# Patient Record
Sex: Female | Born: 1995 | Race: Black or African American | Hispanic: No | Marital: Single | Smoking: Never smoker
Health system: Southern US, Community
[De-identification: ages and names within clinical notes are randomized; demographics above are authoritative.]

## PROBLEM LIST (undated history)

## (undated) ENCOUNTER — Inpatient Hospital Stay (HOSPITAL_COMMUNITY): Payer: Self-pay

## (undated) DIAGNOSIS — Z789 Other specified health status: Secondary | ICD-10-CM

## (undated) DIAGNOSIS — I10 Essential (primary) hypertension: Secondary | ICD-10-CM

## (undated) DIAGNOSIS — T783XXA Angioneurotic edema, initial encounter: Secondary | ICD-10-CM

## (undated) HISTORY — DX: Angioneurotic edema, initial encounter: T78.3XXA

## (undated) HISTORY — PX: ADENOIDECTOMY: SUR15

## (undated) HISTORY — PX: TYMPANOSTOMY TUBE PLACEMENT: SHX32

## (undated) HISTORY — PX: TONSILLECTOMY: SUR1361

## (undated) HISTORY — DX: Essential (primary) hypertension: I10

---
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
## 2015-03-13 LAB — OB RESULTS CONSOLE ANTIBODY SCREEN: Antibody Screen: NEGATIVE

## 2015-03-13 LAB — OB RESULTS CONSOLE RUBELLA ANTIBODY, IGM: RUBELLA: IMMUNE

## 2015-03-13 LAB — OB RESULTS CONSOLE ABO/RH: RH TYPE: POSITIVE

## 2015-03-13 LAB — OB RESULTS CONSOLE HEPATITIS B SURFACE ANTIGEN: Hepatitis B Surface Ag: NEGATIVE

## 2015-03-13 LAB — OB RESULTS CONSOLE HIV ANTIBODY (ROUTINE TESTING): HIV: NONREACTIVE

## 2015-03-13 LAB — OB RESULTS CONSOLE RPR: RPR: NONREACTIVE

## 2015-03-16 ENCOUNTER — Inpatient Hospital Stay (HOSPITAL_COMMUNITY): Payer: Medicaid Other

## 2015-03-16 ENCOUNTER — Encounter (HOSPITAL_COMMUNITY): Payer: Self-pay | Admitting: *Deleted

## 2015-03-16 ENCOUNTER — Inpatient Hospital Stay (HOSPITAL_COMMUNITY)
Admission: AD | Admit: 2015-03-16 | Discharge: 2015-03-16 | Disposition: A | Discharge status: Home / Self Care | Payer: Medicaid Other | Source: Ambulatory Visit | Attending: Obstetrics | Admitting: Obstetrics

## 2015-03-16 DIAGNOSIS — B9689 Other specified bacterial agents as the cause of diseases classified elsewhere: Secondary | ICD-10-CM | POA: Diagnosis not present

## 2015-03-16 DIAGNOSIS — O23591 Infection of other part of genital tract in pregnancy, first trimester: Secondary | ICD-10-CM | POA: Insufficient documentation

## 2015-03-16 DIAGNOSIS — O468X1 Other antepartum hemorrhage, first trimester: Secondary | ICD-10-CM

## 2015-03-16 DIAGNOSIS — O209 Hemorrhage in early pregnancy, unspecified: Secondary | ICD-10-CM | POA: Insufficient documentation

## 2015-03-16 DIAGNOSIS — N760 Acute vaginitis: Secondary | ICD-10-CM | POA: Insufficient documentation

## 2015-03-16 DIAGNOSIS — Z3A13 13 weeks gestation of pregnancy: Secondary | ICD-10-CM | POA: Diagnosis not present

## 2015-03-16 DIAGNOSIS — O418X10 Other specified disorders of amniotic fluid and membranes, first trimester, not applicable or unspecified: Secondary | ICD-10-CM

## 2015-03-16 HISTORY — DX: Other specified health status: Z78.9

## 2015-03-16 LAB — CBC
HCT: 36 % (ref 36.0–46.0)
HEMOGLOBIN: 11.7 g/dL — AB (ref 12.0–15.0)
MCH: 23.4 pg — AB (ref 26.0–34.0)
MCHC: 32.5 g/dL (ref 30.0–36.0)
MCV: 71.9 fL — ABNORMAL LOW (ref 78.0–100.0)
PLATELETS: 157 10*3/uL (ref 150–400)
RBC: 5.01 MIL/uL (ref 3.87–5.11)
RDW: 15.8 % — ABNORMAL HIGH (ref 11.5–15.5)
WBC: 6.1 10*3/uL (ref 4.0–10.5)

## 2015-03-16 LAB — URINE MICROSCOPIC-ADD ON

## 2015-03-16 LAB — ABO/RH: ABO/RH(D): O POS

## 2015-03-16 LAB — WET PREP, GENITAL
TRICH WET PREP: NONE SEEN
Yeast Wet Prep HPF POC: NONE SEEN

## 2015-03-16 LAB — URINALYSIS, ROUTINE W REFLEX MICROSCOPIC
Bilirubin Urine: NEGATIVE
GLUCOSE, UA: NEGATIVE mg/dL
Ketones, ur: 15 mg/dL — AB
Leukocytes, UA: NEGATIVE
NITRITE: NEGATIVE
Protein, ur: NEGATIVE mg/dL
Specific Gravity, Urine: 1.015 (ref 1.005–1.030)
Urobilinogen, UA: 4 mg/dL — ABNORMAL HIGH (ref 0.0–1.0)
pH: 7.5 (ref 5.0–8.0)

## 2015-03-16 LAB — OB RESULTS CONSOLE GC/CHLAMYDIA: GC PROBE AMP, GENITAL: NEGATIVE

## 2015-03-16 MED ORDER — METRONIDAZOLE 500 MG PO TABS: 500.0000 mg | ORAL_TABLET | Freq: Two times a day (BID) | ORAL | Status: DC

## 2015-03-16 NOTE — MAU Note (Signed)
Pt presents to MAU with complaints of having vaginal bleeding two days ago. Denies any bleeding at present. Denies any pain

## 2015-03-16 NOTE — MAU Provider Note (Signed)
Chief Complaint: Vaginal Bleeding   First Provider Initiated Contact with Patient 03/16/15 1025     SUBJECTIVE HPI: Debra Lucas is a 19 y.o. G1P0 at 5268w1d who presents to [facility] reporting episode of bright red bleeding 2 days ago and still minor bleeding since then. States she passed a small clot today. Getting prenatal care with Dr. Elsie StainMarshall's office. States she has not had a pelvic exam. Has had blood drawn but doesn't know her blood type. No ultrasounds this pregnancy. No recent intercourse.  Location: Vaginal Duration: 2 days Course: Improving spontaneously Context: None Modifying factors: None Associated signs and symptoms: Positive for vaginal discharge and passage of small clots. Negative for fever, chills, passage of tissue or abdominal pain.  Past Medical History  Diagnosis Date  . Medical history non-contributory    OB History  Gravida Para Term Preterm AB SAB TAB Ectopic Multiple Living  1             # Outcome Date GA Lbr Len/2nd Weight Sex Delivery Anes PTL Lv  1 Current              Past Surgical History  Procedure Laterality Date  . Adenoidectomy     Social History   Social History  . Marital Status: Single    Spouse Name: N/A  . Number of Children: N/A  . Years of Education: N/A   Occupational History  . Not on file.   Social History Main Topics  . Smoking status: Passive Smoke Exposure - Never Smoker  . Smokeless tobacco: Not on file  . Alcohol Use: No  . Drug Use: No  . Sexual Activity: Not on file   Other Topics Concern  . Not on file   Social History Narrative   No current facility-administered medications on file prior to encounter.   No current outpatient prescriptions on file prior to encounter.   Allergies  Allergen Reactions  . Shellfish Allergy Swelling and Rash    I have reviewed the past Medical Hx, Surgical Hx, Social Hx, Allergies and Medications.   Review of Systems  Constitutional: Negative for fever  and chills.  Gastrointestinal: Negative for nausea, vomiting, abdominal pain, diarrhea, constipation and blood in stool.  Genitourinary: Positive for vaginal bleeding and vaginal discharge. Negative for dysuria, frequency, hematuria, flank pain, genital sores, vaginal pain and pelvic pain.  Musculoskeletal: Negative for back pain.    OBJECTIVE Patient Vitals for the past 24 hrs:  BP Temp Pulse Resp Height Weight  03/16/15 0956 119/67 mmHg 98.4 F (36.9 C) 105 18 - -  03/16/15 0955 - 98.4 F (36.9 C) - - 5\' 4"  (1.626 m) 148 lb (67.132 kg)   Constitutional: Well-developed, well-nourished female in no acute distress.  Cardiovascular: normal rate Respiratory: normal rate and effort.  GI: Abd soft, non-tender, gravid appropriate for gestational age.  MS: Extremities nontender, no edema, normal ROM Neurologic: Alert and oriented x 4.  GU: Neg CVAT.  SPECULUM EXAM: NEFG, small amount of tan, malodorous discharge noted, cervix clean, friable with exam  BIMANUAL: cervix closed; uterus 13-14 week size, no adnexal tenderness or masses. No CMT.  LAB RESULTS Results for orders placed or performed during the hospital encounter of 03/16/15 (from the past 24 hour(s))  ABO/Rh     Status: None (Preliminary result)   Collection Time: 03/16/15 10:30 AM  Result Value Ref Range   ABO/RH(D) O POS   CBC     Status: Abnormal   Collection Time: 03/16/15 10:30  AM  Result Value Ref Range   WBC 6.1 4.0 - 10.5 K/uL   RBC 5.01 3.87 - 5.11 MIL/uL   Hemoglobin 11.7 (L) 12.0 - 15.0 g/dL   HCT 16.1 09.6 - 04.5 %   MCV 71.9 (L) 78.0 - 100.0 fL   MCH 23.4 (L) 26.0 - 34.0 pg   MCHC 32.5 30.0 - 36.0 g/dL   RDW 40.9 (H) 81.1 - 91.4 %   Platelets 157 150 - 400 K/uL  Wet prep, genital     Status: Abnormal   Collection Time: 03/16/15 10:38 AM  Result Value Ref Range   Yeast Wet Prep HPF POC NONE SEEN NONE SEEN   Trich, Wet Prep NONE SEEN NONE SEEN   Clue Cells Wet Prep HPF POC FEW (A) NONE SEEN   WBC, Wet Prep  HPF POC FEW (A) NONE SEEN    IMAGING US Ob Comp Less 14 Wks  03/16/2015  CLINICAL DATA:  19 year old pregnant female with recent episode of vaginal bleeding. EDC by LMP: 09/20/2015, projecting to an expected gestational age of [redacted] weeks 1 day. EXAM: OBSTETRIC <14 WK ULTRASOUND TECHNIQUE: Transabdominal ultrasound was performed for evaluation of the gestation as well as the maternal uterus and adnexal regions. COMPARISON:  None. FINDINGS: Intrauterine gestational sac: Single intrauterine gestational sac appears normal in size, shape and position. There is a tiny perigestational bleed in the posterior lower cavity involving less than 20% of the gestational sac circumference. Yolk sac:  Not visualized. Fetus: Present. Fetal anatomy not assessed on this early gestational scan. No gross abnormality demonstrated. Cardiac Activity: Regular rate and rhythm. Heart Rate: 136 bpm CRL:   68  mm   13 w 1 d                  [place] EDC: 09/20/2015 Maternal uterus/adnexae: No adnexal abnormality demonstrated. Maternal ovaries are not visualized. No abnormal free fluid in the pelvis. IMPRESSION: 1. Single living intrauterine gestation at 13 weeks 1 day by crown-rump length, concordant with provided menstrual dating. 2. Normal fetal cardiac activity. Fetal anatomy not assessed on this early gestational scan. 3. Tiny perigestational bleed in the posterior lower cavity. 4. No maternal adnexal abnormality. 5. The patient should obtain a fetal anatomic survey in 5-8 weeks, at which time fetal growth will also be evaluated. Electronically Signed   By: Delbert Phenix M.D.   On: 03/16/2015 11:34    MAU COURSE CBC, ABO Rh, wet prep, GC/chlamydia cultures, ultrasound.  MDM 19 year old female at 31 weeks 1 day gestation with episode of vaginal bleeding. Minimal bleeding now. Ultrasound shows subchorionic hemorrhage. Cervix also friable on exam. Bleeding may be caused by one or both of those, but is now stable. Wet prep and exam  consistent with BV.   ASSESSMENT 1. Subchorionic hemorrhage in first trimester   2. Vaginal bleeding in pregnancy, first trimester   3. BV (bacterial vaginosis)     PLAN Discharge home in stable condition. Bleeding Precautions Pelvic rest 1 week. Rx Flagyl     Follow-up Information    Follow up with Kathreen Cosier, MD.   Specialty:  Obstetrics and Gynecology   Why:  Routine prenatal visit as scheduled   Contact information:   [address] [place] [address] [phone]       Follow up with [facility].   Why:  As needed in emergencies   Contact information:   [address] [id number] [address]  [place] [address] [phone]       Medication List    TAKE these medications        metroNIDAZOLE 500 MG tablet  Commonly known as:  FLAGYL  Take 1 tablet (500 mg total) by mouth 2 (two) times daily.     prenatal multivitamin Tabs tablet  Take 1 tablet by mouth daily at 12 noon.     promethazine 25 MG tablet  Commonly known as:  PHENERGAN  Take 25 mg by mouth every 6 (six) hours as needed for nausea or vomiting.       [place], CNM 03/16/2015  11:57 AM

## 2015-03-16 NOTE — Discharge Instructions (Signed)
Your blood type is O+  Subchorionic Hematoma A subchorionic hematoma is a gathering of blood between the outer wall of the placenta and the inner wall of the womb (uterus). The placenta is the organ that connects the fetus to the wall of the uterus. The placenta performs the feeding, breathing (oxygen to the fetus), and waste removal (excretory work) of the fetus.  Subchorionic hematoma is the most common abnormality found on a result from ultrasonography done during the first trimester or early second trimester of pregnancy. If there has been little or no vaginal bleeding, early small hematomas usually shrink on their own and do not affect your baby or pregnancy. The blood is gradually absorbed over 1-2 weeks. When bleeding starts later in pregnancy or the hematoma is larger or occurs in an older pregnant woman, the outcome may not be as good. Larger hematomas may get bigger, which increases the chances for miscarriage. Subchorionic hematoma also increases the risk of premature detachment of the placenta from the uterus, preterm (premature) labor, and stillbirth. HOME CARE INSTRUCTIONS  Stay on bed rest if your health care provider recommends this. Although bed rest will not prevent more bleeding or prevent a miscarriage, your health care provider may recommend bed rest until you are advised otherwise.  Avoid heavy lifting (more than 10 lb [4.5 kg]), exercise, sexual intercourse, or douching as directed by your health care provider.  Keep track of the number of pads you use each day and how soaked (saturated) they are. Write down this information.  Do not use tampons.  Keep all follow-up appointments as directed by your health care provider. Your health care provider may ask you to have follow-up blood tests or ultrasound tests or both. SEEK IMMEDIATE MEDICAL CARE IF:  You have severe cramps in your stomach, back, abdomen, or pelvis.  You have a fever.  You pass large clots or tissue. Save any  tissue for your health care provider to look at.  Your bleeding increases or you become lightheaded, feel weak, or have fainting episodes.   This information is not intended to replace advice given to you by your health care provider. Make sure you discuss any questions you have with your health care provider.   Document Released: 08/21/2006 Document Revised: 05/27/2014 Document Reviewed: 12/03/2012 Elsevier Interactive Patient Education 2016 Elsevier Inc.  Pelvic Rest Pelvic rest is sometimes recommended for women when:   The placenta is partially or completely covering the opening of the cervix (placenta previa).  There is bleeding between the uterine wall and the amniotic sac in the first trimester (subchorionic hemorrhage).  The cervix begins to open without labor starting (incompetent cervix, cervical insufficiency).  The labor is too early (preterm labor). HOME CARE INSTRUCTIONS  Do not have sexual intercourse, stimulation, or an orgasm.  Do not use tampons, douche, or put anything in the vagina.  Do not lift anything over 10 pounds (4.5 kg).  Avoid strenuous activity or straining your pelvic muscles. SEEK MEDICAL CARE IF:  You have any vaginal bleeding during pregnancy. Treat this as a potential emergency.  You have cramping pain felt low in the stomach (stronger than menstrual cramps).  You notice vaginal discharge (watery, mucus, or bloody).  You have a low, dull backache.  There are regular contractions or uterine tightening. SEEK IMMEDIATE MEDICAL CARE IF: You have vaginal bleeding and have placenta previa.    This information is not intended to replace advice given to you by your health care provider. Make sure  you discuss any questions you have with your health care provider.   Document Released: 08/31/2010 Document Revised: 07/29/2011 Document Reviewed: 11/07/2014 Elsevier Interactive Patient Education 2016 Elsevier Inc.  Bacterial Vaginosis Bacterial  vaginosis is a vaginal infection that occurs when the normal balance of bacteria in the vagina is disrupted. It results from an overgrowth of certain bacteria. This is the most common vaginal infection in women of childbearing age. Treatment is important to prevent complications, especially in pregnant women, as it can cause a premature delivery. CAUSES  Bacterial vaginosis is caused by an increase in harmful bacteria that are normally present in smaller amounts in the vagina. Several different kinds of bacteria can cause bacterial vaginosis. However, the reason that the condition develops is not fully understood. RISK FACTORS Certain activities or behaviors can put you at an increased risk of developing bacterial vaginosis, including:  Having a new sex partner or multiple sex partners.  Douching.  Using an intrauterine device (IUD) for contraception. Women do not get bacterial vaginosis from toilet seats, bedding, swimming pools, or contact with objects around them. SIGNS AND SYMPTOMS  Some women with bacterial vaginosis have no signs or symptoms. Common symptoms include:  Grey vaginal discharge.  A fishlike odor with discharge, especially after sexual intercourse.  Itching or burning of the vagina and vulva.  Burning or pain with urination. DIAGNOSIS  Your health care provider will take a medical history and examine the vagina for signs of bacterial vaginosis. A sample of vaginal fluid may be taken. Your health care provider will look at this sample under a microscope to check for bacteria and abnormal cells. A vaginal pH test may also be done.  TREATMENT  Bacterial vaginosis may be treated with antibiotic medicines. These may be given in the form of a pill or a vaginal cream. A second round of antibiotics may be prescribed if the condition comes back after treatment. Because bacterial vaginosis increases your risk for sexually transmitted diseases, getting treated can help reduce your  risk for chlamydia, gonorrhea, HIV, and herpes. HOME CARE INSTRUCTIONS   Only take over-the-counter or prescription medicines as directed by your health care provider.  If antibiotic medicine was prescribed, take it as directed. Make sure you finish it even if you start to feel better.  Tell all sexual partners that you have a vaginal infection. They should see their health care provider and be treated if they have problems, such as a mild rash or itching.  During treatment, it is important that you follow these instructions:  Avoid sexual activity or use condoms correctly.  Do not douche.  Avoid alcohol as directed by your health care provider.  Avoid breastfeeding as directed by your health care provider. SEEK MEDICAL CARE IF:   Your symptoms are not improving after 3 days of treatment.  You have increased discharge or pain.  You have a fever. MAKE SURE YOU:   Understand these instructions.  Will watch your condition.  Will get help right away if you are not doing well or get worse. FOR MORE INFORMATION  Centers for Disease Control and Prevention, Division of STD Prevention: [employer]za American Sexual Health Association (ASHA): www.ashastd.org    This information is not intended to replace advice given to you by your health care provider. Make sure you discuss any questions you have with your health care provider.   Document Released: 05/06/2005 Document Revised: 05/27/2014 Document Reviewed: 12/16/2012 Elsevier Interactive Patient Education [employer]

## 2015-03-17 LAB — GC/CHLAMYDIA PROBE AMP (~~LOC~~) NOT AT ARMC
CHLAMYDIA, DNA PROBE: NEGATIVE
Neisseria Gonorrhea: NEGATIVE

## 2015-05-21 DIAGNOSIS — O09299 Supervision of pregnancy with other poor reproductive or obstetric history, unspecified trimester: Secondary | ICD-10-CM

## 2015-05-21 HISTORY — DX: Supervision of pregnancy with other poor reproductive or obstetric history, unspecified trimester: O09.299

## 2015-05-21 NOTE — L&D Delivery Note (Addendum)
Delivery Note At 8:39 AM a viable female was delivered via Vaginal, Spontaneous Delivery (Presentation: ; Occiput Anterior).  APGAR: , ; weight  .   Placenta status: Intact, Spontaneous.  Cord: 3 vessels with the following complications: None.  Cord pH: not done  Anesthesia: Epidural  Episiotomy: None Lacerations:   Suture Repair: Est. Blood Loss (mL):    Mom to postpartum.  Baby to Couplet care / Skin to Skin.  MARSHALL,BERNARD A 09/19/2015, 8:49 AM   No suture mentioned there was no laceration

## 2015-06-03 ENCOUNTER — Emergency Department (HOSPITAL_COMMUNITY)
Admission: EM | Admit: 2015-06-03 | Discharge: 2015-06-03 | Disposition: A | Discharge status: Home / Self Care | Payer: Medicaid Other | Attending: Emergency Medicine | Admitting: Emergency Medicine

## 2015-06-03 ENCOUNTER — Encounter (HOSPITAL_COMMUNITY): Payer: Self-pay

## 2015-06-03 DIAGNOSIS — K029 Dental caries, unspecified: Secondary | ICD-10-CM | POA: Insufficient documentation

## 2015-06-03 DIAGNOSIS — O99612 Diseases of the digestive system complicating pregnancy, second trimester: Secondary | ICD-10-CM | POA: Diagnosis not present

## 2015-06-03 DIAGNOSIS — Z792 Long term (current) use of antibiotics: Secondary | ICD-10-CM | POA: Diagnosis not present

## 2015-06-03 DIAGNOSIS — Z3A24 24 weeks gestation of pregnancy: Secondary | ICD-10-CM | POA: Insufficient documentation

## 2015-06-03 DIAGNOSIS — K047 Periapical abscess without sinus: Secondary | ICD-10-CM

## 2015-06-03 DIAGNOSIS — O9989 Other specified diseases and conditions complicating pregnancy, childbirth and the puerperium: Secondary | ICD-10-CM | POA: Diagnosis present

## 2015-06-03 DIAGNOSIS — Z79899 Other long term (current) drug therapy: Secondary | ICD-10-CM | POA: Insufficient documentation

## 2015-06-03 MED ORDER — HYDROCODONE-ACETAMINOPHEN 5-325 MG PO TABS
1.0000 | ORAL_TABLET | Freq: Once | ORAL | Status: AC
  Administered 2015-06-03: 1 via ORAL
  Filled 2015-06-03: qty 1

## 2015-06-03 MED ORDER — HYDROCODONE-ACETAMINOPHEN 5-325 MG PO TABS: 1.0000 | ORAL_TABLET | Freq: Three times a day (TID) | ORAL | Status: DC | PRN

## 2015-06-03 MED ORDER — AMOXICILLIN 500 MG PO CAPS: 500.0000 mg | ORAL_CAPSULE | Freq: Three times a day (TID) | ORAL | Status: DC

## 2015-06-03 MED ORDER — AMOXICILLIN 500 MG PO CAPS
500.0000 mg | ORAL_CAPSULE | Freq: Once | ORAL | Status: AC
  Administered 2015-06-03: 500 mg via ORAL
  Filled 2015-06-03: qty 1

## 2015-06-03 NOTE — ED Notes (Addendum)
Pt reports dental pain to left upper tooth, parts of it are broken off. Pain onset yesterday after eating. Pt is [redacted] weeks pregnant but denies any problems with pregnancy and denies abdominal pain.

## 2015-06-03 NOTE — ED Notes (Signed)
OB Rapid response aware of pt.

## 2015-06-03 NOTE — ED Provider Notes (Signed)
CSN: [id number]     Arrival date & time 06/03/15  2117 History   First MD Initiated Contact with Patient 06/03/15 2126     Chief Complaint  Patient presents with  . Dental Pain     (Consider location/radiation/quality/duration/timing/severity/associated sxs/prior Treatment) Patient is a 20 y.o. female presenting with tooth pain. The history is provided by the patient. No language interpreter was used.  Dental Pain Location:  Lower Lower teeth location:  19/LL 1st molar Quality:  Throbbing Severity:  Severe Onset quality:  Gradual Duration:  2 days Timing:  Constant Progression:  Worsening Chronicity:  New Context: abscess and dental caries   Relieved by:  Nothing Worsened by:  Cold food/drink Ineffective treatments:  None tried Associated symptoms: facial pain, facial swelling and gum swelling    Debra Lucas is a 20 y.o. G1P0 @ [redacted]w[redacted]d gestation who presents to the ED with dental pain. The pain in in the left lower dental area. The tooth is broken. Pain started yesterday after eating. Patient getting prenatal care with Dr. Gaynell Face. Patient denies any OB problems.   Past Medical History  Diagnosis Date  . Medical history non-contributory    Past Surgical History  Procedure Laterality Date  . Adenoidectomy     No family history on file. Social History  Substance Use Topics  . Smoking status: Passive Smoke Exposure - Never Smoker  . Smokeless tobacco: None  . Alcohol Use: No   OB History    Gravida Para Term Preterm AB TAB SAB Ectopic Multiple Living   1              Review of Systems  HENT: Positive for dental problem and facial swelling.   Genitourinary:       [redacted] weeks gestation  all other systems negative    Allergies  Shellfish allergy  Home Medications   Prior to Admission medications   Medication Sig Start Date End Date Taking? Authorizing Provider  amoxicillin (AMOXIL) 500 MG capsule Take 1 capsule (500 mg total) by mouth 3 (three) times  daily. 06/03/15   Hope Orlene Och, NP  HYDROcodone-acetaminophen (NORCO) 5-325 MG tablet Take 1 tablet by mouth every 8 (eight) hours as needed. 06/03/15   Hope Orlene Och, NP  metroNIDAZOLE (FLAGYL) 500 MG tablet Take 1 tablet (500 mg total) by mouth 2 (two) times daily. 03/16/15   Dorathy Kinsman, CNM  Prenatal Vit-Fe Fumarate-FA (PRENATAL MULTIVITAMIN) TABS tablet Take 1 tablet by mouth daily at 12 noon.    Historical Provider, MD  promethazine (PHENERGAN) 25 MG tablet Take 25 mg by mouth every 6 (six) hours as needed for nausea or vomiting.    Historical Provider, MD   BP 131/67 mmHg  Pulse 92  Temp(Src) 98.8 F (37.1 C) (Oral)  Resp 18  SpO2 100%  LMP 12/14/2014 Physical Exam  Constitutional: She is oriented to person, place, and time. She appears well-developed and well-nourished.  HENT:  Head: Normocephalic.  Mouth/Throat: Uvula is midline and oropharynx is clear and moist. Dental abscesses and dental caries present.    Swelling to the left lower gum and face. Left lower first molar with decay to the gumline. Tender on exam. Second molar left lower with decay.   Eyes: Conjunctivae and EOM are normal.  Neck: Neck supple.  Cardiovascular: Normal rate and regular rhythm.   Pulmonary/Chest: Effort normal and breath sounds normal.  Abdominal: Soft.  + FHT 150  Musculoskeletal: Normal range of motion.  Neurological: She is alert and oriented  to person, place, and time. No cranial nerve deficit.  Skin: Skin is warm and dry.  Psychiatric: She has a normal mood and affect. Her behavior is normal.  Nursing note and vitals reviewed.   ED Course  Procedures (including critical care time) Labs Review  MDM  20 y.o. female with dental pain due to abscess. Stable for d/c without trismus, no difficulty swallowing, no fever and does not appear toxic. Will treat with antibiotics and pain medication and she will see a dentist as soon as possible.   Final diagnoses:  Dental abscess         Janne NapoleonHope M Neese, NP 06/04/15 2357  Linwood DibblesJon Knapp, MD 06/06/15 1027

## 2015-06-03 NOTE — ED Notes (Signed)
Pt ambulates independently at time of discharge with steady gait. Discharge instructions and follow up information reviewed with pt and family. No other questions or concerns voiced at this time. RX x 2 given.

## 2015-06-03 NOTE — Discharge Instructions (Signed)
It is important that you see a dentist as soon as possible.   Dental Abscess A dental abscess is a collection of pus in or around a tooth. CAUSES This condition is caused by a bacterial infection around the root of the tooth that involves the inner part of the tooth (pulp). It may result from:  Severe tooth decay.  Trauma to the tooth that allows bacteria to enter into the pulp, such as a broken or chipped tooth.  Severe gum disease around a tooth. SYMPTOMS Symptoms of this condition include:  Severe pain in and around the infected tooth.  Swelling and redness around the infected tooth, in the mouth, or in the face.  Tenderness.  Pus drainage.  Bad breath.  Bitter taste in the mouth.  Difficulty swallowing.  Difficulty opening the mouth.  Nausea.  Vomiting.  Chills.  Swollen neck glands.  Fever. DIAGNOSIS This condition is diagnosed with examination of the infected tooth. During the exam, your dentist may tap on the infected tooth. Your dentist will also ask about your medical and dental history and may order X-rays. TREATMENT This condition is treated by eliminating the infection. This may be done with:  Antibiotic medicine.  A root canal. This may be performed to save the tooth.  Pulling (extracting) the tooth. This may also involve draining the abscess. This is done if the tooth cannot be saved. HOME CARE INSTRUCTIONS  Take medicines only as directed by your dentist.  If you were prescribed antibiotic medicine, finish all of it even if you start to feel better.  Rinse your mouth (gargle) often with salt water to relieve pain or swelling.  Do not drive or operate heavy machinery while taking pain medicine.  Do not apply heat to the outside of your mouth.  Keep all follow-up visits as directed by your dentist. This is important. SEEK MEDICAL CARE IF:  Your pain is worse and is not helped by medicine. SEEK IMMEDIATE MEDICAL CARE IF:  You have a  fever or chills.  Your symptoms suddenly get worse.  You have a very bad headache.  You have problems breathing or swallowing.  You have trouble opening your mouth.  You have swelling in your neck or around your eye.   This information is not intended to replace advice given to you by your health care provider. Make sure you discuss any questions you have with your health care provider.   Document Released: 05/06/2005 Document Revised: 09/20/2014 Document Reviewed: 05/03/2014 Elsevier Interactive Patient Education [employer]2016 Elsevier Inc.

## 2015-06-14 ENCOUNTER — Encounter (HOSPITAL_COMMUNITY): Payer: Self-pay | Admitting: *Deleted

## 2015-06-14 ENCOUNTER — Inpatient Hospital Stay (HOSPITAL_COMMUNITY)
Admission: AD | Admit: 2015-06-14 | Discharge: 2015-06-14 | Disposition: A | Discharge status: Home / Self Care | Payer: Medicaid Other | Source: Ambulatory Visit | Attending: Obstetrics | Admitting: Obstetrics

## 2015-06-14 DIAGNOSIS — N859 Noninflammatory disorder of uterus, unspecified: Secondary | ICD-10-CM | POA: Diagnosis not present

## 2015-06-14 DIAGNOSIS — O4702 False labor before 37 completed weeks of gestation, second trimester: Secondary | ICD-10-CM | POA: Insufficient documentation

## 2015-06-14 DIAGNOSIS — N858 Other specified noninflammatory disorders of uterus: Secondary | ICD-10-CM

## 2015-06-14 DIAGNOSIS — O9989 Other specified diseases and conditions complicating pregnancy, childbirth and the puerperium: Secondary | ICD-10-CM | POA: Diagnosis not present

## 2015-06-14 DIAGNOSIS — Z3A26 26 weeks gestation of pregnancy: Secondary | ICD-10-CM | POA: Diagnosis not present

## 2015-06-14 LAB — URINALYSIS, ROUTINE W REFLEX MICROSCOPIC
BILIRUBIN URINE: NEGATIVE
Glucose, UA: NEGATIVE mg/dL
HGB URINE DIPSTICK: NEGATIVE
KETONES UR: NEGATIVE mg/dL
Leukocytes, UA: NEGATIVE
NITRITE: NEGATIVE
PROTEIN: NEGATIVE mg/dL
Specific Gravity, Urine: 1.015 (ref 1.005–1.030)
pH: 7.5 (ref 5.0–8.0)

## 2015-06-14 LAB — FETAL FIBRONECTIN: FETAL FIBRONECTIN: NEGATIVE

## 2015-06-14 NOTE — Discharge Instructions (Signed)

## 2015-06-14 NOTE — MAU Note (Signed)
Urine in lab 

## 2015-06-14 NOTE — MAU Note (Signed)
Had abd cramping yesterday morning, last night and again this morning.  (lasts about 10 min).  Baby has been moving, feels like she is vibrating.

## 2015-06-14 NOTE — MAU Provider Note (Signed)
Chief Complaint:  Abdominal Cramping   First Provider Initiated Contact with Patient 06/14/15 1316     HPI: Debra Lucas is a 20 y.o. G1P0 at 62w0dwho presents to maternity admissions reporting intermittent abdominal cramping. States it happens 2-3 times per day   Started a few days ago. Has not had any cramping today. . She reports good fetal movement, denies LOF, vaginal bleeding, vaginal itching/burning, urinary symptoms, h/a, dizziness, n/v, diarrhea, constipation or fever/chills.  She denies headache, visual changes.  Abdominal Cramping This is a new problem. The current episode started in the past 7 days. The onset quality is gradual. The problem occurs intermittently (2-3 times per day). The problem has been waxing and waning. The pain is located in the suprapubic region. The pain is mild. The quality of the pain is cramping. The abdominal pain does not radiate. Pertinent negatives include no constipation (but did feel better after having BM), diarrhea, dysuria, fever, frequency, headaches, myalgias, nausea or vomiting. Nothing aggravates the pain. The pain is relieved by bowel movements. She has tried nothing for the symptoms.   RN Note: Had abd cramping yesterday morning, last night and again this morning. (lasts about 10 min). Baby has been moving, feels like she is vibrating.        Past Medical History: Past Medical History  Diagnosis Date  . Medical history non-contributory     Past obstetric history: OB History  Gravida Para Term Preterm AB SAB TAB Ectopic Multiple Living  1             # Outcome Date GA Lbr Len/2nd Weight Sex Delivery Anes PTL Lv  1 Current               Past Surgical History: Past Surgical History  Procedure Laterality Date  . Adenoidectomy      Family History: History reviewed. No pertinent family history.  Social History: Social History  Substance Use Topics  . Smoking status: Passive Smoke Exposure - Never Smoker  . Smokeless  tobacco: None  . Alcohol Use: No    Allergies:  Allergies  Allergen Reactions  . Shellfish Allergy Swelling and Rash    Meds:  Prescriptions prior to admission  Medication Sig Dispense Refill Last Dose  . amoxicillin (AMOXIL) 500 MG capsule Take 1 capsule (500 mg total) by mouth 3 (three) times daily. 21 capsule 0   . HYDROcodone-acetaminophen (NORCO) 5-325 MG tablet Take 1 tablet by mouth every 8 (eight) hours as needed. 6 tablet 0   . metroNIDAZOLE (FLAGYL) 500 MG tablet Take 1 tablet (500 mg total) by mouth 2 (two) times daily. 14 tablet 0   . Prenatal Vit-Fe Fumarate-FA (PRENATAL MULTIVITAMIN) TABS tablet Take 1 tablet by mouth daily at 12 noon.   03/15/2015 at Unknown time  . promethazine (PHENERGAN) 25 MG tablet Take 25 mg by mouth every 6 (six) hours as needed for nausea or vomiting.   Past Month at Unknown time    I have reviewed patient's Past Medical Hx, Surgical Hx, Family Hx, Social Hx, medications and allergies.   ROS:  Review of Systems  Constitutional: Negative for fever and chills.  Gastrointestinal: Positive for abdominal pain (yesterday). Negative for nausea, vomiting, diarrhea and constipation (but did feel better after having BM).  Genitourinary: Positive for pelvic pain. Negative for dysuria, frequency, flank pain, vaginal bleeding and vaginal discharge.  Musculoskeletal: Negative for myalgias and back pain.  Neurological: Negative for headaches.  Other systems negative   Physical Exam  Patient Vitals for the past 24 hrs:  BP Temp Temp src Pulse Resp Weight  06/14/15 1231 114/72 mmHg 98.7 F (37.1 C) Oral 103 16 159 lb 3.2 oz (72.213 kg)   Constitutional: Well-developed, well-nourished female in no acute distress.  Cardiovascular: normal rate and rhythm Respiratory: normal effort, clear to auscultate bilaterally GI: Abd soft, non-tender, gravid appropriate for gestational age.   No rebound or guarding. MS: Extremities nontender, no edema, normal  ROM Neurologic: Alert and oriented x 4.  GU: Neg CVAT.  PELVIC EXAM: Cervix firm, posterior, neg CMT, uterus nontender, Fundal Height consistent with dates, adnexa without tenderness, enlargement, or mass  Dilation: Closed Effacement (%): Thick Station: Ballotable Exam by:: Beather Arbour  FHT:  Baseline 140 , moderate variability, accelerations present, no decelerations Contractions:  Few Irregular, most not felt by patient   Labs: Results for orders placed or performed during the hospital encounter of 06/14/15 (from the past 24 hour(s))  Urinalysis, Routine w reflex microscopic (not at [facility])     Status: Abnormal   Collection Time: 06/14/15 12:20 PM  Result Value Ref Range   Color, Urine YELLOW YELLOW   APPearance HAZY (A) CLEAR   Specific Gravity, Urine 1.015 1.005 - 1.030   pH 7.5 5.0 - 8.0   Glucose, UA NEGATIVE NEGATIVE mg/dL   Hgb urine dipstick NEGATIVE NEGATIVE   Bilirubin Urine NEGATIVE NEGATIVE   Ketones, ur NEGATIVE NEGATIVE mg/dL   Protein, ur NEGATIVE NEGATIVE mg/dL   Nitrite NEGATIVE NEGATIVE   Leukocytes, UA NEGATIVE NEGATIVE  Fetal fibronectin     Status: None   Collection Time: 06/14/15  1:10 PM  Result Value Ref Range   Fetal Fibronectin NEGATIVE NEGATIVE     Imaging:  No results found.  MAU Course/MDM: I have ordered labs and reviewed results.  NST reviewed    Pt stable at time of discharge.   Assessment: Intermittent Braxton Hicks contractions No change in cervix Not in labor  Plan: Discharge home Preterm Labor precautions and fetal kick counts Follow up in Office for prenatal visits and recheck    Medication List    ASK your doctor about these medications        amoxicillin 500 MG capsule  Commonly known as:  AMOXIL  Take 1 capsule (500 mg total) by mouth 3 (three) times daily.     HYDROcodone-acetaminophen 5-325 MG tablet  Commonly known as:  NORCO  Take 1 tablet by mouth every 8 (eight) hours as needed.     metroNIDAZOLE  500 MG tablet  Commonly known as:  FLAGYL  Take 1 tablet (500 mg total) by mouth 2 (two) times daily.     prenatal multivitamin Tabs tablet  Take 1 tablet by mouth daily at 12 noon.     promethazine 25 MG tablet  Commonly known as:  PHENERGAN  Take 25 mg by mouth every 6 (six) hours as needed for nausea or vomiting.        Wynelle Bourgeois CNM, MSN Certified Nurse-Midwife 06/14/2015 1:16 PM

## 2015-08-17 LAB — OB RESULTS CONSOLE GBS: STREP GROUP B AG: POSITIVE

## 2015-09-18 ENCOUNTER — Inpatient Hospital Stay (HOSPITAL_COMMUNITY)
Admission: AD | Admit: 2015-09-18 | Discharge: 2015-09-21 | DRG: 775 | Disposition: A | Discharge status: Home / Self Care | Payer: Medicaid Other | Source: Ambulatory Visit | Attending: Obstetrics | Admitting: Obstetrics

## 2015-09-18 ENCOUNTER — Encounter (HOSPITAL_COMMUNITY): Payer: Self-pay

## 2015-09-18 DIAGNOSIS — O164 Unspecified maternal hypertension, complicating childbirth: Principal | ICD-10-CM | POA: Diagnosis present

## 2015-09-18 DIAGNOSIS — O1493 Unspecified pre-eclampsia, third trimester: Secondary | ICD-10-CM | POA: Diagnosis present

## 2015-09-18 DIAGNOSIS — Z3A39 39 weeks gestation of pregnancy: Secondary | ICD-10-CM | POA: Diagnosis not present

## 2015-09-18 LAB — CBC
HCT: 37.7 % (ref 36.0–46.0)
Hemoglobin: 12.4 g/dL (ref 12.0–15.0)
MCH: 23.9 pg — ABNORMAL LOW (ref 26.0–34.0)
MCHC: 32.9 g/dL (ref 30.0–36.0)
MCV: 72.8 fL — ABNORMAL LOW (ref 78.0–100.0)
PLATELETS: 132 10*3/uL — AB (ref 150–400)
RBC: 5.18 MIL/uL — AB (ref 3.87–5.11)
RDW: 15 % (ref 11.5–15.5)
WBC: 6.7 10*3/uL (ref 4.0–10.5)

## 2015-09-18 LAB — COMPREHENSIVE METABOLIC PANEL
ALBUMIN: 3.1 g/dL — AB (ref 3.5–5.0)
ALT: 10 U/L — AB (ref 14–54)
AST: 25 U/L (ref 15–41)
Alkaline Phosphatase: 106 U/L (ref 38–126)
Anion gap: 9 (ref 5–15)
BUN: 6 mg/dL (ref 6–20)
CHLORIDE: 107 mmol/L (ref 101–111)
CO2: 22 mmol/L (ref 22–32)
CREATININE: 0.71 mg/dL (ref 0.44–1.00)
Calcium: 8.9 mg/dL (ref 8.9–10.3)
GFR calc Af Amer: >60 mL/min (ref >60)
Glucose, Bld: 74 mg/dL (ref 65–99)
POTASSIUM: 3.7 mmol/L (ref 3.5–5.1)
SODIUM: 138 mmol/L (ref 135–145)
Total Bilirubin: 0.9 mg/dL (ref 0.3–1.2)
Total Protein: 6.4 g/dL — ABNORMAL LOW (ref 6.5–8.1)

## 2015-09-18 LAB — PROTEIN / CREATININE RATIO, URINE
CREATININE, URINE: 195 mg/dL
Protein Creatinine Ratio: 7.1 mg/mg{Cre} — ABNORMAL HIGH (ref 0.00–0.15)
Total Protein, Urine: 1385 mg/dL

## 2015-09-18 LAB — TYPE AND SCREEN
ABO/RH(D): O POS
Antibody Screen: NEGATIVE

## 2015-09-18 LAB — MRSA PCR SCREENING: MRSA by PCR: NEGATIVE

## 2015-09-18 MED ORDER — ONDANSETRON HCL 4 MG/2ML IJ SOLN
4.0000 mg | Freq: Four times a day (QID) | INTRAMUSCULAR | Status: DC | PRN
  Administered 2015-09-18: 4 mg via INTRAVENOUS
  Filled 2015-09-18: qty 2

## 2015-09-18 MED ORDER — OXYTOCIN 10 UNIT/ML IJ SOLN
1.0000 m[IU]/min | INTRAVENOUS | Status: DC
  Administered 2015-09-18: 2 m[IU]/min via INTRAVENOUS

## 2015-09-18 MED ORDER — MAGNESIUM SULFATE 50 % IJ SOLN
2.0000 g/h | INTRAVENOUS | Status: DC
  Administered 2015-09-18: 2 g/h via INTRAVENOUS
  Filled 2015-09-18: qty 80

## 2015-09-18 MED ORDER — LABETALOL HCL 5 MG/ML IV SOLN
20.0000 mg | INTRAVENOUS | Status: AC | PRN
  Administered 2015-09-18 – 2015-09-19 (×2): 20 mg via INTRAVENOUS
  Filled 2015-09-18 (×3): qty 4

## 2015-09-18 MED ORDER — OXYTOCIN 10 UNIT/ML IJ SOLN
2.5000 [IU]/h | INTRAMUSCULAR | Status: DC
  Filled 2015-09-18: qty 4

## 2015-09-18 MED ORDER — PENICILLIN G POTASSIUM 5000000 UNITS IJ SOLR
2.5000 10*6.[IU] | INTRAVENOUS | Status: DC
  Administered 2015-09-19 (×2): 2.5 10*6.[IU] via INTRAVENOUS
  Filled 2015-09-18 (×3): qty 2.5

## 2015-09-18 MED ORDER — ACETAMINOPHEN 325 MG PO TABS
650.0000 mg | ORAL_TABLET | ORAL | Status: DC | PRN
Start: 2015-09-18 — End: 2015-09-19

## 2015-09-18 MED ORDER — CITRIC ACID-SODIUM CITRATE 334-500 MG/5ML PO SOLN: 30.0000 mL | ORAL | Status: DC | PRN

## 2015-09-18 MED ORDER — HYDRALAZINE HCL 20 MG/ML IJ SOLN
5.0000 mg | INTRAMUSCULAR | Status: DC | PRN
  Administered 2015-09-19: 10 mg via INTRAVENOUS
  Filled 2015-09-18: qty 1

## 2015-09-18 MED ORDER — OXYCODONE-ACETAMINOPHEN 5-325 MG PO TABS: 1.0000 | ORAL_TABLET | ORAL | Status: DC | PRN

## 2015-09-18 MED ORDER — SODIUM CHLORIDE 0.9 % IV SOLN: 250.0000 mL | INTRAVENOUS | Status: DC | PRN

## 2015-09-18 MED ORDER — OXYTOCIN 10 UNIT/ML IJ SOLN: 10.0000 [IU] | Freq: Once | INTRAMUSCULAR | Status: DC

## 2015-09-18 MED ORDER — LIDOCAINE HCL (PF) 1 % IJ SOLN
30.0000 mL | INTRAMUSCULAR | Status: DC | PRN
  Filled 2015-09-18: qty 30

## 2015-09-18 MED ORDER — MAGNESIUM SULFATE BOLUS VIA INFUSION
4.0000 g | Freq: Once | INTRAVENOUS | Status: AC
  Administered 2015-09-18: 4 g via INTRAVENOUS
  Filled 2015-09-18: qty 500

## 2015-09-18 MED ORDER — FENTANYL CITRATE (PF) 100 MCG/2ML IJ SOLN
50.0000 ug | INTRAMUSCULAR | Status: DC | PRN
  Administered 2015-09-18: 100 ug via INTRAVENOUS
  Filled 2015-09-18: qty 2

## 2015-09-18 MED ORDER — LACTATED RINGERS IV SOLN
500.0000 mL | INTRAVENOUS | Status: DC | PRN
  Administered 2015-09-19: 250 mL via INTRAVENOUS

## 2015-09-18 MED ORDER — HYDROXYZINE HCL 50 MG PO TABS: 50.0000 mg | ORAL_TABLET | Freq: Four times a day (QID) | ORAL | Status: DC | PRN

## 2015-09-18 MED ORDER — PENICILLIN G POTASSIUM 5000000 UNITS IJ SOLR: 2.5000 10*6.[IU] | INTRAVENOUS | Status: DC

## 2015-09-18 MED ORDER — SODIUM CHLORIDE 0.9% FLUSH: 3.0000 mL | Freq: Two times a day (BID) | INTRAVENOUS | Status: DC

## 2015-09-18 MED ORDER — OXYCODONE-ACETAMINOPHEN 5-325 MG PO TABS: 2.0000 | ORAL_TABLET | ORAL | Status: DC | PRN

## 2015-09-18 MED ORDER — PENICILLIN G POTASSIUM 5000000 UNITS IJ SOLR
5.0000 10*6.[IU] | Freq: Once | INTRAVENOUS | Status: AC
  Administered 2015-09-18: 5 10*6.[IU] via INTRAVENOUS
  Filled 2015-09-18: qty 5

## 2015-09-18 MED ORDER — LACTATED RINGERS IV SOLN: INTRAVENOUS | Status: DC

## 2015-09-18 MED ORDER — SODIUM CHLORIDE 0.9% FLUSH: 3.0000 mL | INTRAVENOUS | Status: DC | PRN

## 2015-09-18 MED ORDER — OXYTOCIN BOLUS FROM INFUSION: 500.0000 mL | INTRAVENOUS | Status: DC

## 2015-09-19 ENCOUNTER — Encounter (HOSPITAL_COMMUNITY): Payer: Self-pay | Admitting: *Deleted

## 2015-09-19 ENCOUNTER — Inpatient Hospital Stay (HOSPITAL_COMMUNITY): Payer: Medicaid Other | Admitting: Anesthesiology

## 2015-09-19 LAB — CBC
HEMATOCRIT: 36.6 % (ref 36.0–46.0)
HEMATOCRIT: 37.2 % (ref 36.0–46.0)
HEMOGLOBIN: 12.2 g/dL (ref 12.0–15.0)
HEMOGLOBIN: 12.3 g/dL (ref 12.0–15.0)
MCH: 24.1 pg — AB (ref 26.0–34.0)
MCH: 24.3 pg — AB (ref 26.0–34.0)
MCHC: 33.1 g/dL (ref 30.0–36.0)
MCHC: 33.3 g/dL (ref 30.0–36.0)
MCV: 72.8 fL — AB (ref 78.0–100.0)
MCV: 72.9 fL — AB (ref 78.0–100.0)
Platelets: 127 10*3/uL — ABNORMAL LOW (ref 150–400)
Platelets: 138 10*3/uL — ABNORMAL LOW (ref 150–400)
RBC: 5.03 MIL/uL (ref 3.87–5.11)
RBC: 5.1 MIL/uL (ref 3.87–5.11)
RDW: 15.1 % (ref 11.5–15.5)
RDW: 15.4 % (ref 11.5–15.5)
WBC: 18.3 10*3/uL — ABNORMAL HIGH (ref 4.0–10.5)
WBC: 8 10*3/uL (ref 4.0–10.5)

## 2015-09-19 LAB — RPR: RPR Ser Ql: NONREACTIVE

## 2015-09-19 MED ORDER — OXYTOCIN 10 UNIT/ML IJ SOLN
2.5000 [IU]/h | INTRAMUSCULAR | Status: DC
  Filled 2015-09-19: qty 4

## 2015-09-19 MED ORDER — ONDANSETRON HCL 4 MG/2ML IJ SOLN: 4.0000 mg | INTRAMUSCULAR | Status: DC | PRN

## 2015-09-19 MED ORDER — EPHEDRINE 5 MG/ML INJ: 10.0000 mg | INTRAVENOUS | Status: DC | PRN

## 2015-09-19 MED ORDER — MAGNESIUM SULFATE 50 % IJ SOLN
2.0000 g/h | INTRAVENOUS | Status: DC
  Administered 2015-09-19: 2 g/h via INTRAVENOUS
  Filled 2015-09-19: qty 80

## 2015-09-19 MED ORDER — MISOPROSTOL 200 MCG PO TABS
ORAL_TABLET | ORAL | Status: AC
Start: 2015-09-19 — End: 2015-09-19
  Administered 2015-09-19: 800 ug
  Filled 2015-09-19: qty 4

## 2015-09-19 MED ORDER — ACETAMINOPHEN 325 MG PO TABS
650.0000 mg | ORAL_TABLET | ORAL | Status: DC | PRN
  Administered 2015-09-21: 650 mg via ORAL
  Filled 2015-09-19: qty 2

## 2015-09-19 MED ORDER — LABETALOL HCL 200 MG PO TABS
200.0000 mg | ORAL_TABLET | Freq: Three times a day (TID) | ORAL | Status: DC
  Administered 2015-09-19 – 2015-09-20 (×3): 200 mg via ORAL
  Filled 2015-09-19 (×4): qty 1

## 2015-09-19 MED ORDER — SIMETHICONE 80 MG PO CHEW: 80.0000 mg | CHEWABLE_TABLET | ORAL | Status: DC | PRN

## 2015-09-19 MED ORDER — SENNOSIDES-DOCUSATE SODIUM 8.6-50 MG PO TABS
2.0000 | ORAL_TABLET | ORAL | Status: DC
  Administered 2015-09-20 (×2): 2 via ORAL
  Filled 2015-09-19 (×2): qty 2

## 2015-09-19 MED ORDER — IBUPROFEN 600 MG PO TABS
600.0000 mg | ORAL_TABLET | Freq: Four times a day (QID) | ORAL | Status: DC
  Administered 2015-09-19 – 2015-09-21 (×8): 600 mg via ORAL
  Filled 2015-09-19 (×8): qty 1

## 2015-09-19 MED ORDER — FUROSEMIDE NICU IV SYRINGE 10 MG/ML: 20.0000 mg | Freq: Once | INTRAMUSCULAR | Status: DC

## 2015-09-19 MED ORDER — FUROSEMIDE 10 MG/ML IJ SOLN
20.0000 mg | Freq: Once | INTRAMUSCULAR | Status: AC | PRN
  Administered 2015-09-20: 20 mg via INTRAVENOUS
  Filled 2015-09-19: qty 2

## 2015-09-19 MED ORDER — WITCH HAZEL-GLYCERIN EX PADS: 1.0000 "application " | MEDICATED_PAD | CUTANEOUS | Status: DC | PRN

## 2015-09-19 MED ORDER — BENZOCAINE-MENTHOL 20-0.5 % EX AERO: 1.0000 "application " | INHALATION_SPRAY | CUTANEOUS | Status: DC | PRN

## 2015-09-19 MED ORDER — LACTATED RINGERS IV SOLN: 500.0000 mL | Freq: Once | INTRAVENOUS | Status: AC

## 2015-09-19 MED ORDER — LACTATED RINGERS IV BOLUS (SEPSIS)
500.0000 mL | Freq: Once | INTRAVENOUS | Status: AC
  Administered 2015-09-19: 500 mL via INTRAVENOUS

## 2015-09-19 MED ORDER — FERROUS SULFATE 325 (65 FE) MG PO TABS
325.0000 mg | ORAL_TABLET | Freq: Two times a day (BID) | ORAL | Status: DC
  Administered 2015-09-19 – 2015-09-21 (×4): 325 mg via ORAL
  Filled 2015-09-19 (×4): qty 1

## 2015-09-19 MED ORDER — MISOPROSTOL 200 MCG PO TABS: 800.0000 ug | ORAL_TABLET | Freq: Once | ORAL | Status: AC

## 2015-09-19 MED ORDER — PHENYLEPHRINE 40 MCG/ML (10ML) SYRINGE FOR IV PUSH (FOR BLOOD PRESSURE SUPPORT)
80.0000 ug | PREFILLED_SYRINGE | INTRAVENOUS | Status: DC | PRN
  Filled 2015-09-19: qty 10

## 2015-09-19 MED ORDER — LIDOCAINE HCL (PF) 1 % IJ SOLN
INTRAMUSCULAR | Status: DC | PRN
  Administered 2015-09-19: 2 mL via EPIDURAL
  Administered 2015-09-19: 5 mL via EPIDURAL
  Administered 2015-09-19: 3 mL via EPIDURAL

## 2015-09-19 MED ORDER — DIBUCAINE 1 % RE OINT
1.0000 | TOPICAL_OINTMENT | RECTAL | Status: DC | PRN
Start: 2015-09-19 — End: 2015-09-21

## 2015-09-19 MED ORDER — DIPHENHYDRAMINE HCL 50 MG/ML IJ SOLN: 12.5000 mg | INTRAMUSCULAR | Status: DC | PRN

## 2015-09-19 MED ORDER — OXYTOCIN 10 UNIT/ML IJ SOLN
10.0000 [IU]/h | INTRAVENOUS | Status: DC
  Administered 2015-09-19: 2.5 [IU]/h via INTRAVENOUS

## 2015-09-19 MED ORDER — ZOLPIDEM TARTRATE 5 MG PO TABS
5.0000 mg | ORAL_TABLET | Freq: Every evening | ORAL | Status: DC | PRN
Start: 2015-09-19 — End: 2015-09-21

## 2015-09-19 MED ORDER — PRENATAL MULTIVITAMIN CH
1.0000 | ORAL_TABLET | Freq: Every day | ORAL | Status: DC
  Administered 2015-09-19 – 2015-09-20 (×2): 1 via ORAL
  Filled 2015-09-19 (×2): qty 1

## 2015-09-19 MED ORDER — TETANUS-DIPHTH-ACELL PERTUSSIS 5-2.5-18.5 LF-MCG/0.5 IM SUSP: 0.5000 mL | Freq: Once | INTRAMUSCULAR | Status: DC

## 2015-09-19 MED ORDER — FENTANYL 2.5 MCG/ML BUPIVACAINE 1/10 % EPIDURAL INFUSION (WH - ANES)
14.0000 mL/h | INTRAMUSCULAR | Status: DC | PRN
  Administered 2015-09-19 (×2): 14 mL/h via EPIDURAL
  Filled 2015-09-19: qty 125

## 2015-09-19 MED ORDER — COCONUT OIL OIL: 1.0000 "application " | TOPICAL_OIL | Status: DC | PRN

## 2015-09-19 MED ORDER — PHENYLEPHRINE 40 MCG/ML (10ML) SYRINGE FOR IV PUSH (FOR BLOOD PRESSURE SUPPORT): 80.0000 ug | PREFILLED_SYRINGE | INTRAVENOUS | Status: DC | PRN

## 2015-09-19 MED ORDER — DIPHENHYDRAMINE HCL 25 MG PO CAPS: 25.0000 mg | ORAL_CAPSULE | Freq: Four times a day (QID) | ORAL | Status: DC | PRN

## 2015-09-19 MED ORDER — LACTATED RINGERS IV SOLN
INTRAVENOUS | Status: DC
  Administered 2015-09-19: 14:00:00 via INTRAVENOUS

## 2015-09-19 MED ORDER — MISOPROSTOL 200 MCG PO TABS
ORAL_TABLET | ORAL | Status: AC
  Filled 2015-09-19: qty 1

## 2015-09-19 MED ORDER — MISOPROSTOL 200 MCG PO TABS
800.0000 ug | ORAL_TABLET | Freq: Once | ORAL | Status: AC
  Administered 2015-09-19: 800 ug via RECTAL

## 2015-09-19 MED ORDER — ONDANSETRON HCL 4 MG PO TABS
4.0000 mg | ORAL_TABLET | ORAL | Status: DC | PRN
Start: 2015-09-19 — End: 2015-09-21

## 2015-09-19 NOTE — H&P (Signed)
This is Dr. Francoise CeoBernard [name] dictating the history and physical on  Debra Lucas she is a 20 year old primigravida EDC 09/19/1737 weeks and 6 days positive GBS received penicillin she was admitted because her blood pressure was 150/100 and her labs were normal she was started on magnesium sulfate and started on Pitocin she is now 9 cm and 0 station amniotomy performed at 3 AM the fluid was clear she received 1 dose of labetalol IV throat night Past medical history negative Past surgical history negative Social history negative System review negative Physical exam well-developed female in labor HEENT negative Lungs clear to P&A Heart regular rhythm no murmurs no gallops Breasts negative Abdomen term Pelvic as described above Extremities negative

## 2015-09-19 NOTE — Progress Notes (Signed)
RN recheck, patient having SVE during previous BP. PRN Labetolol not needed at this time per parameters, will continue to monitor.

## 2015-09-19 NOTE — Anesthesia Postprocedure Evaluation (Signed)
Anesthesia Post Note  Patient: Debra Lucas  Procedure(s) Performed: * No procedures listed *  Patient location during evaluation: Women's Unit Anesthesia Type: Epidural Level of consciousness: awake and alert and oriented Pain management: pain level controlled Vital Signs Assessment: post-procedure vital signs reviewed and stable Respiratory status: spontaneous breathing Cardiovascular status: stable Postop Assessment: patient able to bend at knees, no signs of nausea or vomiting and adequate PO intake Anesthetic complications: no     Last Vitals:  Filed Vitals:   09/19/15 1215 09/19/15 1454  BP: 146/95   Pulse: 98 93  Temp: 37.3 C   Resp: 18     Last Pain:  Filed Vitals:   09/19/15 1455  PainSc: 0-No pain   Pain Goal: Patients Stated Pain Goal:  (asleep) (09/19/15 0240)               Donnalee CurryMalinova,Ingra Rother Hristova

## 2015-09-19 NOTE — Anesthesia Preprocedure Evaluation (Signed)
Anesthesia Evaluation  Patient identified by MRN, date of birth, ID band Patient awake    Reviewed: Allergy & Precautions, NPO status , Patient's Chart, lab work & pertinent test results  Airway Mallampati: II  TM Distance: >3 FB Neck ROM: Full    Dental  (+) Teeth Intact, Dental Advisory Given   Pulmonary neg pulmonary ROS,    Pulmonary exam normal breath sounds clear to auscultation       Cardiovascular hypertension (PIH), Pt. on medications and Pt. on home beta blockers Normal cardiovascular exam Rhythm:Regular Rate:Normal     Neuro/Psych negative neurological ROS  negative psych ROS   GI/Hepatic negative GI ROS, Neg liver ROS,   Endo/Other  negative endocrine ROS  Renal/GU negative Renal ROS     Musculoskeletal negative musculoskeletal ROS (+)   Abdominal   Peds  Hematology Plt 127k    Anesthesia Other Findings Day of surgery medications reviewed with the patient.  Reproductive/Obstetrics (+) Pregnancy PIH                             Anesthesia Physical Anesthesia Plan  ASA: III  Anesthesia Plan: Epidural   Post-op Pain Management:    Induction:   Airway Management Planned:   Additional Equipment:   Intra-op Plan:   Post-operative Plan:   Informed Consent: I have reviewed the patients History and Physical, chart, labs and discussed the procedure including the risks, benefits and alternatives for the proposed anesthesia with the patient or authorized representative who has indicated his/her understanding and acceptance.   Dental advisory given  Plan Discussed with:   Anesthesia Plan Comments: (Patient identified. Risks/Benefits/Options discussed with patient including but not limited to bleeding, infection, nerve damage, paralysis, failed block, incomplete pain control, headache, blood pressure changes, nausea, vomiting, reactions to medication both or allergic, itching  and postpartum back pain. Confirmed with bedside nurse the patient's most recent platelet count. Confirmed with patient that they are not currently taking any anticoagulation, have any bleeding history or any family history of bleeding disorders. Patient expressed understanding and wished to proceed. All questions were answered. )        Anesthesia Quick Evaluation

## 2015-09-19 NOTE — Anesthesia Procedure Notes (Signed)
Epidural Patient location during procedure: OB  Staffing Anesthesiologist: Kaylum Shrum EDWARD Performed by: anesthesiologist   Preanesthetic Checklist Completed: patient identified, pre-op evaluation, timeout performed, IV checked, risks and benefits discussed and monitors and equipment checked  Epidural Patient position: sitting Prep: DuraPrep Patient monitoring: blood pressure and continuous pulse ox Approach: midline Location: L3-L4 Injection technique: LOR air  Needle:  Needle type: Tuohy  Needle gauge: 17 G Needle length: 9 cm Needle insertion depth: 5 cm Catheter size: 19 Gauge Catheter at skin depth: 10 cm Test dose: negative and Other (1% Lidocaine)  Additional Notes Patient identified.  Risk benefits discussed including failed block, incomplete pain control, headache, nerve damage, paralysis, blood pressure changes, nausea, vomiting, reactions to medication both toxic or allergic, and postpartum back pain.  Patient expressed understanding and wished to proceed.  All questions were answered.  Sterile technique used throughout procedure and epidural site dressed with sterile barrier dressing. No paresthesia or other complications noted. The patient did not experience any signs of intravascular injection such as tinnitus or metallic taste in mouth nor signs of intrathecal spread such as rapid motor block. Please see nursing notes for vital signs. Reason for block:procedure for pain   

## 2015-09-20 LAB — CBC
HEMATOCRIT: 27.3 % — AB (ref 36.0–46.0)
HEMOGLOBIN: 8.9 g/dL — AB (ref 12.0–15.0)
MCH: 23.6 pg — ABNORMAL LOW (ref 26.0–34.0)
MCHC: 32.6 g/dL (ref 30.0–36.0)
MCV: 72.4 fL — ABNORMAL LOW (ref 78.0–100.0)
Platelets: 114 10*3/uL — ABNORMAL LOW (ref 150–400)
RBC: 3.77 MIL/uL — AB (ref 3.87–5.11)
RDW: 15 % (ref 11.5–15.5)
WBC: 11.1 10*3/uL — AB (ref 4.0–10.5)

## 2015-09-20 MED ORDER — LABETALOL HCL 200 MG PO TABS
200.0000 mg | ORAL_TABLET | Freq: Four times a day (QID) | ORAL | Status: DC
  Administered 2015-09-20 – 2015-09-21 (×2): 200 mg via ORAL
  Filled 2015-09-20 (×2): qty 1

## 2015-09-20 NOTE — Progress Notes (Signed)
Pt was educated on documenting I/O on baby and still did not document I/O on baby.

## 2015-09-20 NOTE — Progress Notes (Signed)
Patient ID: Debra Lucas, female   DOB: November 04, 1995, 20 y.o.   MRN: [id number] Postpartum day one Blood pressure 123/81 pulse 76 respiration 16 Patient got Lasix 20 mg at 3:00 this morning and her outputs greater than 30 cc per hour Her magnesium sulfate was discontinued this morning because her blood pressures are all normal doing well

## 2015-09-21 NOTE — Discharge Summary (Signed)
Obstetric Discharge Summary Reason for Admission: induction of labor Prenatal Procedures: none Intrapartum Procedures: spontaneous vaginal delivery Postpartum Procedures: none Complications-Operative and Postpartum: none HEMOGLOBIN  Date Value Ref Range Status  09/20/2015 8.9* 12.0 - 15.0 g/dL Final    Comment:    DELTA CHECK NOTED REPEATED TO VERIFY    HCT  Date Value Ref Range Status  09/20/2015 27.3* 36.0 - 46.0 % Final    Physical Exam:  General: alert Lochia: appropriate Uterine Fundus: firm Incision: healing well DVT Evaluation: No evidence of DVT seen on physical exam.  Discharge Diagnoses: Term Pregnancy-delivered  Discharge Information: Date: 09/21/2015 Activity: pelvic rest Diet: routine Medications: Percocet Condition: improved Instructions: refer to practice specific booklet Discharge to: home Follow-up Information    Follow up with Kathreen CosierMARSHALL,BERNARD A, MD.   Specialty:  Obstetrics and Gynecology   Contact information:   [address] [place] [address] [phone]       Newborn Data: Live born female  Birth Weight: 6 lb 7.5 oz (2935 g) APGAR: 8, 9  Home with mother.  MARSHALL,BERNARD A 09/21/2015, 6:35 AM

## 2015-09-21 NOTE — Discharge Instructions (Signed)
Discharge instructions   You can wash your hair  Shower  Eat what you want  Drink what you want  See me in 6 weeks  Your ankles are going to swell more in the next 2 weeks than when pregnant  No sex for 6 weeks   Debra Lucas A, MD 09/21/2015

## 2015-09-21 NOTE — Progress Notes (Signed)
Pt is discharged in the care of Mother. Downstairs per ambulatory  With infant in arms. Denies any pain or discomfort. Spirts are good. Understands all discharged instructions for baby and her. Questions asked and answered. Stable.No equipment needed for home use..Marland Kitchen

## 2015-09-21 NOTE — Progress Notes (Signed)
Patient ID: Luiz OchoaRochelle L Brueckner, female   DOB: 03-11-1996, 20 y.o.   MRN: [id number] Postpartum day 2 Lois blood pressure 114/57 highest 153/95 Pulse 72 respiration 17 Fundus firm Lochia moderate Legs negative home today on labetalol 200 by mouth every 6 hours to see me in one week for blood pressure check Legs negative

## 2015-12-05 ENCOUNTER — Ambulatory Visit: Payer: Medicaid Other | Admitting: Obstetrics & Gynecology

## 2016-02-05 ENCOUNTER — Encounter (HOSPITAL_COMMUNITY): Payer: Self-pay | Admitting: Emergency Medicine

## 2016-02-05 ENCOUNTER — Emergency Department (HOSPITAL_COMMUNITY)
Admission: EM | Admit: 2016-02-05 | Discharge: 2016-02-05 | Disposition: A | Discharge status: Home / Self Care | Payer: Medicaid Other | Attending: Dermatology | Admitting: Dermatology

## 2016-02-05 DIAGNOSIS — Z5321 Procedure and treatment not carried out due to patient leaving prior to being seen by health care provider: Secondary | ICD-10-CM | POA: Insufficient documentation

## 2016-02-05 DIAGNOSIS — Z7722 Contact with and (suspected) exposure to environmental tobacco smoke (acute) (chronic): Secondary | ICD-10-CM | POA: Diagnosis not present

## 2016-02-05 DIAGNOSIS — R51 Headache: Secondary | ICD-10-CM | POA: Insufficient documentation

## 2016-02-05 NOTE — ED Triage Notes (Signed)
Pt states she was having some headaches yesterday and this morning her eye is pink. When she woke up it had dried drainage around the eye and was "watering." Pt also states her eye itches.

## 2016-03-27 ENCOUNTER — Encounter (HOSPITAL_COMMUNITY): Payer: Self-pay | Admitting: Emergency Medicine

## 2016-03-27 ENCOUNTER — Emergency Department (HOSPITAL_COMMUNITY)
Admission: EM | Admit: 2016-03-27 | Discharge: 2016-03-27 | Disposition: A | Discharge status: Home / Self Care | Payer: Medicaid Other | Attending: Emergency Medicine | Admitting: Emergency Medicine

## 2016-03-27 DIAGNOSIS — Z7722 Contact with and (suspected) exposure to environmental tobacco smoke (acute) (chronic): Secondary | ICD-10-CM | POA: Insufficient documentation

## 2016-03-27 DIAGNOSIS — R300 Dysuria: Secondary | ICD-10-CM | POA: Diagnosis present

## 2016-03-27 DIAGNOSIS — N3090 Cystitis, unspecified without hematuria: Secondary | ICD-10-CM | POA: Insufficient documentation

## 2016-03-27 LAB — URINALYSIS, ROUTINE W REFLEX MICROSCOPIC
BILIRUBIN URINE: NEGATIVE
GLUCOSE, UA: NEGATIVE mg/dL
KETONES UR: NEGATIVE mg/dL
Nitrite: NEGATIVE
PH: 7 (ref 5.0–8.0)
Protein, ur: 30 mg/dL — AB
SPECIFIC GRAVITY, URINE: 1.031 — AB (ref 1.005–1.030)

## 2016-03-27 LAB — URINE MICROSCOPIC-ADD ON

## 2016-03-27 LAB — POC URINE PREG, ED: Preg Test, Ur: NEGATIVE

## 2016-03-27 MED ORDER — PHENAZOPYRIDINE HCL 200 MG PO TABS: 200.0000 mg | ORAL_TABLET | Freq: Three times a day (TID) | ORAL | 0 refills | Status: DC

## 2016-03-27 MED ORDER — CEPHALEXIN 500 MG PO CAPS: 500.0000 mg | ORAL_CAPSULE | Freq: Four times a day (QID) | ORAL | 0 refills | Status: DC

## 2016-03-27 MED ORDER — CEPHALEXIN 250 MG PO CAPS
500.0000 mg | ORAL_CAPSULE | Freq: Once | ORAL | Status: AC
  Administered 2016-03-27: 500 mg via ORAL
  Filled 2016-03-27: qty 2

## 2016-03-27 MED ORDER — PHENAZOPYRIDINE HCL 100 MG PO TABS
200.0000 mg | ORAL_TABLET | Freq: Once | ORAL | Status: AC
  Administered 2016-03-27: 200 mg via ORAL
  Filled 2016-03-27: qty 2

## 2016-03-27 NOTE — ED Provider Notes (Signed)
[facility]-EMERGENCY DEPT Provider Note   CSN: [id number] Arrival date & time: 03/27/16  0008     History   Chief Complaint Chief Complaint  Patient presents with  . Dysuria  . Hematuria    HPI Debra Lucas is a 20 y.o. female who presents to the ED with pain with urination, frequency and urgency.  The history is provided by the patient. No language interpreter was used.  Dysuria   This is a new problem. The current episode started 2 days ago. The problem has been gradually worsening. The pain is moderate. There has been no fever. She is sexually active. There is no history of pyelonephritis. Associated symptoms include frequency, hematuria and urgency. Pertinent negatives include no nausea, no vomiting, no possible pregnancy and no flank pain. She has tried nothing for the symptoms.  Hematuria     Past Medical History:  Diagnosis Date  . Medical history non-contributory     Patient Active Problem List   Diagnosis Date Noted  . NVD (normal vaginal delivery) 09/19/2015  . Pre-eclampsia in third trimester 09/18/2015    Past Surgical History:  Procedure Laterality Date  . ADENOIDECTOMY      OB History    Gravida Para Term Preterm AB Living   1 1 1     1    SAB TAB Ectopic Multiple Live Births         0 1       Home Medications    Prior to Admission medications   Medication Sig Start Date End Date Taking? Authorizing Provider  cephALEXin (KEFLEX) 500 MG capsule Take 1 capsule (500 mg total) by mouth 4 (four) times daily. 03/27/16   Opal Mckellips Orlene OchM Emeric Novinger, NP  phenazopyridine (PYRIDIUM) 200 MG tablet Take 1 tablet (200 mg total) by mouth 3 (three) times daily. 03/27/16   Nelia Rogoff Orlene OchM Giankarlo Leamer, NP    Family History No family history on file.  Social History Social History  Substance Use Topics  . Smoking status: Passive Smoke Exposure - Never Smoker  . Smokeless tobacco: Never Used  . Alcohol use No     Allergies   Shellfish allergy   Review of Systems Review of  Systems  Gastrointestinal: Negative for nausea and vomiting.  Genitourinary: Positive for dysuria, frequency, hematuria and urgency. Negative for flank pain.     Physical Exam Updated Vital Signs BP 122/95 (BP Location: Left Arm)   Pulse 92   Temp 98 F (36.7 C) (Oral)   Resp 18   LMP 02/27/2016 (Approximate)   SpO2 99%   Physical Exam  Constitutional: She is oriented to person, place, and time. She appears well-developed and well-nourished. No distress.  HENT:  Head: Normocephalic.  Eyes: EOM are normal.  Neck: Neck supple.  Pulmonary/Chest: Effort normal.  Abdominal: Soft. There is tenderness in the suprapubic area. There is no CVA tenderness.  Genitourinary:  Genitourinary Comments: Patient declined, she reports no vaginal d/c or concern for STI's.   Musculoskeletal: Normal range of motion.  Neurological: She is alert and oriented to person, place, and time. No cranial nerve deficit.  Skin: Skin is warm and dry.  Psychiatric: She has a normal mood and affect. Her behavior is normal.  Nursing note and vitals reviewed.    ED Treatments / Results  Labs (all labs ordered are listed, but only abnormal results are displayed) Labs Reviewed  URINALYSIS, ROUTINE W REFLEX MICROSCOPIC (NOT [facility]) - Abnormal; Notable for the following:       Result  Value   APPearance CLOUDY (*)    Specific Gravity, Urine 1.031 (*)    Hgb urine dipstick SMALL (*)    Protein, ur 30 (*)    Leukocytes, UA MODERATE (*)    All other components within normal limits  URINE MICROSCOPIC-ADD ON - Abnormal; Notable for the following:    Squamous Epithelial / LPF 6-30 (*)    Bacteria, UA MANY (*)    All other components within normal limits  URINE CULTURE  POC URINE PREG, ED     Radiology No results found.  Procedures Procedures (including critical care time)  Medications Ordered in ED Medications  cephALEXin (KEFLEX) capsule 500 mg (500 mg Oral Given 03/27/16 0106)  phenazopyridine  (PYRIDIUM) tablet 200 mg (200 mg Oral Given 03/27/16 0106)     Initial Impression / Assessment and Plan / ED Course  I have reviewed the triage vital signs and the nursing notes.  Pertinent labs & imaging results that were available during my care of the patient were reviewed by me and considered in my medical decision making (see chart for details).  Clinical Course    20 y.o. female with UTI symptoms stable for d/c without fever, CVA tenderness or other signs of pyelo. No n/v. Will treat with antibiotics and Rx for Pyridium. Urine sent for culture.   Final Clinical Impressions(s) / ED Diagnoses   Final diagnoses:  Cystitis    New Prescriptions New Prescriptions   CEPHALEXIN (KEFLEX) 500 MG CAPSULE    Take 1 capsule (500 mg total) by mouth 4 (four) times daily.   PHENAZOPYRIDINE (PYRIDIUM) 200 MG TABLET    Take 1 tablet (200 mg total) by mouth 3 (three) times daily.     [address]Pernella Ackerley AikenM Talton Delpriore, NP 03/27/16 0109    Glynn OctaveStephen Rancour, MD 03/27/16 [phone]

## 2016-03-27 NOTE — ED Triage Notes (Signed)
Pt. Reports intermittent dysuria with hematuria onset Saturday , denies pain , no fever or chills .

## 2016-03-27 NOTE — ED Notes (Signed)
[facility] lab draw

## 2016-03-27 NOTE — Discharge Instructions (Signed)
The medicine for bladder spasm will turn your urine orange.  We have sent you urine for culture. If we need to change your medication someone will call you .

## 2016-03-29 LAB — URINE CULTURE

## 2016-03-30 ENCOUNTER — Telehealth (HOSPITAL_BASED_OUTPATIENT_CLINIC_OR_DEPARTMENT_OTHER): Payer: Self-pay

## 2016-03-30 NOTE — Progress Notes (Signed)
ED Antimicrobial Stewardship Positive Culture Follow Up   Debra Lucas is an 20 y.o. female who [facility] with a chief complaint of  Chief Complaint  Patient presents with  . Dysuria  . Hematuria    Recent Results (from the past 720 hour(s))  Urine culture     Status: Abnormal   Collection Time: 03/27/16 12:59 AM  Result Value Ref Range Status   Specimen Description URINE, CLEAN CATCH  Final   Special Requests NONE  Final   Culture (A)  Final    >=100,000 COLONIES/mL STAPHYLOCOCCUS SPECIES (COAGULASE NEGATIVE)   Report Status 03/29/2016 FINAL  Final   Organism ID, Bacteria STAPHYLOCOCCUS SPECIES (COAGULASE NEGATIVE) (A)  Final      Susceptibility   Staphylococcus species (coagulase negative) - MIC*    CIPROFLOXACIN <=0.5 SENSITIVE Sensitive     GENTAMICIN <=0.5 SENSITIVE Sensitive     NITROFURANTOIN <=16 SENSITIVE Sensitive     OXACILLIN 2 RESISTANT Resistant     TETRACYCLINE <=1 SENSITIVE Sensitive     VANCOMYCIN <=0.5 SENSITIVE Sensitive     TRIMETH/SULFA <=10 SENSITIVE Sensitive     CLINDAMYCIN <=0.25 SENSITIVE Sensitive     RIFAMPIN <=0.5 SENSITIVE Sensitive     Inducible Clindamycin NEGATIVE Sensitive     * >=100,000 COLONIES/mL STAPHYLOCOCCUS SPECIES (COAGULASE NEGATIVE)    [x]  Treated with cephalexin, organism resistant to prescribed antimicrobial []  Patient discharged originally without antimicrobial agent and treatment is now indicated  New antibiotic prescription: Bactrim DS 1 tab bid x 3 days  ED Provider: Arthor CaptainAbigail Harris, PA-C  Bertram MillardMichael A Mallie Linnemann 03/30/2016, 8:42 AM Infectious Diseases Pharmacist Phone# [phone]

## 2016-03-30 NOTE — Telephone Encounter (Signed)
Post ED Visit - Positive Culture Follow-up: Unsuccessful Patient Follow-up  Culture assessed and recommendations reviewed by: []  Enzo BiNathan Batchelder, Pharm.D. []  Celedonio MiyamotoJeremy Frens, Pharm.D., BCPS [x]  Garvin FilaMike Maccia, Pharm.D. []  Georgina PillionElizabeth Martin, Pharm.D., BCPS []  PinehurstMinh Pham, [address]Pharm[address]D., BCPS, AAHIVP []  Estella HuskMichelle Turner, Pharm.D., BCPS, AAHIVP []  Tennis Mustassie Stewart, Pharm.D. []  Sherle Poeob Vincent, [address]Pharm[address]D.  Positive urine culture  []  Patient discharged without antimicrobial prescription and treatment is now indicated [x]  Organism is resistant to prescribed ED discharge antimicrobial []  Patient with positive blood cultures   Unable to contact patient after 3 attempts, letter will be sent to address on file  Jerry CarasCullom, Lunabelle Oatley Burnett 03/30/2016, 9:30 AM

## 2016-04-17 ENCOUNTER — Telehealth (HOSPITAL_BASED_OUTPATIENT_CLINIC_OR_DEPARTMENT_OTHER): Payer: Self-pay | Admitting: Emergency Medicine

## 2016-05-20 NOTE — L&D Delivery Note (Signed)
  Delivery Note Labor pattern finally improved and pt developed an urge to push.  After about a 10-15 minute 2nd stage, at 10:09 PM a viable female was delivered via Vaginal, Spontaneous Delivery (Presentation: LOP  ).  APGAR:7/9 ; weight pending . After 1 minute, the cord was clamped and cut. 40 units of pitocin diluted in 1000cc LR was infused rapidly IV.  The placenta separated spontaneously and delivered via CCT and maternal pushing effort.  It was inspected and appears to be intact with a 3 VC   Anesthesia:  epidural Episiotomy:  none Lacerations:  1st degree right labial, no stitches Suture Repair:  Est. Blood Loss (mL): 100  Mom to postpartum.  Baby to Couplet care / Skin to Skin   The above was performed by Debra Lucas, Med student under my direct supervision and guidance.  Marland Kitchen.  Lucas,Debra Strickling 01/02/2017, 10:14 PM  .

## 2016-05-23 ENCOUNTER — Encounter (HOSPITAL_COMMUNITY): Payer: Self-pay | Admitting: *Deleted

## 2016-05-23 ENCOUNTER — Inpatient Hospital Stay (HOSPITAL_COMMUNITY): Payer: Medicaid Other

## 2016-05-23 ENCOUNTER — Inpatient Hospital Stay (HOSPITAL_COMMUNITY)
Admission: AD | Admit: 2016-05-23 | Discharge: 2016-05-23 | Disposition: A | Discharge status: Home / Self Care | Payer: Medicaid Other | Source: Ambulatory Visit | Attending: Obstetrics & Gynecology | Admitting: Obstetrics & Gynecology

## 2016-05-23 DIAGNOSIS — O209 Hemorrhage in early pregnancy, unspecified: Secondary | ICD-10-CM

## 2016-05-23 DIAGNOSIS — Z3A01 Less than 8 weeks gestation of pregnancy: Secondary | ICD-10-CM | POA: Insufficient documentation

## 2016-05-23 DIAGNOSIS — O208 Other hemorrhage in early pregnancy: Secondary | ICD-10-CM | POA: Insufficient documentation

## 2016-05-23 DIAGNOSIS — Z91013 Allergy to seafood: Secondary | ICD-10-CM | POA: Diagnosis not present

## 2016-05-23 DIAGNOSIS — R109 Unspecified abdominal pain: Secondary | ICD-10-CM | POA: Diagnosis present

## 2016-05-23 DIAGNOSIS — B9689 Other specified bacterial agents as the cause of diseases classified elsewhere: Secondary | ICD-10-CM | POA: Insufficient documentation

## 2016-05-23 DIAGNOSIS — O468X1 Other antepartum hemorrhage, first trimester: Secondary | ICD-10-CM

## 2016-05-23 DIAGNOSIS — O23591 Infection of other part of genital tract in pregnancy, first trimester: Secondary | ICD-10-CM | POA: Diagnosis not present

## 2016-05-23 DIAGNOSIS — O418X10 Other specified disorders of amniotic fluid and membranes, first trimester, not applicable or unspecified: Secondary | ICD-10-CM | POA: Diagnosis not present

## 2016-05-23 DIAGNOSIS — N760 Acute vaginitis: Secondary | ICD-10-CM | POA: Diagnosis not present

## 2016-05-23 LAB — CBC WITH DIFFERENTIAL/PLATELET
BASOS ABS: 0 10*3/uL (ref 0.0–0.1)
BASOS PCT: 0 %
EOS ABS: 0.2 10*3/uL (ref 0.0–0.7)
Eosinophils Relative: 3 %
HEMATOCRIT: 40.6 % (ref 36.0–46.0)
Hemoglobin: 13.4 g/dL (ref 12.0–15.0)
Lymphocytes Relative: 27 %
Lymphs Abs: 1.7 10*3/uL (ref 0.7–4.0)
MCH: 22.9 pg — ABNORMAL LOW (ref 26.0–34.0)
MCHC: 33 g/dL (ref 30.0–36.0)
MCV: 69.4 fL — ABNORMAL LOW (ref 78.0–100.0)
MONO ABS: 0.2 10*3/uL (ref 0.1–1.0)
MONOS PCT: 3 %
NEUTROS ABS: 4.2 10*3/uL (ref 1.7–7.7)
NEUTROS PCT: 67 %
Platelets: 162 10*3/uL (ref 150–400)
RBC: 5.85 MIL/uL — ABNORMAL HIGH (ref 3.87–5.11)
RDW: 16.3 % — AB (ref 11.5–15.5)
WBC: 6.3 10*3/uL (ref 4.0–10.5)

## 2016-05-23 LAB — URINALYSIS, ROUTINE W REFLEX MICROSCOPIC
Bilirubin Urine: NEGATIVE
Glucose, UA: NEGATIVE mg/dL
Hgb urine dipstick: NEGATIVE
KETONES UR: 20 mg/dL — AB
Nitrite: NEGATIVE
PROTEIN: 30 mg/dL — AB
Specific Gravity, Urine: 1.024 (ref 1.005–1.030)
pH: 7 (ref 5.0–8.0)

## 2016-05-23 LAB — WET PREP, GENITAL
SPERM: NONE SEEN
TRICH WET PREP: NONE SEEN
YEAST WET PREP: NONE SEEN

## 2016-05-23 LAB — OB RESULTS CONSOLE HIV ANTIBODY (ROUTINE TESTING): HIV: NONREACTIVE

## 2016-05-23 LAB — POCT PREGNANCY, URINE: PREG TEST UR: POSITIVE — AB

## 2016-05-23 LAB — HCG, QUANTITATIVE, PREGNANCY: HCG, BETA CHAIN, QUANT, S: 197038 m[IU]/mL — AB (ref <5)

## 2016-05-23 MED ORDER — METRONIDAZOLE 500 MG PO TABS: 500.0000 mg | ORAL_TABLET | Freq: Two times a day (BID) | ORAL | 0 refills | Status: DC

## 2016-05-23 NOTE — MAU Note (Signed)
Pt presents to MAU with complaints of lower abdominal cramping and vaginal bleeding  on and off for 2 weeks. PT reports positive pregnancy test 3 weeks ago.

## 2016-05-23 NOTE — Discharge Instructions (Signed)
Bacterial Vaginosis Bacterial vaginosis is an infection of the vagina. It happens when too many germs (bacteria) grow in the vagina. This infection puts you at risk for infections from sex (STIs). Treating this infection can lower your risk for some STIs. You should also treat this if you are pregnant. It can cause your baby to be born early. Follow these instructions at home: Medicines  Take over-the-counter and prescription medicines only as told by your doctor.  Take or use your antibiotic medicine as told by your doctor. Do not stop taking or using it even if you start to feel better. General instructions  If you your sexual partner is a woman, tell her that you have this infection. She needs to get treatment if she has symptoms. If you have a female partner, he does not need to be treated.  During treatment:  Avoid sex.  Do not douche.  Avoid alcohol as told.  Avoid breastfeeding as told.  Drink enough fluid to keep your pee (urine) clear or pale yellow.  Keep your vagina and butt (rectum) clean.  Wash the area with warm water every day.  Wipe from front to back after you use the toilet.  Keep all follow-up visits as told by your doctor. This is important. Preventing this condition  Do not douche.  Use only warm water to wash around your vagina.  Use protection when you have sex. This includes:  Latex condoms.  Dental dams.  Limit how many people you have sex with. It is best to only have sex with the same person (be monogamous).  Get tested for STIs. Have your partner get tested.  Wear underwear that is cotton or lined with cotton.  Avoid tight pants and pantyhose. This is most important in summer.  Do not use any products that have nicotine or tobacco in them. These include cigarettes and e-cigarettes. If you need help quitting, ask your doctor.  Do not use illegal drugs.  Limit how much alcohol you drink. Contact a doctor if:  Your symptoms do not get  better, even after you are treated.  You have more discharge or pain when you pee (urinate).  You have a fever.  You have pain in your belly (abdomen).  You have pain with sex.  Your bleed from your vagina between periods. Summary  This infection happens when too many germs (bacteria) grow in the vagina.  Treating this condition can lower your risk for some infections from sex (STIs).  You should also treat this if you are pregnant. It can cause early (premature) birth.  Do not stop taking or using your antibiotic medicine even if you start to feel better. This information is not intended to replace advice given to you by your health care provider. Make sure you discuss any questions you have with your health care provider. Document Released: 02/13/2008 Document Revised: 01/20/2016 Document Reviewed: 01/20/2016 Elsevier Interactive Patient Education  2017 [employer].  Dental Work and Pregnancy Proper dental care before, during, and after pregnancy is important for you and your baby. Pregnancy hormones can sometimes cause the gums to swell, which makes it easier for food to become trapped between teeth. The health of your teeth and gums can affect your growing baby.  DENTAL CARE RECOMMENDATIONS To help prevent infection and maintain healthy teeth and gums, a thorough oral examination is recommended for all women in the first trimester of pregnancy. Routine cleanings and examinations are recommended throughout pregnancy.  DENTAL CARE CONSIDERATIONS  Tell  your dentist if you are pregnant or want to become pregnant.   If you are pregnant, routine X-ray exams should be avoided until after your baby is born. However, they do not need to be avoided if you are trying to become pregnant.   If you need an emergency procedure that includes a dental X-ray exam during pregnancy, very low levels of radiation will be emitted from the X-ray machines. Lead aprons can be used for protection of  the chest, abdomen, and thyroid.  Your dentist will help you weigh the risks and benefits of dental procedures during pregnancy. If possible, it is best to have dental procedures (such as cavity fillings and crown repair) during the second trimester of pregnancy or after the baby is born.   If you and your dentist decide to postpone a procedure for any reason, your dentist can suggest treatment to reduce the chance of infection until the procedure is performed.  HOME CARE INSTRUCTIONS   Follow good oral hygiene habits at home.  Brush your teeth twice daily with fluoride toothpaste. Brush thoroughly for at least 2 minutes. Avoid strong flavored toothpastes if you have morning sickness.   Floss at least once daily.   Eat a well-balanced diet low in sugar and carbohydrates.   See your dentist for normal interval oral examinations and cleanings.   If you vomit, rinse your mouth with water afterward.   Make a dental appointment if you experience oral problems.   Follow up with your dentist as directed.  SEEK DENTAL CARE IF:  Oral symptoms such as pain, bleeding, swelling, or inflammation develop or worsen.   You develop growths or swelling in between teeth.   You develop signs of infection, such as:   A fever of more than 100.5 F (38.1 C).   Chills. This information is not intended to replace advice given to you by your health care provider. Make sure you discuss any questions you have with your health care provider. Document Released: 10/24/2009 Document Revised: 08/28/2015 Document Reviewed: 10/07/2012 Elsevier Interactive Patient Education  2017 [employer]

## 2016-05-23 NOTE — MAU Provider Note (Signed)
History     CSN: [id number]  Arrival date and time: 05/23/16 1616   First Provider Initiated Contact with Patient 05/23/16 1746      Chief Complaint  Patient presents with  . Abdominal Pain  . Possible Pregnancy   HPI [name] is a 21 y.o. G2P1001 at [redacted]w[redacted]d who presents to [facility]U today with complaint of vaginal bleeding and +HPT. The patient stats +HPT 3 weeks ago. She has had light spotting off and on x 2 weeks and mild intermittent associated cramping as well during this time. She has not taken anything for pain. She denies N/V/D or constipation, UTI symptoms or fever. She has noted a small amount of white discharge with foul odor as well.    OB History    Gravida Para Term Preterm AB Living   2 1 1     1    SAB TAB Ectopic Multiple Live Births         0 1      Past Medical History:  Diagnosis Date  . Medical history non-contributory     Past Surgical History:  Procedure Laterality Date  . ADENOIDECTOMY    . TONSILLECTOMY      No family history on file.  Social History  Substance Use Topics  . Smoking status: Passive Smoke Exposure - Never Smoker  . Smokeless tobacco: Never Used  . Alcohol use No    Allergies:  Allergies  Allergen Reactions  . Shellfish Allergy Swelling and Rash    No prescriptions prior to admission.    Review of Systems  Constitutional: Negative for fever.  Gastrointestinal: Positive for abdominal pain. Negative for constipation, diarrhea, nausea and vomiting.  Genitourinary: Positive for vaginal bleeding. Negative for dysuria, frequency and urgency.   Physical Exam   Blood pressure 113/65, pulse 91, temperature 98 F (36.7 C), resp. rate 18, height 5\' 5"  (1.651 m), weight 154 lb (69.9 kg), last menstrual period 03/29/2016, unknown if currently breastfeeding.  Physical Exam  Nursing note and vitals reviewed. Constitutional: She is oriented to person, place, and time. She appears well-developed and well-nourished. No  distress.  HENT:  Head: Normocephalic and atraumatic.  Cardiovascular: Normal rate.   Respiratory: Effort normal.  GI: Soft. She exhibits no distension and no mass. There is no tenderness. There is no rebound and no guarding.  Genitourinary: Uterus is not enlarged and not tender. Cervix exhibits no motion tenderness, no discharge and no friability. Right adnexum displays no mass and no tenderness. Left adnexum displays no mass and no tenderness. No bleeding in the vagina. Vaginal discharge (small amount of thin, white discharge noted) found.  Neurological: She is alert and oriented to person, place, and time.  Skin: Skin is warm and dry. No erythema.  Psychiatric: She has a normal mood and affect.    Results for orders placed or performed during the hospital encounter of 05/23/16 (from the past 24 hour(s))  Urinalysis, Routine w reflex microscopic     Status: Abnormal   Collection Time: 05/23/16  4:45 PM  Result Value Ref Range   Color, Urine YELLOW YELLOW   APPearance HAZY (A) CLEAR   Specific Gravity, Urine 1.024 1.005 - 1.030   pH 7.0 5.0 - 8.0   Glucose, UA NEGATIVE NEGATIVE mg/dL   Hgb urine dipstick NEGATIVE NEGATIVE   Bilirubin Urine NEGATIVE NEGATIVE   Ketones, ur 20 (A) NEGATIVE mg/dL   Protein, ur 30 (A) NEGATIVE mg/dL   Nitrite NEGATIVE NEGATIVE   Leukocytes, UA  TRACE (A) NEGATIVE   RBC / HPF 0-5 0 - 5 RBC/hpf   WBC, UA 0-5 0 - 5 WBC/hpf   Bacteria, UA RARE (A) NONE SEEN   Squamous Epithelial / LPF 6-30 (A) NONE SEEN   Mucous PRESENT   Pregnancy, urine POC     Status: Abnormal   Collection Time: 05/23/16  4:47 PM  Result Value Ref Range   Preg Test, Ur POSITIVE (A) NEGATIVE  CBC with Differential/Platelet     Status: Abnormal   Collection Time: 05/23/16  4:57 PM  Result Value Ref Range   WBC 6.3 4.0 - 10.5 K/uL   RBC 5.85 (H) 3.87 - 5.11 MIL/uL   Hemoglobin 13.4 12.0 - 15.0 g/dL   HCT 16.1 09.6 - 04.5 %   MCV 69.4 (L) 78.0 - 100.0 fL   MCH 22.9 (L) 26.0 - 34.0  pg   MCHC 33.0 30.0 - 36.0 g/dL   RDW 40.9 (H) 81.1 - 91.4 %   Platelets 162 150 - 400 K/uL   Neutrophils Relative % 67 %   Neutro Abs 4.2 1.7 - 7.7 K/uL   Lymphocytes Relative 27 %   Lymphs Abs 1.7 0.7 - 4.0 K/uL   Monocytes Relative 3 %   Monocytes Absolute 0.2 0.1 - 1.0 K/uL   Eosinophils Relative 3 %   Eosinophils Absolute 0.2 0.0 - 0.7 K/uL   Basophils Relative 0 %   Basophils Absolute 0.0 0.0 - 0.1 K/uL  hCG, quantitative, pregnancy     Status: Abnormal   Collection Time: 05/23/16  4:57 PM  Result Value Ref Range   hCG, Beta Chain, Quant, S 197,038 (H) <5 mIU/mL  Wet prep, genital     Status: Abnormal   Collection Time: 05/23/16  5:56 PM  Result Value Ref Range   Yeast Wet Prep HPF POC NONE SEEN NONE SEEN   Trich, Wet Prep NONE SEEN NONE SEEN   Clue Cells Wet Prep HPF POC PRESENT (A) NONE SEEN   WBC, Wet Prep HPF POC MODERATE (A) NONE SEEN   Sperm NONE SEEN    US Ob Comp Less 14 Wks  Result Date: 05/23/2016 CLINICAL DATA:  Anal bleeding.  First trimester of pregnancy. EXAM: OBSTETRIC <14 WK [place] AND TRANSVAGINAL OB US TECHNIQUE: Both transabdominal and transvaginal ultrasound examinations were performed for complete evaluation of the gestation as well as the maternal uterus, adnexal regions, and pelvic cul-de-sac. Transvaginal technique was performed to assess early pregnancy. COMPARISON:  Ultrasound of March 16, 2015. FINDINGS: Intrauterine gestational sac: Single. Yolk sac:  Visualized. Embryo:  Visualized. Cardiac Activity: Visualized. Heart Rate: 156  bpm CRL:  12.7  mm   7 w   3 d                  [place] EDC: January 06, 2017. Subchorionic hemorrhage:  Small subchronic hemorrhage is noted. Maternal uterus/adnexae: Ovaries appear normal, with probable corpus luteum cyst seen in left ovary. Trace free fluid is noted which is physiologic. IMPRESSION: Single live intrauterine gestation of 7 weeks 3 days. Small subchorionic hemorrhage is noted. Electronically Signed   By: Lupita Raider, M.D.   On: 05/23/2016 18:43   US Ob Transvaginal  Result Date: 05/23/2016 CLINICAL DATA:  Anal bleeding.  First trimester of pregnancy. EXAM: OBSTETRIC <14 WK [place] AND TRANSVAGINAL OB US TECHNIQUE: Both transabdominal and transvaginal ultrasound examinations were performed for complete evaluation of the gestation as well as the maternal uterus, adnexal regions, and pelvic cul-de-sac.  Transvaginal technique was performed to assess early pregnancy. COMPARISON:  Ultrasound of March 16, 2015. FINDINGS: Intrauterine gestational sac: Single. Yolk sac:  Visualized. Embryo:  Visualized. Cardiac Activity: Visualized. Heart Rate: 156  bpm CRL:  12.7  mm   7 w   3 d                  [place] EDC: January 06, 2017. Subchorionic hemorrhage:  Small subchronic hemorrhage is noted. Maternal uterus/adnexae: Ovaries appear normal, with probable corpus luteum cyst seen in left ovary. Trace free fluid is noted which is physiologic. IMPRESSION: Single live intrauterine gestation of 7 weeks 3 days. Small subchorionic hemorrhage is noted. Electronically Signed   By: Lupita RaiderJames  Green Jr, M.D.   On: 05/23/2016 18:43    MAU Course  Procedures None  MDM +UPT UA, wet prep, GC/chlamydia, CBC, quant hCG, HIV, RPR and US today to rule out ectopic pregnancy O+ blood type in Epic from previous visit  Assessment and Plan  A: SIUP at 3157w3d Small subchorionic hemorrhage Bacterial vaginosis  P: Discharge home Rx for Flagyl given to patient  First trimester precautions discussed Patient advised to follow-up with OB provider of choice to start prenatal care Pregnancy confirmation letter and list of area OB providers given  Patient may return to MAU as needed or if her condition were to change or worsen   Marny LowensteinJulie N Wenzel, PA-C  05/23/2016, 7:52 PM

## 2016-05-24 LAB — RPR: RPR Ser Ql: NONREACTIVE

## 2016-05-24 LAB — HIV ANTIBODY (ROUTINE TESTING W REFLEX): HIV SCREEN 4TH GENERATION: NONREACTIVE

## 2016-05-27 LAB — GC/CHLAMYDIA PROBE AMP (~~LOC~~) NOT AT ARMC
Chlamydia: NEGATIVE
Neisseria Gonorrhea: NEGATIVE

## 2016-07-01 ENCOUNTER — Encounter: Payer: Self-pay | Admitting: Obstetrics

## 2016-07-01 ENCOUNTER — Ambulatory Visit (INDEPENDENT_AMBULATORY_CARE_PROVIDER_SITE_OTHER): Payer: Medicaid Other | Admitting: Obstetrics

## 2016-07-01 VITALS — BP 123/79 | HR 89 | Wt 153.0 lb

## 2016-07-01 DIAGNOSIS — Z23 Encounter for immunization: Secondary | ICD-10-CM | POA: Diagnosis not present

## 2016-07-01 DIAGNOSIS — Z3492 Encounter for supervision of normal pregnancy, unspecified, second trimester: Secondary | ICD-10-CM

## 2016-07-01 DIAGNOSIS — Z3482 Encounter for supervision of other normal pregnancy, second trimester: Secondary | ICD-10-CM

## 2016-07-01 DIAGNOSIS — Z3490 Encounter for supervision of normal pregnancy, unspecified, unspecified trimester: Secondary | ICD-10-CM

## 2016-07-01 DIAGNOSIS — Z3480 Encounter for supervision of other normal pregnancy, unspecified trimester: Secondary | ICD-10-CM

## 2016-07-01 MED ORDER — PRENATE DHA 28-0.6-0.4-300 MG PO CAPS: 1.0000 | ORAL_CAPSULE | Freq: Every day | ORAL | 11 refills | Status: DC

## 2016-07-01 NOTE — Progress Notes (Signed)
Patient reports she is doing well today. 

## 2016-07-01 NOTE — Progress Notes (Signed)
Subjective:    Debra OchoaRochelle L Mignogna is being seen today for her first obstetrical visit.  This is not a planned pregnancy. She is at 2340w3d gestation. Her obstetrical history is significant for short pregnancy interval of less than 1 year. Relationship with FOB: unknown. Patient does intend to breast feed. Pregnancy history fully reviewed.  The information documented in the HPI was reviewed and verified.  Menstrual History: OB History    Gravida Para Term Preterm AB Living   2 1 1     1    SAB TAB Ectopic Multiple Live Births         0 1       Patient's last menstrual period was 03/29/2016.    Past Medical History:  Diagnosis Date  . Medical history non-contributory     Past Surgical History:  Procedure Laterality Date  . ADENOIDECTOMY    . TONSILLECTOMY       (Not in a hospital admission) Allergies  Allergen Reactions  . Shellfish Allergy Swelling and Rash    Social History  Substance Use Topics  . Smoking status: Passive Smoke Exposure - Never Smoker  . Smokeless tobacco: Never Used  . Alcohol use No    Family History  Problem Relation Age of Onset  . Hypertension Maternal Grandmother      Review of Systems Constitutional: negative for weight loss Gastrointestinal: negative for vomiting Genitourinary:negative for genital lesions and vaginal discharge and dysuria Musculoskeletal:negative for back pain Behavioral/Psych: negative for abusive relationship, depression, illegal drug usage and tobacco use    Objective:    BP 123/79   Pulse 89   Wt 153 lb (69.4 kg)   LMP 03/29/2016   BMI 25.46 kg/m  General Appearance:    Alert, cooperative, no distress, appears stated age  Head:    Normocephalic, without obvious abnormality, atraumatic  Eyes:    PERRL, conjunctiva/corneas clear, EOM's intact, fundi    benign, both eyes  Ears:    Normal TM's and external ear canals, both ears  Nose:   Nares normal, septum midline, mucosa normal, no drainage    or sinus tenderness   Throat:   Lips, mucosa, and tongue normal; teeth and gums normal  Neck:   Supple, symmetrical, trachea midline, no adenopathy;    thyroid:  no enlargement/tenderness/nodules; no carotid   bruit or JVD  Back:     Symmetric, no curvature, ROM normal, no CVA tenderness  Lungs:     Clear to auscultation bilaterally, respirations unlabored  Chest Wall:    No tenderness or deformity   Heart:    Regular rate and rhythm, S1 and S2 normal, no murmur, rub   or gallop  Breast Exam:    No tenderness, masses, or nipple abnormality  Abdomen:     Soft, non-tender, bowel sounds active all four quadrants,    no masses, no organomegaly  Genitalia:    Normal female without lesion, discharge or tenderness  Extremities:   Extremities normal, atraumatic, no cyanosis or edema  Pulses:   2+ and symmetric all extremities  Skin:   Skin color, texture, turgor normal, no rashes or lesions  Lymph nodes:   Cervical, supraclavicular, and axillary nodes normal  Neurologic:   CNII-XII intact, normal strength, sensation and reflexes    throughout      Lab Review Urine pregnancy test Labs reviewed yes Radiologic studies reviewed no  Assessment:    Pregnancy at 3540w3d weeks    Plan:      Prenatal vitamins.  Counseling provided regarding continued use of seat belts, cessation of alcohol consumption, smoking or use of illicit drugs; infection precautions i.e., influenza/TDAP immunizations, toxoplasmosis,CMV, parvovirus, listeria and varicella; workplace safety, exercise during pregnancy; routine dental care, safe medications, sexual activity, hot tubs, saunas, pools, travel, caffeine use, fish and methlymercury, potential toxins, hair treatments, varicose veins Weight gain recommendations per IOM guidelines reviewed: underweight/BMI< 18.5--> gain 28 - 40 lbs; normal weight/BMI 18.5 - 24.9--> gain 25 - 35 lbs; overweight/BMI 25 - 29.9--> gain 15 - 25 lbs; obese/BMI >30->gain  11 - 20 lbs Problem list reviewed and  updated. FIRST/CF mutation testing/NIPT/QUAD SCREEN/fragile X/Ashkenazi Jewish population testing/Spinal muscular atrophy discussed: requested. Role of ultrasound in pregnancy discussed; fetal survey: requested. Amniocentesis discussed: not indicated. VBAC calculator score: VBAC consent form provided No orders of the defined types were placed in this encounter.  Orders Placed This Encounter  Procedures  . Culture, OB Urine  . Hemoglobinopathy evaluation  . Varicella zoster antibody, IgG  . Prenatal Profile I  . ToxASSURE Select 13 (MW), Urine    Follow up in 2 weeks. 50% of 20 min visit spent on counseling and coordination of care. Patient ID: Debra Lucas, female   DOB: 1995/09/03, 21 y.o.   MRN: [id number] Patient ID: MUMTAZ LOVINS, female   DOB: [birth year], 21 y.o.   MRN: [id number]

## 2016-07-04 LAB — PRENATAL PROFILE I(LABCORP)
ANTIBODY SCREEN: NEGATIVE
Basophils Absolute: 0 10*3/uL (ref 0.0–0.2)
Basos: 0 %
EOS (ABSOLUTE): 0.1 10*3/uL (ref 0.0–0.4)
Eos: 2 %
HEMOGLOBIN: 12.7 g/dL (ref 11.1–15.9)
Hematocrit: 39.7 % (ref 34.0–46.6)
Hepatitis B Surface Ag: NEGATIVE
IMMATURE GRANS (ABS): 0 10*3/uL (ref 0.0–0.1)
IMMATURE GRANULOCYTES: 0 %
LYMPHS ABS: 1.3 10*3/uL (ref 0.7–3.1)
LYMPHS: 19 %
MCH: 23.1 pg — ABNORMAL LOW (ref 26.6–33.0)
MCHC: 32 g/dL (ref 31.5–35.7)
MCV: 72 fL — ABNORMAL LOW (ref 79–97)
Monocytes Absolute: 0.5 10*3/uL (ref 0.1–0.9)
Monocytes: 7 %
NEUTROS PCT: 72 %
Neutrophils Absolute: 5 10*3/uL (ref 1.4–7.0)
Platelets: 192 10*3/uL (ref 150–379)
RBC: 5.49 x10E6/uL — AB (ref 3.77–5.28)
RDW: 17.3 % — AB (ref 12.3–15.4)
RPR Ser Ql: NONREACTIVE
Rh Factor: POSITIVE
Rubella Antibodies, IGG: 2.44 index (ref >0.99)
WBC: 6.9 10*3/uL (ref 3.4–10.8)

## 2016-07-04 LAB — CULTURE, OB URINE

## 2016-07-04 LAB — HEMOGLOBINOPATHY EVALUATION
HEMOGLOBIN A2 QUANTITATION: 6.2 % — AB (ref 1.8–3.2)
HGB C: 0 %
HGB S: 0 %
HGB VARIANT: 0 %
Hemoglobin F Quantitation: 0 % (ref 0.0–2.0)
Hgb A: 93.8 % — ABNORMAL LOW (ref 96.4–98.8)

## 2016-07-04 LAB — VARICELLA ZOSTER ANTIBODY, IGG: Varicella zoster IgG: <135 index — ABNORMAL LOW (ref >165)

## 2016-07-04 LAB — URINE CULTURE, OB REFLEX

## 2016-07-06 LAB — TOXASSURE SELECT 13 (MW), URINE

## 2016-07-16 ENCOUNTER — Ambulatory Visit (INDEPENDENT_AMBULATORY_CARE_PROVIDER_SITE_OTHER): Payer: Medicaid Other | Admitting: Obstetrics and Gynecology

## 2016-07-16 DIAGNOSIS — O09299 Supervision of pregnancy with other poor reproductive or obstetric history, unspecified trimester: Secondary | ICD-10-CM | POA: Insufficient documentation

## 2016-07-16 DIAGNOSIS — Z8619 Personal history of other infectious and parasitic diseases: Secondary | ICD-10-CM | POA: Insufficient documentation

## 2016-07-16 DIAGNOSIS — O09899 Supervision of other high risk pregnancies, unspecified trimester: Secondary | ICD-10-CM | POA: Insufficient documentation

## 2016-07-16 DIAGNOSIS — O09292 Supervision of pregnancy with other poor reproductive or obstetric history, second trimester: Secondary | ICD-10-CM

## 2016-07-16 DIAGNOSIS — Z3490 Encounter for supervision of normal pregnancy, unspecified, unspecified trimester: Secondary | ICD-10-CM

## 2016-07-16 DIAGNOSIS — Z8759 Personal history of other complications of pregnancy, childbirth and the puerperium: Secondary | ICD-10-CM | POA: Insufficient documentation

## 2016-07-16 MED ORDER — ASPIRIN EC 81 MG PO TBEC: 81.0000 mg | DELAYED_RELEASE_TABLET | Freq: Every day | ORAL | 0 refills | Status: DC

## 2016-07-16 NOTE — Patient Instructions (Signed)

## 2016-07-16 NOTE — Progress Notes (Signed)
Subjective:  Debra OchoaRochelle L Lucas is a 21 y.o. G2P1001 at 6555w4d being seen today for ongoing prenatal care.  She is currently monitored for the following issues for this low-risk pregnancy and has Supervision of normal pregnancy, antepartum; History of group B Streptococcus (GBS) infection; Hx of preeclampsia, prior pregnancy, currently pregnant; and Short interval between pregnancies affecting pregnancy, antepartum on her problem list.  Patient reports no complaints.  Contractions: Not present. Vag. Bleeding: None.   . Denies leaking of fluid.   The following portions of the patient's history were reviewed and updated as appropriate: allergies, current medications, past family history, past medical history, past social history, past surgical history and problem list. Problem list updated.  Objective:   Vitals:   07/16/16 1401  BP: 115/74  Pulse: 98  Weight: 153 lb (69.4 kg)    Fetal Status: Fetal Heart Rate (bpm): 149         General:  Alert, oriented and cooperative. Patient is in no acute distress.  Skin: Skin is warm and dry. No rash noted.   Cardiovascular: Normal heart rate noted  Respiratory: Normal respiratory effort, no problems with respiration noted  Abdomen: Soft, gravid, appropriate for gestational age. Pain/Pressure: Absent     Pelvic:  Cervical exam deferred        Extremities: Normal range of motion.  Edema: None  Mental Status: Normal mood and affect. Normal behavior. Normal judgment and thought content.   Urinalysis:      Assessment and Plan:  Pregnancy: G2P1001 at 1955w4d  1. Encounter for supervision of normal pregnancy, antepartum, unspecified gravidity Stable Anatomy scan ordered - AFP, Quad Screen  2. History of group B Streptococcus (GBS) infection Tx while in labor  3. Hx of preeclampsia, prior pregnancy, currently pregnant Will check CMP and Prot/Crt ratio Start BASA qd  4. Short interval between pregnancies affecting pregnancy,  antepartum   Preterm labor symptoms and general obstetric precautions including but not limited to vaginal bleeding, contractions, leaking of fluid and fetal movement were reviewed in detail with the patient. Please refer to After Visit Summary for other counseling recommendations.  Return in about 4 weeks (around 08/13/2016) for OB visit.   Debra StaggersMichael L Penn Grissett, MD

## 2016-07-17 LAB — PROTEIN / CREATININE RATIO, URINE
Creatinine, Urine: 199.9 mg/dL
PROTEIN UR: 17.6 mg/dL
PROTEIN/CREAT RATIO: 88 mg/g{creat} (ref 0–200)

## 2016-07-23 LAB — AFP, QUAD SCREEN
DIA Mom Value: 1.6
DIA Value (EIA): 295.38 pg/mL
DSR (By Age)    1 IN: 1151
DSR (Second Trimester) 1 IN: 591
GESTATIONAL AGE AFP: 15.6 wk
MSAFP MOM: 0.76
MSAFP: 25.5 ng/mL
MSHCG Mom: 1.39
MSHCG: 60347 m[IU]/mL
Maternal Age At EDD: 20.8 YEARS
Osb Risk: 10000
TEST RESULTS AFP: NEGATIVE
UE3 MOM: 0.77
WEIGHT: 153 [lb_av]
uE3 Value: 0.55 ng/mL

## 2016-07-23 LAB — COMPREHENSIVE METABOLIC PANEL WITH GFR
ALT: 8 IU/L (ref 0–32)
AST: 13 IU/L (ref 0–40)
Albumin/Globulin Ratio: 1.4 (ref 1.2–2.2)
Albumin: 4 g/dL (ref 3.5–5.5)
Alkaline Phosphatase: 43 IU/L (ref 39–117)
BUN/Creatinine Ratio: 10 (ref 9–23)
BUN: 7 mg/dL (ref 6–20)
Bilirubin Total: 0.4 mg/dL (ref 0.0–1.2)
CO2: 21 mmol/L (ref 18–29)
Calcium: 9.2 mg/dL (ref 8.7–10.2)
Chloride: 99 mmol/L (ref 96–106)
Creatinine, Ser: 0.67 mg/dL (ref 0.57–1.00)
GFR calc Af Amer: 146 mL/min/1.73
GFR calc non Af Amer: 127 mL/min/1.73
Globulin, Total: 2.9 g/dL (ref 1.5–4.5)
Glucose: 82 mg/dL (ref 65–99)
Potassium: 4 mmol/L (ref 3.5–5.2)
Sodium: 138 mmol/L (ref 134–144)
Total Protein: 6.9 g/dL (ref 6.0–8.5)

## 2016-08-06 ENCOUNTER — Ambulatory Visit (HOSPITAL_COMMUNITY)
Admission: RE | Admit: 2016-08-06 | Discharge: 2016-08-06 | Disposition: A | Discharge status: Home / Self Care | Payer: Medicaid Other | Source: Ambulatory Visit | Attending: Obstetrics and Gynecology | Admitting: Obstetrics and Gynecology

## 2016-08-06 DIAGNOSIS — Z3490 Encounter for supervision of normal pregnancy, unspecified, unspecified trimester: Secondary | ICD-10-CM

## 2016-08-06 DIAGNOSIS — Z3A18 18 weeks gestation of pregnancy: Secondary | ICD-10-CM | POA: Insufficient documentation

## 2016-08-06 DIAGNOSIS — Z3689 Encounter for other specified antenatal screening: Secondary | ICD-10-CM | POA: Insufficient documentation

## 2016-08-06 DIAGNOSIS — O09292 Supervision of pregnancy with other poor reproductive or obstetric history, second trimester: Secondary | ICD-10-CM | POA: Insufficient documentation

## 2016-08-06 DIAGNOSIS — O09299 Supervision of pregnancy with other poor reproductive or obstetric history, unspecified trimester: Secondary | ICD-10-CM

## 2016-08-07 NOTE — Addendum Note (Signed)
Addended by: Maretta BeesMCGLASHAN, CAROL J on: 08/07/2016 01:54 PM   Modules accepted: Orders

## 2016-08-11 ENCOUNTER — Encounter (HOSPITAL_COMMUNITY): Payer: Self-pay

## 2016-08-11 ENCOUNTER — Emergency Department (HOSPITAL_COMMUNITY)
Admission: EM | Admit: 2016-08-11 | Discharge: 2016-08-12 | Disposition: A | Discharge status: Home / Self Care | Payer: Medicaid Other | Attending: Emergency Medicine | Admitting: Emergency Medicine

## 2016-08-11 DIAGNOSIS — O219 Vomiting of pregnancy, unspecified: Secondary | ICD-10-CM

## 2016-08-11 DIAGNOSIS — Z7722 Contact with and (suspected) exposure to environmental tobacco smoke (acute) (chronic): Secondary | ICD-10-CM | POA: Diagnosis not present

## 2016-08-11 DIAGNOSIS — O210 Mild hyperemesis gravidarum: Secondary | ICD-10-CM | POA: Diagnosis not present

## 2016-08-11 DIAGNOSIS — Z7982 Long term (current) use of aspirin: Secondary | ICD-10-CM | POA: Insufficient documentation

## 2016-08-11 DIAGNOSIS — Z3A19 19 weeks gestation of pregnancy: Secondary | ICD-10-CM | POA: Diagnosis not present

## 2016-08-11 DIAGNOSIS — O0281 Inappropriate change in quantitative human chorionic gonadotropin (hCG) in early pregnancy: Secondary | ICD-10-CM | POA: Diagnosis not present

## 2016-08-11 LAB — COMPREHENSIVE METABOLIC PANEL
ALK PHOS: 50 U/L (ref 38–126)
ALT: 9 U/L — ABNORMAL LOW (ref 14–54)
ANION GAP: 13 (ref 5–15)
AST: 21 U/L (ref 15–41)
Albumin: 3.3 g/dL — ABNORMAL LOW (ref 3.5–5.0)
BUN: 8 mg/dL (ref 6–20)
CHLORIDE: 102 mmol/L (ref 101–111)
CO2: 22 mmol/L (ref 22–32)
CREATININE: 0.78 mg/dL (ref 0.44–1.00)
Calcium: 9.1 mg/dL (ref 8.9–10.3)
Glucose, Bld: 95 mg/dL (ref 65–99)
Potassium: 3.4 mmol/L — ABNORMAL LOW (ref 3.5–5.1)
SODIUM: 137 mmol/L (ref 135–145)
Total Bilirubin: 1.1 mg/dL (ref 0.3–1.2)
Total Protein: 7.4 g/dL (ref 6.5–8.1)

## 2016-08-11 LAB — CBC
HEMATOCRIT: 38.6 % (ref 36.0–46.0)
HEMOGLOBIN: 12.7 g/dL (ref 12.0–15.0)
MCH: 24 pg — AB (ref 26.0–34.0)
MCHC: 32.9 g/dL (ref 30.0–36.0)
MCV: 72.8 fL — AB (ref 78.0–100.0)
PLATELETS: 153 10*3/uL (ref 150–400)
RBC: 5.3 MIL/uL — AB (ref 3.87–5.11)
RDW: 16.1 % — ABNORMAL HIGH (ref 11.5–15.5)
WBC: 8 10*3/uL (ref 4.0–10.5)

## 2016-08-11 LAB — LIPASE, BLOOD: Lipase: 16 U/L (ref 11–51)

## 2016-08-11 LAB — HCG, QUANTITATIVE, PREGNANCY: hCG, Beta Chain, Quant, S: 14843 m[IU]/mL — ABNORMAL HIGH (ref <5)

## 2016-08-11 LAB — URINALYSIS, ROUTINE W REFLEX MICROSCOPIC
Glucose, UA: NEGATIVE mg/dL
HGB URINE DIPSTICK: NEGATIVE
KETONES UR: 80 mg/dL — AB
Nitrite: NEGATIVE
PH: 5 (ref 5.0–8.0)
Protein, ur: 100 mg/dL — AB
Specific Gravity, Urine: 1.03 (ref 1.005–1.030)

## 2016-08-11 LAB — I-STAT BETA HCG BLOOD, ED (MC, WL, AP ONLY): I-stat hCG, quantitative: >2000 m[IU]/mL — ABNORMAL HIGH (ref <5)

## 2016-08-11 MED ORDER — SODIUM CHLORIDE 0.9 % IV BOLUS (SEPSIS)
2000.0000 mL | Freq: Once | INTRAVENOUS | Status: AC
  Administered 2016-08-11: 2000 mL via INTRAVENOUS

## 2016-08-11 MED ORDER — ONDANSETRON HCL 4 MG/2ML IJ SOLN
4.0000 mg | Freq: Once | INTRAMUSCULAR | Status: AC
  Administered 2016-08-12: 4 mg via INTRAVENOUS
  Filled 2016-08-11: qty 2

## 2016-08-11 MED ORDER — SODIUM CHLORIDE 0.9 % IV BOLUS (SEPSIS): 1000.0000 mL | Freq: Once | INTRAVENOUS | Status: DC

## 2016-08-11 NOTE — ED Triage Notes (Addendum)
Pt reports she has had abdominal pain associated with constant nausea and vomiting today with inability to tolerate PO. Pt reports she is [redacted] weeks pregnant. Denies vaginal bleeding or discharge.

## 2016-08-11 NOTE — ED Provider Notes (Signed)
[facility]-EMERGENCY DEPT Provider Note   CSN: [id number] Arrival date & time: 08/11/16  2112    History   Chief Complaint Chief Complaint  Patient presents with  . Emesis During Pregnancy    HPI Debra Lucas is a 21 y.o. female.   21 year old female, presently 19 weeks and 2 days pregnant presents to the emergency department for evaluation of nausea and vomiting. She states that she awoke with nausea and vomiting this morning. She had [place] food to eat last night. She has had approximately 10 episodes of nonbloody, nonbilious emesis. Patient unable to tolerate POs as a result. She c/o feeling hungry and thirsty. She reports some generalized upper abdominal discomfort. No medications taken prior to arrival for symptoms. Last episode of emesis was at 1900. She denies any vaginal bleeding, vaginal discharge, dysuria, or hematuria. No recent fevers. No history of abdominal surgeries.     Past Medical History:  Diagnosis Date  . Medical history non-contributory     Patient Active Problem List   Diagnosis Date Noted  . History of group B Streptococcus (GBS) infection 07/16/2016  . Hx of preeclampsia, prior pregnancy, currently pregnant 07/16/2016  . Short interval between pregnancies affecting pregnancy, antepartum 07/16/2016  . Supervision of normal pregnancy, antepartum 07/01/2016    Past Surgical History:  Procedure Laterality Date  . ADENOIDECTOMY    . TONSILLECTOMY      OB History    Gravida Para Term Preterm AB Living   2 1 1     1    SAB TAB Ectopic Multiple Live Births         0 1       Home Medications    Prior to Admission medications   Medication Sig Start Date End Date Taking? Authorizing Provider  aspirin EC 81 MG tablet Take 1 tablet (81 mg total) by mouth daily. Take after 12 weeks for prevention of preeclampsia later in pregnancy 07/16/16   Hermina Staggers, MD  metoCLOPramide (REGLAN) 10 MG tablet Take 1 tablet (10 mg total) by mouth every 8 (eight)  hours as needed for nausea or vomiting. 08/12/16   Antony Madura, PA-C  Prenat w/o A-FE-Methfol-FA-DHA (PRENATE DHA) 28-0.6-0.4-300 MG CAPS Take 1 capsule by mouth daily before breakfast. 07/01/16   Brock Bad, MD    Family History Family History  Problem Relation Age of Onset  . Hypertension Maternal Grandmother     Social History Social History  Substance Use Topics  . Smoking status: Passive Smoke Exposure - Never Smoker  . Smokeless tobacco: Never Used  . Alcohol use No     Allergies   Shellfish allergy   Review of Systems Review of Systems Ten systems reviewed and are negative for acute change, except as noted in the HPI.    Physical Exam Updated Vital Signs BP (!) 99/52   Pulse (!) 101   Temp 98.4 F (36.9 C) (Oral)   Resp 20   LMP 03/29/2016   SpO2 100%   Physical Exam  Constitutional: She is oriented to person, place, and time. She appears well-developed and well-nourished. No distress.  Nontoxic and in NAD  HENT:  Head: Normocephalic and atraumatic.  Eyes: Conjunctivae and EOM are normal. No scleral icterus.  Neck: Normal range of motion.  Cardiovascular: Regular rhythm and intact distal pulses.   Tachycardia to 110bpm  Pulmonary/Chest: Effort normal. No respiratory distress. She has no wheezes. She has no rales.  Lungs CTAB. Respirations even and unlabored.  Abdominal: Soft. She  exhibits no mass. There is no guarding.  Gravid abdomen with mild epigastric and LLQ tenderness. No peritoneal signs. No guarding.  Musculoskeletal: Normal range of motion.  Neurological: She is alert and oriented to person, place, and time. She exhibits normal muscle tone. Coordination normal.  Skin: Skin is warm and dry. No rash noted. She is not diaphoretic. No erythema. No pallor.  Psychiatric: She has a normal mood and affect. Her behavior is normal.  Nursing note and vitals reviewed.    ED Treatments / Results  Labs (all labs ordered are listed, but only abnormal  results are displayed) Labs Reviewed  COMPREHENSIVE METABOLIC PANEL - Abnormal; Notable for the following:       Result Value   Potassium 3.4 (*)    Albumin 3.3 (*)    ALT 9 (*)    All other components within normal limits  CBC - Abnormal; Notable for the following:    RBC 5.30 (*)    MCV 72.8 (*)    MCH 24.0 (*)    RDW 16.1 (*)    All other components within normal limits  URINALYSIS, ROUTINE W REFLEX MICROSCOPIC - Abnormal; Notable for the following:    Color, Urine AMBER (*)    APPearance HAZY (*)    Bilirubin Urine SMALL (*)    Ketones, ur 80 (*)    Protein, ur 100 (*)    Leukocytes, UA TRACE (*)    Bacteria, UA RARE (*)    Squamous Epithelial / LPF 6-30 (*)    All other components within normal limits  HCG, QUANTITATIVE, PREGNANCY - Abnormal; Notable for the following:    hCG, Beta Chain, Quant, S 14,843 (*)    All other components within normal limits  I-STAT BETA HCG BLOOD, ED ([facility], [facility], [facility] ONLY) - Abnormal; Notable for the following:    I-stat hCG, quantitative >2,000.0 (*)    All other components within normal limits  LIPASE, BLOOD    EKG  EKG Interpretation None       Radiology No results found.   EMERGENCY DEPARTMENT [place] PREGNANCY "Study: Limited Ultrasound of the Pelvis for Pregnancy"  INDICATIONS:Pregnancy(required) and Abdominal or pelvic pain Multiple views of the uterus and pelvic cavity were obtained in real-time with a multi-frequency probe.  APPROACH:Transabdominal  PERFORMED BY: Myself IMAGES ARCHIVED?: Yes LIMITATIONS: none GESTATIONAL AGE, ESTIMATE: 19 weeks FETAL HEART RATE: present, WNL INTERPRETATION: Fetal heart activity seen and normal IUP with active fetal movement   Procedures Procedures (including critical care time)  Medications Ordered in ED Medications  sodium chloride 0.9 % bolus 2,000 mL (2,000 mLs Intravenous New Bag/Given 08/11/16 2332)  ondansetron (ZOFRAN) injection 4 mg (4 mg Intravenous Given 08/12/16 0000)      Initial Impression / Assessment and Plan / ED Course  I have reviewed the triage vital signs and the nursing notes.  Pertinent labs & imaging results that were available during my care of the patient were reviewed by me and considered in my medical decision making (see chart for details).    11:45 PM Ultrasound performed at bedside. Fetal heart rate is within normal limits. Fetus noted to be active. Ultrasound images archived. Given that pregnancy is still nonviable ex-utero, I do not believe a formal ultrasound or other emergent imaging is indicated to evaluate pregnancy.  1:10 AM Patient reassessed. She has no complaints of pain. Abdominal exam is improved. Patient has been able to tolerate water without vomiting. She does not complain of nausea. Tachycardia has significantly improved  after 2 L of IV fluids. Low suspicion for infectious etiology given lack of fever and leukocytosis. Liver function tests are preserved. Negative Murphy's sign. Also low suspicion for cholelithiasis at this time. No evidence of UTI on urinalysis today.  Patient states that she is feeling much better. She expresses comfort with outpatient management. She has follow-up scheduled with her OB/GYN tomorrow. Plan to provide prescription for antiemetics. Return precautions discussed and provided. Patient discharged in stable condition with no unaddressed concerns.   Vitals:   08/11/16 2118 08/12/16 0000  BP: 126/86 (!) 99/52  Pulse: (!) 138 (!) 101  Resp: 20 20  Temp: 98.4 F (36.9 C)   TempSrc: Oral   SpO2: 100% 100%    Final Clinical Impressions(s) / ED Diagnoses   Final diagnoses:  Nausea and vomiting during pregnancy prior to [redacted] weeks gestation    New Prescriptions New Prescriptions   METOCLOPRAMIDE (REGLAN) 10 MG TABLET    Take 1 tablet (10 mg total) by mouth every 8 (eight) hours as needed for nausea or vomiting.     Antony MaduraKelly Rhegan Trunnell, PA-C 08/12/16 0118    Lavera Guiseana Duo Liu, MD 08/12/16 [phone]

## 2016-08-12 MED ORDER — METOCLOPRAMIDE HCL 10 MG PO TABS: 10.0000 mg | ORAL_TABLET | Freq: Three times a day (TID) | ORAL | 0 refills | Status: DC | PRN

## 2016-08-12 NOTE — Discharge Instructions (Signed)
You may take Reglan as needed for nausea or vomiting. Be sure to drink plenty of clear liquids. We recommend at least 8 8-ounce glasses of water each day. Keep your appointment with your OB/GYN. [facility] if symptoms persist or worsen. Take Tylenol as needed for pain.

## 2016-08-13 ENCOUNTER — Ambulatory Visit (INDEPENDENT_AMBULATORY_CARE_PROVIDER_SITE_OTHER): Payer: Medicaid Other | Admitting: Obstetrics & Gynecology

## 2016-08-13 DIAGNOSIS — Z3480 Encounter for supervision of other normal pregnancy, unspecified trimester: Secondary | ICD-10-CM

## 2016-08-13 DIAGNOSIS — Z3482 Encounter for supervision of other normal pregnancy, second trimester: Secondary | ICD-10-CM

## 2016-08-13 NOTE — Progress Notes (Signed)
   PRENATAL VISIT NOTE  Subjective:  Debra Lucas is a 21 y.o. G2P1001 at [redacted]w[redacted]d being seen today for ongoing prenatal care.  She is currently monitored for the following issues for this low-risk pregnancy and has Supervision of normal pregnancy, antepartum; History of group B Streptococcus (GBS) infection; Hx of preeclampsia, prior pregnancy, currently pregnant; and Short interval between pregnancies affecting pregnancy, antepartum on her problem list.  Patient reports no complaints.  Contractions: Not present. Vag. Bleeding: None.  Movement: Present. Denies leaking of fluid.   The following portions of the patient's history were reviewed and updated as appropriate: allergies, current medications, past family history, past medical history, past social history, past surgical history and problem list. Problem list updated.  Objective:   Vitals:   08/13/16 1430  BP: 100/65  Pulse: 74  Weight: 159 lb (72.1 kg)    Fetal Status: Fetal Heart Rate (bpm): 145   Movement: Present     General:  Alert, oriented and cooperative. Patient is in no acute distress.  Skin: Skin is warm and dry. No rash noted.   Cardiovascular: Normal heart rate noted  Respiratory: Normal respiratory effort, no problems with respiration noted  Abdomen: Soft, gravid, appropriate for gestational age. Pain/Pressure: Absent     Pelvic:  Cervical exam deferred        Extremities: Normal range of motion.     Mental Status: Normal mood and affect. Normal behavior. Normal judgment and thought content.   Assessment and Plan:  Pregnancy: G2P1001 at 215w4d  1. Supervision of other normal pregnancy, antepartum Normal anatomy scan - AFP, Quad Screen  Preterm labor symptoms and general obstetric precautions including but not limited to vaginal bleeding, contractions, leaking of fluid and fetal movement were reviewed in detail with the patient. Please refer to After Visit Summary for other counseling recommendations.    Return in about 4 weeks (around 09/10/2016).   Adam PhenixJames G Elliette Seabolt, MD

## 2016-08-13 NOTE — Patient Instructions (Signed)

## 2016-08-17 LAB — AFP, QUAD SCREEN
DIA Mom Value: 0.84
DIA Value (EIA): 150.15 pg/mL
DSR (By Age)    1 IN: 1151
DSR (Second Trimester) 1 IN: 10000
Gestational Age: 19.6 wk
MSAFP Mom: 0.67
MSAFP: 37.5 ng/mL
MSHCG Mom: 0.58
MSHCG: 13555 m[IU]/mL
Maternal Age At EDD: 20.8 a
Osb Risk: 10000
T18 (By Age): 1:4486 {titer}
Test Results:: NEGATIVE
Weight: 159 [lb_av]
uE3 Mom: 1.07
uE3 Value: 1.78 ng/mL

## 2016-09-02 ENCOUNTER — Ambulatory Visit (HOSPITAL_COMMUNITY)
Admission: RE | Admit: 2016-09-02 | Discharge: 2016-09-02 | Disposition: A | Discharge status: Home / Self Care | Payer: Medicaid Other | Source: Ambulatory Visit | Attending: Obstetrics and Gynecology | Admitting: Obstetrics and Gynecology

## 2016-09-02 ENCOUNTER — Other Ambulatory Visit: Payer: Self-pay | Admitting: Obstetrics and Gynecology

## 2016-09-02 DIAGNOSIS — O09899 Supervision of other high risk pregnancies, unspecified trimester: Secondary | ICD-10-CM

## 2016-09-02 DIAGNOSIS — Z362 Encounter for other antenatal screening follow-up: Secondary | ICD-10-CM

## 2016-09-02 DIAGNOSIS — Z3A22 22 weeks gestation of pregnancy: Secondary | ICD-10-CM | POA: Insufficient documentation

## 2016-09-02 DIAGNOSIS — O09299 Supervision of pregnancy with other poor reproductive or obstetric history, unspecified trimester: Secondary | ICD-10-CM

## 2016-09-02 DIAGNOSIS — Z3490 Encounter for supervision of normal pregnancy, unspecified, unspecified trimester: Secondary | ICD-10-CM

## 2016-09-02 DIAGNOSIS — O09292 Supervision of pregnancy with other poor reproductive or obstetric history, second trimester: Secondary | ICD-10-CM | POA: Diagnosis not present

## 2016-09-02 DIAGNOSIS — Z8619 Personal history of other infectious and parasitic diseases: Secondary | ICD-10-CM | POA: Diagnosis not present

## 2016-09-02 DIAGNOSIS — O09892 Supervision of other high risk pregnancies, second trimester: Secondary | ICD-10-CM | POA: Insufficient documentation

## 2016-09-10 ENCOUNTER — Ambulatory Visit (INDEPENDENT_AMBULATORY_CARE_PROVIDER_SITE_OTHER): Payer: Medicaid Other | Admitting: Obstetrics and Gynecology

## 2016-09-10 VITALS — BP 111/73 | HR 106 | Wt 162.9 lb

## 2016-09-10 DIAGNOSIS — O09299 Supervision of pregnancy with other poor reproductive or obstetric history, unspecified trimester: Secondary | ICD-10-CM

## 2016-09-10 DIAGNOSIS — O09899 Supervision of other high risk pregnancies, unspecified trimester: Secondary | ICD-10-CM

## 2016-09-10 DIAGNOSIS — Z3480 Encounter for supervision of other normal pregnancy, unspecified trimester: Secondary | ICD-10-CM

## 2016-09-10 DIAGNOSIS — O09292 Supervision of pregnancy with other poor reproductive or obstetric history, second trimester: Secondary | ICD-10-CM

## 2016-09-10 DIAGNOSIS — O09892 Supervision of other high risk pregnancies, second trimester: Secondary | ICD-10-CM

## 2016-09-10 NOTE — Progress Notes (Signed)
Patient reports good fetal movement, denies pain. 

## 2016-09-10 NOTE — Progress Notes (Signed)
   PRENATAL VISIT NOTE  Subjective:  Debra Lucas is a 21 y.o. G2P1001 at [redacted]w[redacted]d being seen today for ongoing prenatal care.  She is currently monitored for the following issues for this low-risk pregnancy and has Supervision of normal pregnancy, antepartum; History of group B Streptococcus (GBS) infection; Hx of preeclampsia, prior pregnancy, currently pregnant; and Short interval between pregnancies affecting pregnancy, antepartum on her problem list.  Patient reports no complaints.  Contractions: Not present. Vag. Bleeding: None.  Movement: Present. Denies leaking of fluid.   The following portions of the patient's history were reviewed and updated as appropriate: allergies, current medications, past family history, past medical history, past social history, past surgical history and problem list. Problem list updated.  Objective:   Vitals:   09/10/16 1532  BP: 111/73  Pulse: (!) 106  Weight: 162 lb 14.4 oz (73.9 kg)    Fetal Status: Fetal Heart Rate (bpm): 152 Fundal Height: 24 cm Movement: Present     General:  Alert, oriented and cooperative. Patient is in no acute distress.  Skin: Skin is warm and dry. No rash noted.   Cardiovascular: Normal heart rate noted  Respiratory: Normal respiratory effort, no problems with respiration noted  Abdomen: Soft, gravid, appropriate for gestational age. Pain/Pressure: Absent     Pelvic:  Cervical exam deferred        Extremities: Normal range of motion.  Edema: None  Mental Status: Normal mood and affect. Normal behavior. Normal judgment and thought content.   Assessment and Plan:  Pregnancy: G2P1001 at [redacted]w[redacted]d  1. Supervision of other normal pregnancy, antepartum Patient is doing well without complaints Third trimester labs and glucola next visit Anatomy ultrasound reviewed with the patient  2. Hx of preeclampsia, prior pregnancy, currently pregnant Continue ASA  3. Short interval between pregnancies affecting pregnancy,  antepartum   Preterm labor symptoms and general obstetric precautions including but not limited to vaginal bleeding, contractions, leaking of fluid and fetal movement were reviewed in detail with the patient. Please refer to After Visit Summary for other counseling recommendations.  Return in about 4 weeks (around 10/08/2016) for ROB, 2 hr glucola next visit.   Catalina Antigua, MD

## 2016-10-08 ENCOUNTER — Ambulatory Visit (INDEPENDENT_AMBULATORY_CARE_PROVIDER_SITE_OTHER): Payer: Medicaid Other | Admitting: Obstetrics & Gynecology

## 2016-10-08 ENCOUNTER — Other Ambulatory Visit: Payer: Medicaid Other

## 2016-10-08 VITALS — BP 107/70 | HR 97 | Wt 164.5 lb

## 2016-10-08 DIAGNOSIS — Z3482 Encounter for supervision of other normal pregnancy, second trimester: Secondary | ICD-10-CM

## 2016-10-08 DIAGNOSIS — O09299 Supervision of pregnancy with other poor reproductive or obstetric history, unspecified trimester: Secondary | ICD-10-CM

## 2016-10-08 DIAGNOSIS — O09292 Supervision of pregnancy with other poor reproductive or obstetric history, second trimester: Secondary | ICD-10-CM

## 2016-10-08 DIAGNOSIS — Z3480 Encounter for supervision of other normal pregnancy, unspecified trimester: Secondary | ICD-10-CM

## 2016-10-08 NOTE — Progress Notes (Signed)
Patient reports good fetal movement, denies pain. 

## 2016-10-08 NOTE — Patient Instructions (Signed)

## 2016-10-08 NOTE — Progress Notes (Signed)
   PRENATAL VISIT NOTE  Subjective:  Luiz OchoaRochelle L Mcclune is a 21 y.o. G2P1001 at 1478w4d being seen today for ongoing prenatal care.  She is currently monitored for the following issues for this low-risk pregnancy and has Supervision of normal pregnancy, antepartum; History of group B Streptococcus (GBS) infection; Hx of preeclampsia, prior pregnancy, currently pregnant; and Short interval between pregnancies affecting pregnancy, antepartum on her problem list.  Patient reports nasal allergy symptoms.  Contractions: Not present. Vag. Bleeding: None.  Movement: Present. Denies leaking of fluid.   The following portions of the patient's history were reviewed and updated as appropriate: allergies, current medications, past family history, past medical history, past social history, past surgical history and problem list. Problem list updated.  Objective:   Vitals:   10/08/16 0903  BP: 107/70  Pulse: 97  Weight: 164 lb 8 oz (74.6 kg)    Fetal Status: Fetal Heart Rate (bpm): 144   Movement: Present     General:  Alert, oriented and cooperative. Patient is in no acute distress.  Skin: Skin is warm and dry. No rash noted.   Cardiovascular: Normal heart rate noted  Respiratory: Normal respiratory effort, no problems with respiration noted  Abdomen: Soft, gravid, appropriate for gestational age. Pain/Pressure: Absent     Pelvic:  Cervical exam deferred        Extremities: Normal range of motion.  Edema: None  Mental Status: Normal mood and affect. Normal behavior. Normal judgment and thought content.   Assessment and Plan:  Pregnancy: G2P1001 at 578w4d  1. Supervision of other normal pregnancy, antepartum  - Glucose Tolerance, 2 Hours w/1 Hour - CBC - HIV antibody (with reflex) - RPR  2. Hx of preeclampsia, prior pregnancy, currently pregnant Normal BP May take Benadryl or Claritin for her allergies Preterm labor symptoms and general obstetric precautions including but not limited to  vaginal bleeding, contractions, leaking of fluid and fetal movement were reviewed in detail with the patient. Please refer to After Visit Summary for other counseling recommendations.  Return in about 2 weeks (around 10/22/2016).   Scheryl DarterJames Arnold, MD

## 2016-10-09 LAB — GLUCOSE TOLERANCE, 2 HOURS W/ 1HR
GLUCOSE, FASTING: 78 mg/dL (ref 65–91)
Glucose, 1 hour: 102 mg/dL (ref 65–179)
Glucose, 2 hour: 92 mg/dL (ref 65–152)

## 2016-10-09 LAB — CBC
Hematocrit: 33.4 % — ABNORMAL LOW (ref 34.0–46.6)
Hemoglobin: 10.7 g/dL — ABNORMAL LOW (ref 11.1–15.9)
MCH: 23.7 pg — ABNORMAL LOW (ref 26.6–33.0)
MCHC: 32 g/dL (ref 31.5–35.7)
MCV: 74 fL — ABNORMAL LOW (ref 79–97)
PLATELETS: 151 10*3/uL (ref 150–379)
RBC: 4.52 x10E6/uL (ref 3.77–5.28)
RDW: 16.3 % — ABNORMAL HIGH (ref 12.3–15.4)
WBC: 6.4 10*3/uL (ref 3.4–10.8)

## 2016-10-09 LAB — RPR: RPR Ser Ql: NONREACTIVE

## 2016-10-09 LAB — HIV ANTIBODY (ROUTINE TESTING W REFLEX): HIV SCREEN 4TH GENERATION: NONREACTIVE

## 2016-10-23 ENCOUNTER — Ambulatory Visit (INDEPENDENT_AMBULATORY_CARE_PROVIDER_SITE_OTHER): Payer: Medicaid Other | Admitting: Obstetrics and Gynecology

## 2016-10-23 VITALS — BP 102/70 | HR 92 | Wt 166.0 lb

## 2016-10-23 DIAGNOSIS — Z3483 Encounter for supervision of other normal pregnancy, third trimester: Secondary | ICD-10-CM

## 2016-10-23 DIAGNOSIS — O09299 Supervision of pregnancy with other poor reproductive or obstetric history, unspecified trimester: Secondary | ICD-10-CM

## 2016-10-23 DIAGNOSIS — Z8619 Personal history of other infectious and parasitic diseases: Secondary | ICD-10-CM

## 2016-10-23 DIAGNOSIS — O09293 Supervision of pregnancy with other poor reproductive or obstetric history, third trimester: Secondary | ICD-10-CM

## 2016-10-23 DIAGNOSIS — Z3480 Encounter for supervision of other normal pregnancy, unspecified trimester: Secondary | ICD-10-CM

## 2016-10-23 NOTE — Progress Notes (Signed)
Subjective:  Debra Lucas is a 21 y.o. G2P1001 at 2446w5d being seen today for ongoing prenatal care.  She is currently monitored for the following issues for this high-risk pregnancy and has Supervision of normal pregnancy, antepartum; History of group B Streptococcus (GBS) infection; Hx of preeclampsia, prior pregnancy, currently pregnant; and Short interval between pregnancies affecting pregnancy, antepartum on her problem list.  Patient reports no complaints.  Contractions: Not present. Vag. Bleeding: None.  Movement: Present. Denies leaking of fluid.   The following portions of the patient's history were reviewed and updated as appropriate: allergies, current medications, past family history, past medical history, past social history, past surgical history and problem list. Problem list updated.  Objective:   Vitals:   10/23/16 1400  BP: 102/70  Pulse: 92  Weight: 166 lb (75.3 kg)    Fetal Status:     Movement: Present     General:  Alert, oriented and cooperative. Patient is in no acute distress.  Skin: Skin is warm and dry. No rash noted.   Cardiovascular: Normal heart rate noted  Respiratory: Normal respiratory effort, no problems with respiration noted  Abdomen: Soft, gravid, appropriate for gestational age. Pain/Pressure: Absent     Pelvic:  Cervical exam deferred        Extremities: Normal range of motion.     Mental Status: Normal mood and affect. Normal behavior. Normal judgment and thought content.   Urinalysis:      Assessment and Plan:  Pregnancy: G2P1001 at 9046w5d  1. Hx of preeclampsia, prior pregnancy, currently pregnant Stable BP NL Continue with BASA  2. History of group B Streptococcus (GBS) infection Tx while in labor  3. Supervision of other normal pregnancy, antepartum Stable  Preterm labor symptoms and general obstetric precautions including but not limited to vaginal bleeding, contractions, leaking of fluid and fetal movement were reviewed in  detail with the patient. Please refer to After Visit Summary for other counseling recommendations.  Return in about 2 weeks (around 11/06/2016) for OB visit.   Hermina StaggersErvin, Rowdy Guerrini L, MD

## 2016-11-06 ENCOUNTER — Ambulatory Visit (INDEPENDENT_AMBULATORY_CARE_PROVIDER_SITE_OTHER): Payer: Medicaid Other | Admitting: Obstetrics and Gynecology

## 2016-11-06 VITALS — BP 108/72 | HR 118 | Wt 163.0 lb

## 2016-11-06 DIAGNOSIS — O09299 Supervision of pregnancy with other poor reproductive or obstetric history, unspecified trimester: Secondary | ICD-10-CM

## 2016-11-06 DIAGNOSIS — Z3480 Encounter for supervision of other normal pregnancy, unspecified trimester: Secondary | ICD-10-CM

## 2016-11-06 DIAGNOSIS — O09292 Supervision of pregnancy with other poor reproductive or obstetric history, second trimester: Secondary | ICD-10-CM

## 2016-11-06 DIAGNOSIS — Z3482 Encounter for supervision of other normal pregnancy, second trimester: Secondary | ICD-10-CM

## 2016-11-06 DIAGNOSIS — Z8619 Personal history of other infectious and parasitic diseases: Secondary | ICD-10-CM

## 2016-11-06 NOTE — Progress Notes (Signed)
Subjective:  Luiz OchoaRochelle L Lamphier is a 21 y.o. G2P1001 at 6777w5d being seen today for ongoing prenatal care.  She is currently monitored for the following issues for this high-risk pregnancy and has Supervision of normal pregnancy, antepartum; History of group B Streptococcus (GBS) infection; Hx of preeclampsia, prior pregnancy, currently pregnant; and Short interval between pregnancies affecting pregnancy, antepartum on her problem list.  Patient reports no complaints.  Contractions: Irritability. Vag. Bleeding: None.  Movement: Present. Denies leaking of fluid.   The following portions of the patient's history were reviewed and updated as appropriate: allergies, current medications, past family history, past medical history, past social history, past surgical history and problem list. Problem list updated.  Objective:   Vitals:   11/06/16 1556  BP: 108/72  Pulse: (!) 118  Weight: 163 lb (73.9 kg)    Fetal Status: Fetal Heart Rate (bpm): 142   Movement: Present     General:  Alert, oriented and cooperative. Patient is in no acute distress.  Skin: Skin is warm and dry. No rash noted.   Cardiovascular: Normal heart rate noted  Respiratory: Normal respiratory effort, no problems with respiration noted  Abdomen: Soft, gravid, appropriate for gestational age. Pain/Pressure: Absent     Pelvic:  Cervical exam deferred        Extremities: Normal range of motion.  Edema: None  Mental Status: Normal mood and affect. Normal behavior. Normal judgment and thought content.   Urinalysis:      Assessment and Plan:  Pregnancy: G2P1001 at 2077w5d  1. Hx of preeclampsia, prior pregnancy, currently pregnant Stable Continue with BASA  2. History of group B Streptococcus (GBS) infection Tx while in labor  3. Supervision of other normal pregnancy, antepartum Stable  Preterm labor symptoms and general obstetric precautions including but not limited to vaginal bleeding, contractions, leaking of fluid  and fetal movement were reviewed in detail with the patient. Please refer to After Visit Summary for other counseling recommendations.  Return in about 2 weeks (around 11/20/2016) for OB visit.   Hermina StaggersErvin, Torianne Laflam L, MD

## 2016-11-25 ENCOUNTER — Ambulatory Visit (INDEPENDENT_AMBULATORY_CARE_PROVIDER_SITE_OTHER): Payer: Medicaid Other | Admitting: Obstetrics and Gynecology

## 2016-11-25 VITALS — BP 118/75 | HR 99 | Wt 169.0 lb

## 2016-11-25 DIAGNOSIS — O09299 Supervision of pregnancy with other poor reproductive or obstetric history, unspecified trimester: Secondary | ICD-10-CM

## 2016-11-25 DIAGNOSIS — Z3483 Encounter for supervision of other normal pregnancy, third trimester: Secondary | ICD-10-CM

## 2016-11-25 DIAGNOSIS — Z3480 Encounter for supervision of other normal pregnancy, unspecified trimester: Secondary | ICD-10-CM

## 2016-11-25 DIAGNOSIS — O09293 Supervision of pregnancy with other poor reproductive or obstetric history, third trimester: Secondary | ICD-10-CM

## 2016-11-25 DIAGNOSIS — Z8619 Personal history of other infectious and parasitic diseases: Secondary | ICD-10-CM

## 2016-11-25 NOTE — Patient Instructions (Signed)
Third Trimester of Pregnancy The third trimester is from week 28 through week 40 (months 7 through 9). The third trimester is a time when the unborn baby (fetus) is growing rapidly. At the end of the ninth month, the fetus is about 20 inches in length and weighs 6-10 pounds. Body changes during your third trimester Your body will continue to go through many changes during pregnancy. The changes vary from woman to woman. During the third trimester:  Your weight will continue to increase. You can expect to gain 25-35 pounds (11-16 kg) by the end of the pregnancy.  You may begin to get stretch marks on your hips, abdomen, and breasts.  You may urinate more often because the fetus is moving lower into your pelvis and pressing on your bladder.  You may develop or continue to have heartburn. This is caused by increased hormones that slow down muscles in the digestive tract.  You may develop or continue to have constipation because increased hormones slow digestion and cause the muscles that push waste through your intestines to relax.  You may develop hemorrhoids. These are swollen veins (varicose veins) in the rectum that can itch or be painful.  You may develop swollen, bulging veins (varicose veins) in your legs.  You may have increased body aches in the pelvis, back, or thighs. This is due to weight gain and increased hormones that are relaxing your joints.  You may have changes in your hair. These can include thickening of your hair, rapid growth, and changes in texture. Some women also have hair loss during or after pregnancy, or hair that feels dry or thin. Your hair will most likely return to normal after your baby is born.  Your breasts will continue to grow and they will continue to become tender. A yellow fluid (colostrum) may leak from your breasts. This is the first milk you are producing for your baby.  Your belly button may stick out.  You may notice more swelling in your hands,  face, or ankles.  You may have increased tingling or numbness in your hands, arms, and legs. The skin on your belly may also feel numb.  You may feel short of breath because of your expanding uterus.  You may have more problems sleeping. This can be caused by the size of your belly, increased need to urinate, and an increase in your body's metabolism.  You may notice the fetus "dropping," or moving lower in your abdomen (lightening).  You may have increased vaginal discharge.  You may notice your joints feel loose and you may have pain around your pelvic bone.  What to expect at prenatal visits You will have prenatal exams every 2 weeks until week 36. Then you will have weekly prenatal exams. During a routine prenatal visit:  You will be weighed to make sure you and the baby are growing normally.  Your blood pressure will be taken.  Your abdomen will be measured to track your baby's growth.  The fetal heartbeat will be listened to.  Any test results from the previous visit will be discussed.  You may have a cervical check near your due date to see if your cervix has softened or thinned (effaced).  You will be tested for Group B streptococcus. This happens between 35 and 37 weeks.  Your health care provider may ask you:  What your birth plan is.  How you are feeling.  If you are feeling the baby move.  If you have had   any abnormal symptoms, such as leaking fluid, bleeding, severe headaches, or abdominal cramping.  If you are using any tobacco products, including cigarettes, chewing tobacco, and electronic cigarettes.  If you have any questions.  Other tests or screenings that may be performed during your third trimester include:  Blood tests that check for low iron levels (anemia).  Fetal testing to check the health, activity level, and growth of the fetus. Testing is done if you have certain medical conditions or if there are problems during the  pregnancy.  Nonstress test (NST). This test checks the health of your baby to make sure there are no signs of problems, such as the baby not getting enough oxygen. During this test, a belt is placed around your belly. The baby is made to move, and its heart rate is monitored during movement.  What is false labor? False labor is a condition in which you feel small, irregular tightenings of the muscles in the womb (contractions) that usually go away with rest, changing position, or drinking water. These are called Braxton Hicks contractions. Contractions may last for hours, days, or even weeks before true labor sets in. If contractions come at regular intervals, become more frequent, increase in intensity, or become painful, you should see your health care provider. What are the signs of labor?  Abdominal cramps.  Regular contractions that start at 10 minutes apart and become stronger and more frequent with time.  Contractions that start on the top of the uterus and spread down to the lower abdomen and back.  Increased pelvic pressure and dull back pain.  A watery or bloody mucus discharge that comes from the vagina.  Leaking of amniotic fluid. This is also known as your "water breaking." It could be a slow trickle or a gush. Let your health care provider know if it has a color or strange odor. If you have any of these signs, call your health care provider right away, even if it is before your due date. Follow these instructions at home: Medicines  Follow your health care provider's instructions regarding medicine use. Specific medicines may be either safe or unsafe to take during pregnancy.  Take a prenatal vitamin that contains at least 600 micrograms (mcg) of folic acid.  If you develop constipation, try taking a stool softener if your health care provider approves. Eating and drinking  Eat a balanced diet that includes fresh fruits and vegetables, whole grains, good sources of protein  such as meat, eggs, or tofu, and low-fat dairy. Your health care provider will help you determine the amount of weight gain that is right for you.  Avoid raw meat and uncooked cheese. These carry germs that can cause birth defects in the baby.  If you have low calcium intake from food, talk to your health care provider about whether you should take a daily calcium supplement.  Eat four or five small meals rather than three large meals a day.  Limit foods that are high in fat and processed sugars, such as fried and sweet foods.  To prevent constipation: ? Drink enough fluid to keep your urine clear or pale yellow. ? Eat foods that are high in fiber, such as fresh fruits and vegetables, whole grains, and beans. Activity  Exercise only as directed by your health care provider. Most women can continue their usual exercise routine during pregnancy. Try to exercise for 30 minutes at least 5 days a week. Stop exercising if you experience uterine contractions.  Avoid heavy   lifting.  Do not exercise in extreme heat or humidity, or at high altitudes.  Wear low-heel, comfortable shoes.  Practice good posture.  You may continue to have sex unless your health care provider tells you otherwise. Relieving pain and discomfort  Take frequent breaks and rest with your legs elevated if you have leg cramps or low back pain.  Take warm sitz baths to soothe any pain or discomfort caused by hemorrhoids. Use hemorrhoid cream if your health care provider approves.  Wear a good support bra to prevent discomfort from breast tenderness.  If you develop varicose veins: ? Wear support pantyhose or compression stockings as told by your healthcare provider. ? Elevate your feet for 15 minutes, 3-4 times a day. Prenatal care  Write down your questions. Take them to your prenatal visits.  Keep all your prenatal visits as told by your health care provider. This is important. Safety  Wear your seat belt at  all times when driving.  Make a list of emergency phone numbers, including numbers for family, friends, the hospital, and police and fire departments. General instructions  Avoid cat litter boxes and soil used by cats. These carry germs that can cause birth defects in the baby. If you have a cat, ask someone to clean the litter box for you.  Do not travel far distances unless it is absolutely necessary and only with the approval of your health care provider.  Do not use hot tubs, steam rooms, or saunas.  Do not drink alcohol.  Do not use any products that contain nicotine or tobacco, such as cigarettes and e-cigarettes. If you need help quitting, ask your health care provider.  Do not use any medicinal herbs or unprescribed drugs. These chemicals affect the formation and growth of the baby.  Do not douche or use tampons or scented sanitary pads.  Do not cross your legs for long periods of time.  To prepare for the arrival of your baby: ? Take prenatal classes to understand, practice, and ask questions about labor and delivery. ? Make a trial run to the hospital. ? Visit the hospital and tour the maternity area. ? Arrange for maternity or paternity leave through employers. ? Arrange for family and friends to take care of pets while you are in the hospital. ? Purchase a rear-facing car seat and make sure you know how to install it in your car. ? Pack your hospital bag. ? Prepare the baby's nursery. Make sure to remove all pillows and stuffed animals from the baby's crib to prevent suffocation.  Visit your dentist if you have not gone during your pregnancy. Use a soft toothbrush to brush your teeth and be gentle when you floss. Contact a health care provider if:  You are unsure if you are in labor or if your water has broken.  You become dizzy.  You have mild pelvic cramps, pelvic pressure, or nagging pain in your abdominal area.  You have lower back pain.  You have persistent  nausea, vomiting, or diarrhea.  You have an unusual or bad smelling vaginal discharge.  You have pain when you urinate. Get help right away if:  Your water breaks before 37 weeks.  You have regular contractions less than 5 minutes apart before 37 weeks.  You have a fever.  You are leaking fluid from your vagina.  You have spotting or bleeding from your vagina.  You have severe abdominal pain or cramping.  You have rapid weight loss or weight gain.    You have shortness of breath with chest pain.  You notice sudden or extreme swelling of your face, hands, ankles, feet, or legs.  Your baby makes fewer than 10 movements in 2 hours.  You have severe headaches that do not go away when you take medicine.  You have vision changes. Summary  The third trimester is from week 28 through week 40, months 7 through 9. The third trimester is a time when the unborn baby (fetus) is growing rapidly.  During the third trimester, your discomfort may increase as you and your baby continue to gain weight. You may have abdominal, leg, and back pain, sleeping problems, and an increased need to urinate.  During the third trimester your breasts will keep growing and they will continue to become tender. A yellow fluid (colostrum) may leak from your breasts. This is the first milk you are producing for your baby.  False labor is a condition in which you feel small, irregular tightenings of the muscles in the womb (contractions) that eventually go away. These are called Braxton Hicks contractions. Contractions may last for hours, days, or even weeks before true labor sets in.  Signs of labor can include: abdominal cramps; regular contractions that start at 10 minutes apart and become stronger and more frequent with time; watery or bloody mucus discharge that comes from the vagina; increased pelvic pressure and dull back pain; and leaking of amniotic fluid. This information is not intended to replace advice  given to you by your health care provider. Make sure you discuss any questions you have with your health care provider. Document Released: 04/30/2001 Document Revised: 10/12/2015 Document Reviewed: 07/07/2012 Elsevier Interactive Patient Education  2017 [employer]  

## 2016-11-25 NOTE — Progress Notes (Signed)
Subjective:  Luiz OchoaRochelle L Twyford is a 21 y.o. G2P1001 at 6465w3d being seen today for ongoing prenatal care.  She is currently monitored for the following issues for this high-risk pregnancy and has Supervision of normal pregnancy, antepartum; History of group B Streptococcus (GBS) infection; Hx of preeclampsia, prior pregnancy, currently pregnant; and Short interval between pregnancies affecting pregnancy, antepartum on her problem list.  Patient reports no complaints.  Contractions: Not present. Vag. Bleeding: None.  Movement: Present. Denies leaking of fluid.   The following portions of the patient's history were reviewed and updated as appropriate: allergies, current medications, past family history, past medical history, past social history, past surgical history and problem list. Problem list updated.  Objective:   Vitals:   11/25/16 1601  BP: 118/75  Pulse: 99  Weight: 169 lb (76.7 kg)    Fetal Status: Fetal Heart Rate (bpm): 140   Movement: Present     General:  Alert, oriented and cooperative. Patient is in no acute distress.  Skin: Skin is warm and dry. No rash noted.   Cardiovascular: Normal heart rate noted  Respiratory: Normal respiratory effort, no problems with respiration noted  Abdomen: Soft, gravid, appropriate for gestational age. Pain/Pressure: Absent     Pelvic:  Cervical exam deferred        Extremities: Normal range of motion.  Edema: None  Mental Status: Normal mood and affect. Normal behavior. Normal judgment and thought content.   Urinalysis:      Assessment and Plan:  Pregnancy: G2P1001 at 7065w3d  1. Supervision of other normal pregnancy, antepartum Stable Cultures next ROB  2. History of group B Streptococcus (GBS) infection Tx while in labor  3. Hx of preeclampsia, prior pregnancy, currently pregnant BP stable No meds Continue with BASA  Preterm labor symptoms and general obstetric precautions including but not limited to vaginal bleeding,  contractions, leaking of fluid and fetal movement were reviewed in detail with the patient. Please refer to After Visit Summary for other counseling recommendations.  Return in about 2 weeks (around 12/09/2016) for OB visit.   Hermina StaggersErvin, Yuval Nolet L, MD

## 2016-12-09 ENCOUNTER — Ambulatory Visit (INDEPENDENT_AMBULATORY_CARE_PROVIDER_SITE_OTHER): Payer: Medicaid Other | Admitting: Obstetrics and Gynecology

## 2016-12-09 ENCOUNTER — Other Ambulatory Visit (HOSPITAL_COMMUNITY)
Admission: RE | Admit: 2016-12-09 | Discharge: 2016-12-09 | Disposition: A | Discharge status: Home / Self Care | Payer: Medicaid Other | Source: Ambulatory Visit | Attending: Obstetrics and Gynecology | Admitting: Obstetrics and Gynecology

## 2016-12-09 VITALS — BP 122/76 | HR 110 | Wt 174.7 lb

## 2016-12-09 DIAGNOSIS — Z3483 Encounter for supervision of other normal pregnancy, third trimester: Secondary | ICD-10-CM | POA: Diagnosis not present

## 2016-12-09 DIAGNOSIS — O09293 Supervision of pregnancy with other poor reproductive or obstetric history, third trimester: Secondary | ICD-10-CM

## 2016-12-09 DIAGNOSIS — O09299 Supervision of pregnancy with other poor reproductive or obstetric history, unspecified trimester: Secondary | ICD-10-CM

## 2016-12-09 DIAGNOSIS — Z3A36 36 weeks gestation of pregnancy: Secondary | ICD-10-CM | POA: Insufficient documentation

## 2016-12-09 DIAGNOSIS — O09899 Supervision of other high risk pregnancies, unspecified trimester: Secondary | ICD-10-CM

## 2016-12-09 DIAGNOSIS — Z3480 Encounter for supervision of other normal pregnancy, unspecified trimester: Secondary | ICD-10-CM

## 2016-12-09 NOTE — Progress Notes (Signed)
   PRENATAL VISIT NOTE  Subjective:  Debra Lucas is a 21 y.o. G2P1001 at 7026w3d being seen today for ongoing prenatal care.  She is currently monitored for the following issues for this high-risk pregnancy and has Supervision of normal pregnancy, antepartum; History of group B Streptococcus (GBS) infection; Hx of preeclampsia, prior pregnancy, currently pregnant; and Short interval between pregnancies affecting pregnancy, antepartum on her problem list.  Patient reports no complaints.  Contractions: Not present. Vag. Bleeding: None.  Movement: Present. Denies leaking of fluid.   The following portions of the patient's history were reviewed and updated as appropriate: allergies, current medications, past family history, past medical history, past social history, past surgical history and problem list. Problem list updated.  Objective:   Vitals:   12/09/16 1346  BP: 122/76  Pulse: (!) 110  Weight: 174 lb 11.2 oz (79.2 kg)    Fetal Status: Fetal Heart Rate (bpm): 136 Fundal Height: 36 cm Movement: Present  Presentation: Vertex  General:  Alert, oriented and cooperative. Patient is in no acute distress.  Skin: Skin is warm and dry. No rash noted.   Cardiovascular: Normal heart rate noted  Respiratory: Normal respiratory effort, no problems with respiration noted  Abdomen: Soft, gravid, appropriate for gestational age.  Pain/Pressure: Absent     Pelvic: Cervical exam performed Dilation: 1 Effacement (%): Thick Station: -3  Extremities: Normal range of motion.  Edema: None  Mental Status:  Normal mood and affect. Normal behavior. Normal judgment and thought content.   Assessment and Plan:  Pregnancy: G2P1001 at 2526w3d  1. Supervision of other normal pregnancy, antepartum Patient is doing well without complaints Cultures collected today - Cervicovaginal ancillary only - Strep Gp B Culture+Rflx  2. Hx of preeclampsia, prior pregnancy, currently pregnant Continue ASA until  7/27  3. Short interval between pregnancies affecting pregnancy, antepartum Patient remains undecided on contraception  Preterm labor symptoms and general obstetric precautions including but not limited to vaginal bleeding, contractions, leaking of fluid and fetal movement were reviewed in detail with the patient. Please refer to After Visit Summary for other counseling recommendations.  No Follow-up on file.   Catalina AntiguaPeggy Macie Baum, MD

## 2016-12-10 LAB — CERVICOVAGINAL ANCILLARY ONLY
Chlamydia: NEGATIVE
Neisseria Gonorrhea: NEGATIVE

## 2016-12-14 ENCOUNTER — Encounter: Payer: Self-pay | Admitting: Obstetrics and Gynecology

## 2016-12-14 DIAGNOSIS — O99820 Streptococcus B carrier state complicating pregnancy: Secondary | ICD-10-CM | POA: Insufficient documentation

## 2016-12-14 LAB — STREP GP B SUSCEPTIBILITY

## 2016-12-14 LAB — STREP GP B CULTURE+RFLX: Strep Gp B Culture+Rflx: POSITIVE — AB

## 2016-12-14 LAB — OB RESULTS CONSOLE GBS: GBS: POSITIVE

## 2016-12-18 ENCOUNTER — Ambulatory Visit (INDEPENDENT_AMBULATORY_CARE_PROVIDER_SITE_OTHER): Payer: Medicaid Other | Admitting: Obstetrics & Gynecology

## 2016-12-18 DIAGNOSIS — Z3490 Encounter for supervision of normal pregnancy, unspecified, unspecified trimester: Secondary | ICD-10-CM

## 2016-12-18 DIAGNOSIS — Z3483 Encounter for supervision of other normal pregnancy, third trimester: Secondary | ICD-10-CM

## 2016-12-18 NOTE — Progress Notes (Signed)
   PRENATAL VISIT NOTE  Subjective:  Debra Lucas is a 21 y.o. G2P1001 at 4969w5d being seen today for ongoing prenatal care.  She is currently monitored for the following issues for this low-risk pregnancy and has Supervision of normal pregnancy, antepartum; History of group B Streptococcus (GBS) infection; Hx of preeclampsia, prior pregnancy, currently pregnant; Short interval between pregnancies affecting pregnancy, antepartum; and GBS (group B Streptococcus carrier), +RV culture, currently pregnant on her problem list.  Patient reports no complaints.  Contractions: Not present. Vag. Bleeding: None.  Movement: Present. Denies leaking of fluid.   The following portions of the patient's history were reviewed and updated as appropriate: allergies, current medications, past family history, past medical history, past social history, past surgical history and problem list. Problem list updated.  Objective:   Vitals:   12/18/16 1605  BP: 111/71  Pulse: 97  Weight: 174 lb 11.2 oz (79.2 kg)    Fetal Status: Fetal Heart Rate (bpm): 145   Movement: Present     General:  Alert, oriented and cooperative. Patient is in no acute distress.  Skin: Skin is warm and dry. No rash noted.   Cardiovascular: Normal heart rate noted  Respiratory: Normal respiratory effort, no problems with respiration noted  Abdomen: Soft, gravid, appropriate for gestational age.  Pain/Pressure: Absent     Pelvic: Cervical exam deferred        Extremities: Normal range of motion.  Edema: None  Mental Status:  Normal mood and affect. Normal behavior. Normal judgment and thought content.   Assessment and Plan:  Pregnancy: G2P1001 at 2369w5d  1. Encounter for supervision of normal pregnancy, antepartum, unspecified gravidity Doing well  Term labor symptoms and general obstetric precautions including but not limited to vaginal bleeding, contractions, leaking of fluid and fetal movement were reviewed in detail with the  patient. Please refer to After Visit Summary for other counseling recommendations.  Return in about 1 week (around 12/25/2016).   Scheryl DarterJames Netha Dafoe, MD

## 2016-12-18 NOTE — Patient Instructions (Signed)
Vaginal Delivery Vaginal delivery means that you will give birth by pushing your baby out of your birth canal (vagina). A team of health care providers will help you before, during, and after vaginal delivery. Birth experiences are unique for every woman and every pregnancy, and birth experiences vary depending on where you choose to give birth. What should I do to prepare for my baby's birth? Before your baby is born, it is important to talk with your health care provider about:  Your labor and delivery preferences. These may include: ? Medicines that you may be given. ? How you will manage your pain. This might include non-medical pain relief techniques or injectable pain relief such as epidural analgesia. ? How you and your baby will be monitored during labor and delivery. ? Who may be in the labor and delivery room with you. ? Your feelings about surgical delivery of your baby (cesarean delivery, or C-section) if this becomes necessary. ? Your feelings about receiving donated blood through an IV tube (blood transfusion) if this becomes necessary.  Whether you are able: ? To take pictures or videos of the birth. ? To eat during labor and delivery. ? To move around, walk, or change positions during labor and delivery.  What to expect after your baby is born, such as: ? Whether delayed umbilical cord clamping and cutting is offered. ? Who will care for your baby right after birth. ? Medicines or tests that may be recommended for your baby. ? Whether breastfeeding is supported in your hospital or birth center. ? How long you will be in the hospital or birth center.  How any medical conditions you have may affect your baby or your labor and delivery experience.  To prepare for your baby's birth, you should also:  Attend all of your health care visits before delivery (prenatal visits) as recommended by your health care provider. This is important.  Prepare your home for your baby's  arrival. Make sure that you have: ? Diapers. ? Baby clothing. ? Feeding equipment. ? Safe sleeping arrangements for you and your baby.  Install a car seat in your vehicle. Have your car seat checked by a certified car seat installer to make sure that it is installed safely.  Think about who will help you with your new baby at home for at least the first several weeks after delivery.  What can I expect when I arrive at the birth center or hospital? Once you are in labor and have been admitted into the hospital or birth center, your health care provider may:  Review your pregnancy history and any concerns you have.  Insert an IV tube into one of your veins. This is used to give you fluids and medicines.  Check your blood pressure, pulse, temperature, and heart rate (vital signs).  Check whether your bag of water (amniotic sac) has broken (ruptured).  Talk with you about your birth plan and discuss pain control options.  Monitoring Your health care provider may monitor your contractions (uterine monitoring) and your baby's heart rate (fetal monitoring). You may need to be monitored:  Often, but not continuously (intermittently).  All the time or for long periods at a time (continuously). Continuous monitoring may be needed if: ? You are taking certain medicines, such as medicine to relieve pain or make your contractions stronger. ? You have pregnancy or labor complications.  Monitoring may be done by:  Placing a special stethoscope or a handheld monitoring device on your abdomen to   check your baby's heartbeat, and feeling your abdomen for contractions. This method of monitoring does not continuously record your baby's heartbeat or your contractions.  Placing monitors on your abdomen (external monitors) to record your baby's heartbeat and the frequency and length of contractions. You may not have to wear external monitors all the time.  Placing monitors inside of your uterus  (internal monitors) to record your baby's heartbeat and the frequency, length, and strength of your contractions. ? Your health care provider may use internal monitors if he or she needs more information about the strength of your contractions or your baby's heart rate. ? Internal monitors are put in place by passing a thin, flexible wire through your vagina and into your uterus. Depending on the type of monitor, it may remain in your uterus or on your baby's head until birth. ? Your health care provider will discuss the benefits and risks of internal monitoring with you and will ask for your permission before inserting the monitors.  Telemetry. This is a type of continuous monitoring that can be done with external or internal monitors. Instead of having to stay in bed, you are able to move around during telemetry. Ask your health care provider if telemetry is an option for you.  Physical exam Your health care provider may perform a physical exam. This may include:  Checking whether your baby is positioned: ? With the head toward your vagina (head-down). This is most common. ? With the head toward the top of your uterus (head-up or breech). If your baby is in a breech position, your health care provider may try to turn your baby to a head-down position so you can deliver vaginally. If it does not seem that your baby can be born vaginally, your provider may recommend surgery to deliver your baby. In rare cases, you may be able to deliver vaginally if your baby is head-up (breech delivery). ? Lying sideways (transverse). Babies that are lying sideways cannot be delivered vaginally.  Checking your cervix to determine: ? Whether it is thinning out (effacing). ? Whether it is opening up (dilating). ? How low your baby has moved into your birth canal.  What are the three stages of labor and delivery?  Normal labor and delivery is divided into the following three stages: Stage 1  Stage 1 is the  longest stage of labor, and it can last for hours or days. Stage 1 includes: ? Early labor. This is when contractions may be irregular, or regular and mild. Generally, early labor contractions are more than 10 minutes apart. ? Active labor. This is when contractions get longer, more regular, more frequent, and more intense. ? The transition phase. This is when contractions happen very close together, are very intense, and may last longer than during any other part of labor.  Contractions generally feel mild, infrequent, and irregular at first. They get stronger, more frequent (about every 2-3 minutes), and more regular as you progress from early labor through active labor and transition.  Many women progress through stage 1 naturally, but you may need help to continue making progress. If this happens, your health care provider may talk with you about: ? Rupturing your amniotic sac if it has not ruptured yet. ? Giving you medicine to help make your contractions stronger and more frequent.  Stage 1 ends when your cervix is completely dilated to 4 inches (10 cm) and completely effaced. This happens at the end of the transition phase. Stage 2  Once   your cervix is completely effaced and dilated to 4 inches (10 cm), you may start to feel an urge to push. It is common for the body to naturally take a rest before feeling the urge to push, especially if you received an epidural or certain other pain medicines. This rest period may last for up to 1-2 hours, depending on your unique labor experience.  During stage 2, contractions are generally less painful, because pushing helps relieve contraction pain. Instead of contraction pain, you may feel stretching and burning pain, especially when the widest part of your baby's head passes through the vaginal opening (crowning).  Your health care provider will closely monitor your pushing progress and your baby's progress through the vagina during stage 2.  Your  health care provider may massage the area of skin between your vaginal opening and anus (perineum) or apply warm compresses to your perineum. This helps it stretch as the baby's head starts to crown, which can help prevent perineal tearing. ? In some cases, an incision may be made in your perineum (episiotomy) to allow the baby to pass through the vaginal opening. An episiotomy helps to make the opening of the vagina larger to allow more room for the baby to fit through.  It is very important to breathe and focus so your health care provider can control the delivery of your baby's head. Your health care provider may have you decrease the intensity of your pushing, to help prevent perineal tearing.  After delivery of your baby's head, the shoulders and the rest of the body generally deliver very quickly and without difficulty.  Once your baby is delivered, the umbilical cord may be cut right away, or this may be delayed for 1-2 minutes, depending on your baby's health. This may vary among health care providers, hospitals, and birth centers.  If you and your baby are healthy enough, your baby may be placed on your chest or abdomen to help maintain the baby's temperature and to help you bond with each other. Some mothers and babies start breastfeeding at this time. Your health care team will dry your baby and help keep your baby warm during this time.  Your baby may need immediate care if he or she: ? Showed signs of distress during labor. ? Has a medical condition. ? Was born too early (prematurely). ? Had a bowel movement before birth (meconium). ? Shows signs of difficulty transitioning from being inside the uterus to being outside of the uterus. If you are planning to breastfeed, your health care team will help you begin a feeding. Stage 3  The third stage of labor starts immediately after the birth of your baby and ends after you deliver the placenta. The placenta is an organ that develops  during pregnancy to provide oxygen and nutrients to your baby in the womb.  Delivering the placenta may require some pushing, and you may have mild contractions. Breastfeeding can stimulate contractions to help you deliver the placenta.  After the placenta is delivered, your uterus should tighten (contract) and become firm. This helps to stop bleeding in your uterus. To help your uterus contract and to control bleeding, your health care provider may: ? Give you medicine by injection, through an IV tube, by mouth, or through your rectum (rectally). ? Massage your abdomen or perform a vaginal exam to remove any blood clots that are left in your uterus. ? Empty your bladder by placing a thin, flexible tube (catheter) into your bladder. ? Encourage   you to breastfeed your baby. After labor is over, you and your baby will be monitored closely to ensure that you are both healthy until you are ready to go home. Your health care team will teach you how to care for yourself and your baby. This information is not intended to replace advice given to you by your health care provider. Make sure you discuss any questions you have with your health care provider. Document Released: 02/13/2008 Document Revised: 11/24/2015 Document Reviewed: 05/21/2015 Elsevier Interactive Patient Education  2018 [employer]  

## 2016-12-25 ENCOUNTER — Encounter: Payer: Medicaid Other | Admitting: Obstetrics & Gynecology

## 2016-12-27 ENCOUNTER — Encounter: Payer: Self-pay | Admitting: *Deleted

## 2016-12-27 DIAGNOSIS — Z3490 Encounter for supervision of normal pregnancy, unspecified, unspecified trimester: Secondary | ICD-10-CM

## 2016-12-31 ENCOUNTER — Telehealth: Payer: Self-pay | Admitting: *Deleted

## 2016-12-31 NOTE — Telephone Encounter (Signed)
Message has been sent to pt through Babyscript app in order to remind her of her BP and weight entries needed to stay current in Babyscripts.

## 2017-01-01 ENCOUNTER — Ambulatory Visit (INDEPENDENT_AMBULATORY_CARE_PROVIDER_SITE_OTHER): Payer: Medicaid Other | Admitting: Obstetrics & Gynecology

## 2017-01-01 VITALS — BP 118/80 | HR 102 | Wt 178.0 lb

## 2017-01-01 DIAGNOSIS — Z3490 Encounter for supervision of normal pregnancy, unspecified, unspecified trimester: Secondary | ICD-10-CM

## 2017-01-01 DIAGNOSIS — Z3483 Encounter for supervision of other normal pregnancy, third trimester: Secondary | ICD-10-CM

## 2017-01-01 NOTE — Progress Notes (Signed)
Patient does not want to have her cervix checked.

## 2017-01-01 NOTE — Patient Instructions (Signed)
Vaginal Delivery Vaginal delivery means that you will give birth by pushing your baby out of your birth canal (vagina). A team of health care providers will help you before, during, and after vaginal delivery. Birth experiences are unique for every woman and every pregnancy, and birth experiences vary depending on where you choose to give birth. What should I do to prepare for my baby's birth? Before your baby is born, it is important to talk with your health care provider about:  Your labor and delivery preferences. These may include: ? Medicines that you may be given. ? How you will manage your pain. This might include non-medical pain relief techniques or injectable pain relief such as epidural analgesia. ? How you and your baby will be monitored during labor and delivery. ? Who may be in the labor and delivery room with you. ? Your feelings about surgical delivery of your baby (cesarean delivery, or C-section) if this becomes necessary. ? Your feelings about receiving donated blood through an IV tube (blood transfusion) if this becomes necessary.  Whether you are able: ? To take pictures or videos of the birth. ? To eat during labor and delivery. ? To move around, walk, or change positions during labor and delivery.  What to expect after your baby is born, such as: ? Whether delayed umbilical cord clamping and cutting is offered. ? Who will care for your baby right after birth. ? Medicines or tests that may be recommended for your baby. ? Whether breastfeeding is supported in your hospital or birth center. ? How long you will be in the hospital or birth center.  How any medical conditions you have may affect your baby or your labor and delivery experience.  To prepare for your baby's birth, you should also:  Attend all of your health care visits before delivery (prenatal visits) as recommended by your health care provider. This is important.  Prepare your home for your baby's  arrival. Make sure that you have: ? Diapers. ? Baby clothing. ? Feeding equipment. ? Safe sleeping arrangements for you and your baby.  Install a car seat in your vehicle. Have your car seat checked by a certified car seat installer to make sure that it is installed safely.  Think about who will help you with your new baby at home for at least the first several weeks after delivery.  What can I expect when I arrive at the birth center or hospital? Once you are in labor and have been admitted into the hospital or birth center, your health care provider may:  Review your pregnancy history and any concerns you have.  Insert an IV tube into one of your veins. This is used to give you fluids and medicines.  Check your blood pressure, pulse, temperature, and heart rate (vital signs).  Check whether your bag of water (amniotic sac) has broken (ruptured).  Talk with you about your birth plan and discuss pain control options.  Monitoring Your health care provider may monitor your contractions (uterine monitoring) and your baby's heart rate (fetal monitoring). You may need to be monitored:  Often, but not continuously (intermittently).  All the time or for long periods at a time (continuously). Continuous monitoring may be needed if: ? You are taking certain medicines, such as medicine to relieve pain or make your contractions stronger. ? You have pregnancy or labor complications.  Monitoring may be done by:  Placing a special stethoscope or a handheld monitoring device on your abdomen to   check your baby's heartbeat, and feeling your abdomen for contractions. This method of monitoring does not continuously record your baby's heartbeat or your contractions.  Placing monitors on your abdomen (external monitors) to record your baby's heartbeat and the frequency and length of contractions. You may not have to wear external monitors all the time.  Placing monitors inside of your uterus  (internal monitors) to record your baby's heartbeat and the frequency, length, and strength of your contractions. ? Your health care provider may use internal monitors if he or she needs more information about the strength of your contractions or your baby's heart rate. ? Internal monitors are put in place by passing a thin, flexible wire through your vagina and into your uterus. Depending on the type of monitor, it may remain in your uterus or on your baby's head until birth. ? Your health care provider will discuss the benefits and risks of internal monitoring with you and will ask for your permission before inserting the monitors.  Telemetry. This is a type of continuous monitoring that can be done with external or internal monitors. Instead of having to stay in bed, you are able to move around during telemetry. Ask your health care provider if telemetry is an option for you.  Physical exam Your health care provider may perform a physical exam. This may include:  Checking whether your baby is positioned: ? With the head toward your vagina (head-down). This is most common. ? With the head toward the top of your uterus (head-up or breech). If your baby is in a breech position, your health care provider may try to turn your baby to a head-down position so you can deliver vaginally. If it does not seem that your baby can be born vaginally, your provider may recommend surgery to deliver your baby. In rare cases, you may be able to deliver vaginally if your baby is head-up (breech delivery). ? Lying sideways (transverse). Babies that are lying sideways cannot be delivered vaginally.  Checking your cervix to determine: ? Whether it is thinning out (effacing). ? Whether it is opening up (dilating). ? How low your baby has moved into your birth canal.  What are the three stages of labor and delivery?  Normal labor and delivery is divided into the following three stages: Stage 1  Stage 1 is the  longest stage of labor, and it can last for hours or days. Stage 1 includes: ? Early labor. This is when contractions may be irregular, or regular and mild. Generally, early labor contractions are more than 10 minutes apart. ? Active labor. This is when contractions get longer, more regular, more frequent, and more intense. ? The transition phase. This is when contractions happen very close together, are very intense, and may last longer than during any other part of labor.  Contractions generally feel mild, infrequent, and irregular at first. They get stronger, more frequent (about every 2-3 minutes), and more regular as you progress from early labor through active labor and transition.  Many women progress through stage 1 naturally, but you may need help to continue making progress. If this happens, your health care provider may talk with you about: ? Rupturing your amniotic sac if it has not ruptured yet. ? Giving you medicine to help make your contractions stronger and more frequent.  Stage 1 ends when your cervix is completely dilated to 4 inches (10 cm) and completely effaced. This happens at the end of the transition phase. Stage 2  Once   your cervix is completely effaced and dilated to 4 inches (10 cm), you may start to feel an urge to push. It is common for the body to naturally take a rest before feeling the urge to push, especially if you received an epidural or certain other pain medicines. This rest period may last for up to 1-2 hours, depending on your unique labor experience.  During stage 2, contractions are generally less painful, because pushing helps relieve contraction pain. Instead of contraction pain, you may feel stretching and burning pain, especially when the widest part of your baby's head passes through the vaginal opening (crowning).  Your health care provider will closely monitor your pushing progress and your baby's progress through the vagina during stage 2.  Your  health care provider may massage the area of skin between your vaginal opening and anus (perineum) or apply warm compresses to your perineum. This helps it stretch as the baby's head starts to crown, which can help prevent perineal tearing. ? In some cases, an incision may be made in your perineum (episiotomy) to allow the baby to pass through the vaginal opening. An episiotomy helps to make the opening of the vagina larger to allow more room for the baby to fit through.  It is very important to breathe and focus so your health care provider can control the delivery of your baby's head. Your health care provider may have you decrease the intensity of your pushing, to help prevent perineal tearing.  After delivery of your baby's head, the shoulders and the rest of the body generally deliver very quickly and without difficulty.  Once your baby is delivered, the umbilical cord may be cut right away, or this may be delayed for 1-2 minutes, depending on your baby's health. This may vary among health care providers, hospitals, and birth centers.  If you and your baby are healthy enough, your baby may be placed on your chest or abdomen to help maintain the baby's temperature and to help you bond with each other. Some mothers and babies start breastfeeding at this time. Your health care team will dry your baby and help keep your baby warm during this time.  Your baby may need immediate care if he or she: ? Showed signs of distress during labor. ? Has a medical condition. ? Was born too early (prematurely). ? Had a bowel movement before birth (meconium). ? Shows signs of difficulty transitioning from being inside the uterus to being outside of the uterus. If you are planning to breastfeed, your health care team will help you begin a feeding. Stage 3  The third stage of labor starts immediately after the birth of your baby and ends after you deliver the placenta. The placenta is an organ that develops  during pregnancy to provide oxygen and nutrients to your baby in the womb.  Delivering the placenta may require some pushing, and you may have mild contractions. Breastfeeding can stimulate contractions to help you deliver the placenta.  After the placenta is delivered, your uterus should tighten (contract) and become firm. This helps to stop bleeding in your uterus. To help your uterus contract and to control bleeding, your health care provider may: ? Give you medicine by injection, through an IV tube, by mouth, or through your rectum (rectally). ? Massage your abdomen or perform a vaginal exam to remove any blood clots that are left in your uterus. ? Empty your bladder by placing a thin, flexible tube (catheter) into your bladder. ? Encourage   you to breastfeed your baby. After labor is over, you and your baby will be monitored closely to ensure that you are both healthy until you are ready to go home. Your health care team will teach you how to care for yourself and your baby. This information is not intended to replace advice given to you by your health care provider. Make sure you discuss any questions you have with your health care provider. Document Released: 02/13/2008 Document Revised: 11/24/2015 Document Reviewed: 05/21/2015 Elsevier Interactive Patient Education  2018 [employer]  

## 2017-01-02 ENCOUNTER — Inpatient Hospital Stay (HOSPITAL_COMMUNITY): Payer: Medicaid Other | Admitting: Anesthesiology

## 2017-01-02 ENCOUNTER — Inpatient Hospital Stay (HOSPITAL_COMMUNITY)
Admission: AD | Admit: 2017-01-02 | Discharge: 2017-01-04 | DRG: 775 | Disposition: A | Discharge status: Home / Self Care | Payer: Medicaid Other | Source: Ambulatory Visit | Attending: Family Medicine | Admitting: Family Medicine

## 2017-01-02 ENCOUNTER — Encounter: Payer: Medicaid Other | Admitting: Obstetrics and Gynecology

## 2017-01-02 ENCOUNTER — Encounter (HOSPITAL_COMMUNITY): Payer: Self-pay

## 2017-01-02 DIAGNOSIS — Z3490 Encounter for supervision of normal pregnancy, unspecified, unspecified trimester: Secondary | ICD-10-CM

## 2017-01-02 DIAGNOSIS — O09299 Supervision of pregnancy with other poor reproductive or obstetric history, unspecified trimester: Secondary | ICD-10-CM

## 2017-01-02 DIAGNOSIS — Z7982 Long term (current) use of aspirin: Secondary | ICD-10-CM

## 2017-01-02 DIAGNOSIS — Z3A39 39 weeks gestation of pregnancy: Secondary | ICD-10-CM

## 2017-01-02 DIAGNOSIS — Z3493 Encounter for supervision of normal pregnancy, unspecified, third trimester: Secondary | ICD-10-CM | POA: Diagnosis present

## 2017-01-02 DIAGNOSIS — O99824 Streptococcus B carrier state complicating childbirth: Principal | ICD-10-CM | POA: Diagnosis present

## 2017-01-02 DIAGNOSIS — O99820 Streptococcus B carrier state complicating pregnancy: Secondary | ICD-10-CM

## 2017-01-02 DIAGNOSIS — Z7722 Contact with and (suspected) exposure to environmental tobacco smoke (acute) (chronic): Secondary | ICD-10-CM | POA: Diagnosis present

## 2017-01-02 DIAGNOSIS — Z8619 Personal history of other infectious and parasitic diseases: Secondary | ICD-10-CM

## 2017-01-02 DIAGNOSIS — O09899 Supervision of other high risk pregnancies, unspecified trimester: Secondary | ICD-10-CM

## 2017-01-02 LAB — CBC
HCT: 31.7 % — ABNORMAL LOW (ref 36.0–46.0)
Hemoglobin: 10.1 g/dL — ABNORMAL LOW (ref 12.0–15.0)
MCH: 22.2 pg — ABNORMAL LOW (ref 26.0–34.0)
MCHC: 31.9 g/dL (ref 30.0–36.0)
MCV: 69.7 fL — ABNORMAL LOW (ref 78.0–100.0)
PLATELETS: 124 10*3/uL — AB (ref 150–400)
RBC: 4.55 MIL/uL (ref 3.87–5.11)
RDW: 15.5 % (ref 11.5–15.5)
WBC: 8.7 10*3/uL (ref 4.0–10.5)

## 2017-01-02 LAB — TYPE AND SCREEN
ABO/RH(D): O POS
Antibody Screen: NEGATIVE

## 2017-01-02 LAB — RPR: RPR: NONREACTIVE

## 2017-01-02 MED ORDER — LIDOCAINE HCL (PF) 1 % IJ SOLN
30.0000 mL | INTRAMUSCULAR | Status: DC | PRN
  Filled 2017-01-02: qty 30

## 2017-01-02 MED ORDER — LACTATED RINGERS IV SOLN
500.0000 mL | Freq: Once | INTRAVENOUS | Status: DC
Start: 2017-01-02 — End: 2017-01-03

## 2017-01-02 MED ORDER — OXYTOCIN 40 UNITS IN LACTATED RINGERS INFUSION - SIMPLE MED
2.5000 [IU]/h | INTRAVENOUS | Status: DC
  Administered 2017-01-02: 2.5 [IU]/h via INTRAVENOUS
  Filled 2017-01-02: qty 1000

## 2017-01-02 MED ORDER — DIPHENHYDRAMINE HCL 50 MG/ML IJ SOLN: 12.5000 mg | INTRAMUSCULAR | Status: DC | PRN

## 2017-01-02 MED ORDER — PENICILLIN G POT IN DEXTROSE 60000 UNIT/ML IV SOLN
3.0000 10*6.[IU] | INTRAVENOUS | Status: DC
  Administered 2017-01-02 (×3): 3 10*6.[IU] via INTRAVENOUS
  Filled 2017-01-02 (×8): qty 50

## 2017-01-02 MED ORDER — OXYCODONE-ACETAMINOPHEN 5-325 MG PO TABS: 1.0000 | ORAL_TABLET | ORAL | Status: DC | PRN

## 2017-01-02 MED ORDER — TERBUTALINE SULFATE 1 MG/ML IJ SOLN
0.2500 mg | Freq: Once | INTRAMUSCULAR | Status: DC | PRN
  Filled 2017-01-02: qty 1

## 2017-01-02 MED ORDER — OXYTOCIN BOLUS FROM INFUSION
500.0000 mL | Freq: Once | INTRAVENOUS | Status: AC
  Administered 2017-01-02: 500 mL via INTRAVENOUS

## 2017-01-02 MED ORDER — PHENYLEPHRINE 40 MCG/ML (10ML) SYRINGE FOR IV PUSH (FOR BLOOD PRESSURE SUPPORT)
PREFILLED_SYRINGE | INTRAVENOUS | Status: AC
  Administered 2017-01-02: 80 ug via INTRAVENOUS
  Filled 2017-01-02: qty 20

## 2017-01-02 MED ORDER — SOD CITRATE-CITRIC ACID 500-334 MG/5ML PO SOLN: 30.0000 mL | ORAL | Status: DC | PRN

## 2017-01-02 MED ORDER — FENTANYL 2.5 MCG/ML BUPIVACAINE 1/10 % EPIDURAL INFUSION (WH - ANES)
INTRAMUSCULAR | Status: AC
  Filled 2017-01-02: qty 100

## 2017-01-02 MED ORDER — PHENYLEPHRINE 40 MCG/ML (10ML) SYRINGE FOR IV PUSH (FOR BLOOD PRESSURE SUPPORT)
80.0000 ug | PREFILLED_SYRINGE | INTRAVENOUS | Status: DC | PRN
  Filled 2017-01-02: qty 5

## 2017-01-02 MED ORDER — OXYCODONE-ACETAMINOPHEN 5-325 MG PO TABS: 2.0000 | ORAL_TABLET | ORAL | Status: DC | PRN

## 2017-01-02 MED ORDER — PENICILLIN G POTASSIUM 5000000 UNITS IJ SOLR
5.0000 10*6.[IU] | Freq: Once | INTRAVENOUS | Status: AC
  Administered 2017-01-02: 5 10*6.[IU] via INTRAVENOUS
  Filled 2017-01-02: qty 5

## 2017-01-02 MED ORDER — FENTANYL 2.5 MCG/ML BUPIVACAINE 1/10 % EPIDURAL INFUSION (WH - ANES)
14.0000 mL/h | INTRAMUSCULAR | Status: DC | PRN
  Administered 2017-01-02 (×2): 14 mL/h via EPIDURAL
  Filled 2017-01-02: qty 100

## 2017-01-02 MED ORDER — ONDANSETRON HCL 4 MG/2ML IJ SOLN
INTRAMUSCULAR | Status: AC
  Administered 2017-01-02: 4 mg via INTRAVENOUS
  Filled 2017-01-02: qty 2

## 2017-01-02 MED ORDER — LACTATED RINGERS IV SOLN
500.0000 mL | INTRAVENOUS | Status: DC | PRN
  Administered 2017-01-02: 500 mL via INTRAVENOUS

## 2017-01-02 MED ORDER — IBUPROFEN 600 MG PO TABS
600.0000 mg | ORAL_TABLET | Freq: Four times a day (QID) | ORAL | Status: DC
  Administered 2017-01-02 – 2017-01-04 (×6): 600 mg via ORAL
  Filled 2017-01-02 (×6): qty 1

## 2017-01-02 MED ORDER — MINERAL OIL PO OIL: TOPICAL_OIL | Freq: Once | ORAL | Status: DC

## 2017-01-02 MED ORDER — EPHEDRINE 5 MG/ML INJ
10.0000 mg | INTRAVENOUS | Status: DC | PRN
  Filled 2017-01-02: qty 2

## 2017-01-02 MED ORDER — LACTATED RINGERS IV SOLN
INTRAVENOUS | Status: DC
  Administered 2017-01-02 (×3): via INTRAVENOUS

## 2017-01-02 MED ORDER — OXYTOCIN 40 UNITS IN LACTATED RINGERS INFUSION - SIMPLE MED
1.0000 m[IU]/min | INTRAVENOUS | Status: DC
Start: 2017-01-02 — End: 2017-01-03
  Administered 2017-01-02: 4 m[IU]/min via INTRAVENOUS
  Administered 2017-01-02: 2 m[IU]/min via INTRAVENOUS

## 2017-01-02 MED ORDER — PHENYLEPHRINE 40 MCG/ML (10ML) SYRINGE FOR IV PUSH (FOR BLOOD PRESSURE SUPPORT)
80.0000 ug | PREFILLED_SYRINGE | INTRAVENOUS | Status: DC | PRN
  Administered 2017-01-02: 80 ug via INTRAVENOUS
  Filled 2017-01-02: qty 5

## 2017-01-02 MED ORDER — ACETAMINOPHEN 325 MG PO TABS: 650.0000 mg | ORAL_TABLET | ORAL | Status: DC | PRN

## 2017-01-02 MED ORDER — LIDOCAINE HCL (PF) 1 % IJ SOLN
INTRAMUSCULAR | Status: DC | PRN
  Administered 2017-01-02: 13 mL via EPIDURAL

## 2017-01-02 MED ORDER — FENTANYL CITRATE (PF) 100 MCG/2ML IJ SOLN: 100.0000 ug | INTRAMUSCULAR | Status: DC | PRN

## 2017-01-02 MED ORDER — ONDANSETRON HCL 4 MG/2ML IJ SOLN
4.0000 mg | Freq: Four times a day (QID) | INTRAMUSCULAR | Status: DC | PRN
  Administered 2017-01-02 (×3): 4 mg via INTRAVENOUS
  Filled 2017-01-02: qty 2

## 2017-01-02 NOTE — Progress Notes (Addendum)
Labor Progress Note  Debra Lucas is a 21 y.o. G2P1001 at 6728w6d  admitted for active labor  S: No complaints. Comfortable with epidural.   O:  BP 116/61   Pulse 95   Temp 98.4 F (36.9 C) (Oral)   Resp 18   Ht 5\' 5"  (1.651 m)   Wt 178 lb (80.7 kg)   LMP 03/29/2016   BMI 29.62 kg/m   No intake/output data recorded.  FHT:  FHR: 125 bpm, variability: minimal ,  accelerations:  Present,  decelerations:  Absent UC:   regular, every 2-4 minutes SVE:   Dilation: 8 Effacement (%): 90 (thick anterior cervix) Station: -2 Exam by:: Milus GlazierJennifer Hamilton, RN AROM: clear  Pitocin @ 18mu/min Labs: Lab Results  Component Value Date   WBC 8.7 01/02/2017   HGB 10.1 (L) 01/02/2017   HCT 31.7 (L) 01/02/2017   MCV 69.7 (L) 01/02/2017   PLT 124 (L) 01/02/2017    Assessment / Plan: 21 y.o. G2P1001 7628w6d in active labor Arrest in active phase of labor  Labor: Started pitocin for augmentation and AROM but no change in cervical exam. Will place IUPC to see if contractions are adequate. Will discuss with attending. Fetal Wellbeing:  Category II Pain Control:  Epidural Anticipated MOD:  NSVD  Expectant management  Caryl AdaJazma Zidan Helget, DO OB Fellow Faculty Practice, [facility] -  01/02/2017, 5:19 PM

## 2017-01-02 NOTE — Progress Notes (Signed)
Labor Progress Note  Debra Lucas is a 21 y.o. G2P1001 at 5959w6d  admitted for active labor  S: No complaints. Comfortable with epidural.   O:  BP (!) 107/59   Pulse 88   Temp 98.3 F (36.8 C) (Oral)   Resp 16   Ht 5\' 5"  (1.651 m)   Wt 178 lb (80.7 kg)   LMP 03/29/2016   BMI 29.62 kg/m   No intake/output data recorded.  FHT:  FHR: 125 bpm, variability: moderate,  accelerations:  Present,  decelerations:  Absent UC:   regular, every 5-8 minutes SVE:   Dilation: 8 Effacement (%): 90 Station: -2 Exam by:: Debra GlazierJennifer Hamilton, RN AROM: clear  Labs: Lab Results  Component Value Date   WBC 8.7 01/02/2017   HGB 10.1 (L) 01/02/2017   HCT 31.7 (L) 01/02/2017   MCV 69.7 (L) 01/02/2017   PLT 124 (L) 01/02/2017    Assessment / Plan: 21 y.o. G2P1001 4759w6d in active labor Spontaneous labor, progressing normally  Labor: Progressing normally, may need to augment with pitocin.  Fetal Wellbeing:  Category I Pain Control:  Epidural Anticipated MOD:  NSVD  Expectant management  Debra AdaJazma Ziare Cryder, DO OB Fellow Faculty Practice, [facility] [place] 01/02/2017, 1:07 PM

## 2017-01-02 NOTE — Anesthesia Preprocedure Evaluation (Signed)
Anesthesia Evaluation  Patient identified by MRN, date of birth, ID band Patient awake    Reviewed: Allergy & Precautions, NPO status , Patient's Chart, lab work & pertinent test results  Airway Mallampati: II  TM Distance: >3 FB Neck ROM: Full    Dental  (+) Teeth Intact, Dental Advisory Given   Pulmonary neg pulmonary ROS,    Pulmonary exam normal breath sounds clear to auscultation       Cardiovascular hypertension (PIH), Pt. on medications and Pt. on home beta blockers Normal cardiovascular exam Rhythm:Regular Rate:Normal     Neuro/Psych negative neurological ROS  negative psych ROS   GI/Hepatic negative GI ROS, Neg liver ROS,   Endo/Other  negative endocrine ROS  Renal/GU negative Renal ROS     Musculoskeletal negative musculoskeletal ROS (+)   Abdominal   Peds  Hematology Plt 127k    Anesthesia Other Findings Day of surgery medications reviewed with the patient.  Reproductive/Obstetrics (+) Pregnancy PIH                             Anesthesia Physical  Anesthesia Plan  ASA: II  Anesthesia Plan: Epidural   Post-op Pain Management:    Induction:   PONV Risk Score and Plan:   Airway Management Planned:   Additional Equipment:   Intra-op Plan:   Post-operative Plan:   Informed Consent: I have reviewed the patients History and Physical, chart, labs and discussed the procedure including the risks, benefits and alternatives for the proposed anesthesia with the patient or authorized representative who has indicated his/her understanding and acceptance.   Dental advisory given  Plan Discussed with:   Anesthesia Plan Comments: (Patient identified. Risks/Benefits/Options discussed with patient including but not limited to bleeding, infection, nerve damage, paralysis, failed block, incomplete pain control, headache, blood pressure changes, nausea, vomiting, reactions to  medication both or allergic, itching and postpartum back pain. Confirmed with bedside nurse the patient's most recent platelet count. Confirmed with patient that they are not currently taking any anticoagulation, have any bleeding history or any family history of bleeding disorders. Patient expressed understanding and wished to proceed. All questions were answered. )        Anesthesia Quick Evaluation

## 2017-01-02 NOTE — H&P (Signed)
LABOR AND DELIVERY ADMISSION HISTORY AND PHYSICAL NOTE  Debra OchoaRochelle L Lucas is a 21 y.o. female G2P1001 with IUP at 5311w6d presenting for SOL. Current pregnancy is uncomplicated. 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  She reports positive fetal movement. Ctx began at 0100. She states she lost mucus plug at 0430 and has had scant vaginal bleeding since. She denies leakage of fluid. She denies headaches, blurry vision, RUQ pain.  Prenatal History/Complications:  Past Medical History: Past Medical History:  Diagnosis Date  . Medical history non-contributory     Past Surgical History: Past Surgical History:  Procedure Laterality Date  . ADENOIDECTOMY    . TONSILLECTOMY      Obstetrical History: OB History    Gravida Para Term Preterm AB Living   2 1 1     1    SAB TAB Ectopic Multiple Live Births         0 1      Social History: Social History   Social History  . Marital status: Single    Spouse name: N/A  . Number of children: N/A  . Years of education: N/A   Social History Main Topics  . Smoking status: Passive Smoke Exposure - Never Smoker  . Smokeless tobacco: Never Used  . Alcohol use No  . Drug use: No  . Sexual activity: Yes    Birth control/ protection: None   Other Topics Concern  . Not on file   Social History Narrative  . No narrative on file    Family History: Family History  Problem Relation Age of Onset  . Hypertension Maternal Grandmother     Allergies: Allergies  Allergen Reactions  . Shellfish Allergy Swelling and Rash    Prescriptions Prior to Admission  Medication Sig Dispense Refill Last Dose  . aspirin EC 81 MG tablet Take 1 tablet (81 mg total) by mouth daily. Take after 12 weeks for prevention of preeclampsia later in pregnancy 300 tablet 0 Taking  . metoCLOPramide (REGLAN) 10 MG tablet Take 1 tablet (10 mg total) by mouth every 8 (eight) hours as needed for nausea or  vomiting. 30 tablet 0 Taking  . Prenat w/o A-FE-Methfol-FA-DHA (PRENATE DHA) 28-0.6-0.4-300 MG CAPS Take 1 capsule by mouth daily before breakfast. 30 capsule 11 Taking    Review of Systems   All systems reviewed and negative except as stated in HPI.  Blood pressure 119/79, pulse 92, temperature 98.3 F (36.8 C), temperature source Oral, resp. rate 18, last menstrual period 03/29/2016, unknown if currently breastfeeding. General appearance: alert, cooperative and no distress Extremities: No calf swelling or tenderness Presentation: cephalic Fetal monitoring: 120 BPM, mod variability, + accels, no decels Uterine activity: regular ctx Dilation: 5.5 Effacement (%): 80 Exam by:: Caprice Renshawebra Callaway, RN    Prenatal labs: ABO, Rh: O/Positive/-- (02/12 1459) Antibody: Negative (02/12 1459) Rubella: Immune RPR: Non Reactive (05/22 1101)  HBsAg: Negative (02/12 1459)  HIV: Non-reactive (01/04 0000)  GBS:  Positive  2 hr Glucola: 102, 92  Genetic screening: Negative Anatomy US: Normal  Prenatal Transfer Tool  Maternal Diabetes: No Genetic Screening: Normal Maternal Ultrasounds/Referrals: Normal Fetal Ultrasounds or other Referrals:  Referred to Materal Fetal Medicine  for cardiac evaluation with normal results Maternal Substance Abuse:  No Significant Maternal Medications:  Meds include: Other: Aspirin, Reglan Significant Maternal Lab Results: Lab values include: Group B Strep positive  No results found for this or any previous visit (from  the past 24 hour(s)).  Patient Active Problem List   Diagnosis Date Noted  . GBS (group B Streptococcus carrier), +RV culture, currently pregnant 12/14/2016  . History of group B Streptococcus (GBS) infection 07/16/2016  . Hx of preeclampsia, prior pregnancy, currently pregnant 07/16/2016  . Short interval between pregnancies affecting pregnancy, antepartum 07/16/2016  . Supervision of normal pregnancy, antepartum 07/01/2016    Assessment: Debra Lucas is a 21 y.o. G2P1001 at [redacted]w[redacted]d here for SOL.  #Labor: Expectant management #Pain: Epidural upon request #FWB: Cat 1 #ID: GBS positive, IV penicillin #MOF: Bottle #MOC: Depo  Dannette Barbara, Medical Student 01/02/2017, 6:01 AM   CNM attestation:  I have seen and examined this patient; I agree with above documentation in the medical student's note.   Debra Lucas is a 21 y.o. G2P1001 here for SOL, uneventful preg  PE: BP 124/73   Pulse 89   Temp 98.8 F (37.1 C) (Oral)   Resp 18   Ht 5\' 5"  (1.651 m)   Wt 80.7 kg (178 lb)   LMP 03/29/2016   BMI 29.62 kg/m   Resp: normal effort, no distress Abd: gravid  ROS, labs, PMH reviewed  Plan: Admit to [facility] Expectant management PCN for GBS ppx Plans epidural for labor Anticipate SVD  Cam Hai CNM 01/02/2017, 8:28 AM

## 2017-01-02 NOTE — Anesthesia Postprocedure Evaluation (Signed)
Anesthesia Post Note  Patient: Debra Ochoaochelle L Grieb  Procedure(s) Performed: * No procedures listed *     Patient location during evaluation: Mother Baby Anesthesia Type: Epidural Level of consciousness: awake and alert Pain management: pain level controlled Vital Signs Assessment: post-procedure vital signs reviewed and stable Respiratory status: spontaneous breathing, nonlabored ventilation and respiratory function stable Cardiovascular status: stable Postop Assessment: no headache, no backache and epidural receding Anesthetic complications: no    Last Vitals:  Vitals:   01/02/17 2118 01/02/17 2131  BP:  113/68  Pulse:  (!) 108  Resp:  18  Temp: 37.5 C     Last Pain:  Vitals:   01/02/17 2118  TempSrc: Oral  PainSc:    Pain Goal:                 Lexani Corona

## 2017-01-02 NOTE — Anesthesia Procedure Notes (Signed)
Epidural Patient location during procedure: OB Start time: 01/02/2017 8:32 AM End time: 01/02/2017 8:51 AM  Staffing Anesthesiologist: Anitra LauthMILLER, WARREN RAY Performed: anesthesiologist   Preanesthetic Checklist Completed: patient identified, site marked, surgical consent, pre-op evaluation, timeout performed, IV checked, risks and benefits discussed and monitors and equipment checked  Epidural Patient position: sitting Prep: DuraPrep Patient monitoring: heart rate, cardiac monitor, continuous pulse ox and blood pressure Approach: midline Location: L2-L3 Injection technique: LOR saline  Needle:  Needle type: Tuohy  Needle gauge: 17 G Needle length: 9 cm Needle insertion depth: 5.5 cm Catheter type: closed end flexible Catheter size: 20 Guage Catheter at skin depth: 9 cm Test dose: negative  Assessment Events: blood not aspirated, injection not painful, no injection resistance, negative IV test and no paresthesia  Additional Notes Reason for block:procedure for pain

## 2017-01-02 NOTE — MAU Note (Signed)
Pt presents to MAU for contractions that started at 0100. Pt states that she lost her mucous plug at 0430 and has had scant vaginal bleeding. +FM

## 2017-01-02 NOTE — Progress Notes (Signed)
Vitals:   01/02/17 1831 01/02/17 1931  BP: 114/72 108/60  Pulse: 98 91  Resp: 16 18  Temp: 98.8 F (37.1 C)    FHR Cat 1.  Labor pattern rather dysfunctional:  Irregular and inconsistent strength of ctx. Pitocin at 12 mu/min. Cx exam deferred   Inadequate labor Increase pitocin q 30 minutes until adequate (discussed w/RN who agrees).

## 2017-01-02 NOTE — Anesthesia Pain Management Evaluation Note (Signed)
  CRNA Pain Management Visit Note  Patient: Debra Ochoaochelle L Rockwood, 21 y.o., female  "Hello I am a member of the anesthesia team at [facility]. We have an anesthesia team available at all times to provide care throughout the hospital, including epidural management and anesthesia for C-section. I don't know your plan for the delivery whether it a natural birth, water birth, IV sedation, nitrous supplementation, doula or epidural, but we want to meet your pain goals."   1.Was your pain managed to your expectations on prior hospitalizations?   Yes   2.What is your expectation for pain management during this hospitalization?     Epidural  3.How can we help you reach that goal?   Record the patient's initial score and the patient's pain goal.   Pain: 7  Pain Goal: 9 [facility] wants you to be able to say your pain was always managed very well.  Laban EmperorMalinova,Eriberto Felch Hristova 01/02/2017

## 2017-01-03 MED ORDER — METHYLERGONOVINE MALEATE 0.2 MG/ML IJ SOLN: 0.2000 mg | INTRAMUSCULAR | Status: DC | PRN

## 2017-01-03 MED ORDER — FERROUS SULFATE 325 (65 FE) MG PO TABS
325.0000 mg | ORAL_TABLET | Freq: Two times a day (BID) | ORAL | Status: DC
Start: 2017-01-03 — End: 2017-01-04
  Administered 2017-01-03 (×2): 325 mg via ORAL
  Filled 2017-01-03 (×2): qty 1

## 2017-01-03 MED ORDER — OXYCODONE HCL 5 MG PO TABS
5.0000 mg | ORAL_TABLET | ORAL | Status: DC | PRN
  Administered 2017-01-04: 5 mg via ORAL
  Filled 2017-01-03: qty 1

## 2017-01-03 MED ORDER — OXYCODONE HCL 5 MG PO TABS: 10.0000 mg | ORAL_TABLET | ORAL | Status: DC | PRN

## 2017-01-03 MED ORDER — WITCH HAZEL-GLYCERIN EX PADS: 1.0000 "application " | MEDICATED_PAD | CUTANEOUS | Status: DC | PRN

## 2017-01-03 MED ORDER — DOCUSATE SODIUM 100 MG PO CAPS
100.0000 mg | ORAL_CAPSULE | Freq: Two times a day (BID) | ORAL | Status: DC
  Administered 2017-01-03 (×2): 100 mg via ORAL
  Filled 2017-01-03 (×2): qty 1

## 2017-01-03 MED ORDER — MEASLES, MUMPS & RUBELLA VAC ~~LOC~~ INJ
0.5000 mL | INJECTION | Freq: Once | SUBCUTANEOUS | Status: DC
  Filled 2017-01-03: qty 0.5

## 2017-01-03 MED ORDER — COCONUT OIL OIL: 1.0000 "application " | TOPICAL_OIL | Status: DC | PRN

## 2017-01-03 MED ORDER — BISACODYL 10 MG RE SUPP: 10.0000 mg | Freq: Every day | RECTAL | Status: DC | PRN

## 2017-01-03 MED ORDER — ACETAMINOPHEN 325 MG PO TABS
650.0000 mg | ORAL_TABLET | ORAL | Status: DC | PRN
  Administered 2017-01-03: 650 mg via ORAL
  Filled 2017-01-03: qty 2

## 2017-01-03 MED ORDER — ONDANSETRON HCL 4 MG PO TABS: 4.0000 mg | ORAL_TABLET | ORAL | Status: DC | PRN

## 2017-01-03 MED ORDER — ZOLPIDEM TARTRATE 5 MG PO TABS: 5.0000 mg | ORAL_TABLET | Freq: Every evening | ORAL | Status: DC | PRN

## 2017-01-03 MED ORDER — DIPHENHYDRAMINE HCL 25 MG PO CAPS
25.0000 mg | ORAL_CAPSULE | Freq: Four times a day (QID) | ORAL | Status: DC | PRN
Start: 2017-01-03 — End: 2017-01-04

## 2017-01-03 MED ORDER — PRENATAL MULTIVITAMIN CH
1.0000 | ORAL_TABLET | Freq: Every day | ORAL | Status: DC
  Administered 2017-01-03: 1 via ORAL
  Filled 2017-01-03: qty 1

## 2017-01-03 MED ORDER — MEDROXYPROGESTERONE ACETATE 150 MG/ML IM SUSP
150.0000 mg | Freq: Once | INTRAMUSCULAR | Status: AC
  Administered 2017-01-03: 150 mg via INTRAMUSCULAR
  Filled 2017-01-03: qty 1

## 2017-01-03 MED ORDER — BENZOCAINE-MENTHOL 20-0.5 % EX AERO: 1.0000 "application " | INHALATION_SPRAY | CUTANEOUS | Status: DC | PRN

## 2017-01-03 MED ORDER — ONDANSETRON HCL 4 MG/2ML IJ SOLN: 4.0000 mg | INTRAMUSCULAR | Status: DC | PRN

## 2017-01-03 MED ORDER — FLEET ENEMA 7-19 GM/118ML RE ENEM: 1.0000 | ENEMA | Freq: Every day | RECTAL | Status: DC | PRN

## 2017-01-03 MED ORDER — METHYLERGONOVINE MALEATE 0.2 MG PO TABS: 0.2000 mg | ORAL_TABLET | ORAL | Status: DC | PRN

## 2017-01-03 MED ORDER — TETANUS-DIPHTH-ACELL PERTUSSIS 5-2.5-18.5 LF-MCG/0.5 IM SUSP: 0.5000 mL | Freq: Once | INTRAMUSCULAR | Status: DC

## 2017-01-03 MED ORDER — SIMETHICONE 80 MG PO CHEW: 80.0000 mg | CHEWABLE_TABLET | ORAL | Status: DC | PRN

## 2017-01-03 MED ORDER — DIBUCAINE 1 % RE OINT: 1.0000 "application " | TOPICAL_OINTMENT | RECTAL | Status: DC | PRN

## 2017-01-03 MED ORDER — IBUPROFEN 600 MG PO TABS: 600.0000 mg | ORAL_TABLET | Freq: Four times a day (QID) | ORAL | 0 refills | Status: DC

## 2017-01-03 NOTE — Anesthesia Postprocedure Evaluation (Signed)
Anesthesia Post Note  Patient: Debra Lucas  Procedure(s) Performed: * No procedures listed *     Patient location during evaluation: Mother Baby Anesthesia Type: Epidural Level of consciousness: awake and alert and oriented Pain management: pain level controlled Vital Signs Assessment: post-procedure vital signs reviewed and stable Respiratory status: spontaneous breathing and nonlabored ventilation Cardiovascular status: stable Postop Assessment: no headache, patient able to bend at knees, no backache, no signs of nausea or vomiting, epidural receding and adequate PO intake Anesthetic complications: no    Last Vitals:  Vitals:   01/03/17 0050 01/03/17 0615  BP: 126/70 111/66  Pulse: 91 79  Resp: 16 16  Temp: 37.3 C 37 C    Last Pain:  Vitals:   01/03/17 0615  TempSrc: Oral  PainSc: 3    Pain Goal:                 [place] [employer]

## 2017-01-03 NOTE — Addendum Note (Signed)
Addendum  created 01/03/17 0800 by Elgie Congo, CRNA   Charge Capture section accepted, Sign clinical note

## 2017-01-03 NOTE — Discharge Instructions (Signed)

## 2017-01-03 NOTE — Progress Notes (Signed)
UR chart review completed.  

## 2017-01-03 NOTE — Progress Notes (Signed)
Post Partum Day #1 Subjective: no complaints, up ad lib, voiding and reports normal lochia, complains of cramping  Objective: Blood pressure 126/70, pulse 91, temperature 99.2 F (37.3 Debra), temperature source Oral, resp. rate 16, height 5\' 5"  (1.651 m), weight 80.7 kg (178 lb), last menstrual period 03/29/2016, unknown if currently breastfeeding.  Physical Exam:  General: alert Lochia: appropriate Uterine Fundus: firm and NT at U DVT Evaluation: No evidence of DVT seen on physical exam.   Recent Labs  01/02/17 0540  HGB 10.1*  HCT 31.7*    Assessment/Plan: Plan for discharge tomorrow and Contraception depo provera today, bottle   LOS: 1 day   Debra Lucas Debra Lucas 01/03/2017, 6:14 AM

## 2017-01-04 LAB — BIRTH TISSUE RECOVERY COLLECTION (PLACENTA DONATION)

## 2017-01-04 NOTE — Discharge Summary (Signed)
OB Discharge Summary     Patient Name: Debra Lucas DOB: 07-04-1995 MRN: [id number]  Date of admission: 01/02/2017 Delivering MD: Dannette Barbara   Date of discharge: 01/04/2017  Admitting diagnosis: 39 WEEKS CTX Intrauterine pregnancy: [redacted]w[redacted]d     Secondary diagnosis:  Active Problems:   Labor and delivery, indication for care  Additional problems: Active labor; GBS pos     Discharge diagnosis: Term Pregnancy Delivered                                                                                                Post partum procedures:none  Augmentation: AROM and Pitocin  Complications: None  Hospital course:  Onset of Labor With Vaginal Delivery     21 y.o. yo [id number] at 107w6d was admitted in Active Labor on 01/02/2017. Patient had an uncomplicated labor course as follows:  Membrane Rupture Time/Date: 12:44 PM ,01/02/2017   Intrapartum Procedures: Episiotomy: None [1]                                         Lacerations:  None [1]  Patient had a delivery of a Viable infant. 01/02/2017  Information for the patient's newborn:  Jamaira, Sherk Girl Jordynn [[id number]]  Delivery Method: Vaginal, Spontaneous Delivery (Filed from Delivery Summary)    Pateint had an uncomplicated postpartum course.  She is ambulating, tolerating a regular diet, passing flatus, and urinating well. Patient is discharged home in stable condition on 01/04/17.   Physical exam  Vitals:   01/03/17 0615 01/03/17 1106 01/03/17 1832 01/04/17 0532  BP: 111/66 112/69 112/73 105/68  Pulse: 79 82 86 69  Resp: 16 17 17 18   Temp: 98.6 F (37 C) 97.8 F (36.6 C) 98 F (36.7 C) 97.9 F (36.6 C)  TempSrc: Oral Oral Oral Oral  Weight:      Height:       General: alert and cooperative Lochia: appropriate Uterine Fundus: firm Incision: N/A DVT Evaluation: No evidence of DVT seen on physical exam. Labs: Lab Results  Component Value Date   WBC 8.7 01/02/2017   HGB 10.1 (L) 01/02/2017   HCT 31.7 (L)  01/02/2017   MCV 69.7 (L) 01/02/2017   PLT 124 (L) 01/02/2017   CMP Latest Ref Rng & Units 08/11/2016  Glucose 65 - 99 mg/dL 95  BUN 6 - 20 mg/dL 8  Creatinine 6.96 - 2.95 mg/dL 2.84  Sodium 132 - 440 mmol/L 137  Potassium 3.5 - 5.1 mmol/L 3.4(L)  Chloride 101 - 111 mmol/L 102  CO2 22 - 32 mmol/L 22  Calcium 8.9 - 10.3 mg/dL 9.1  Total Protein 6.5 - 8.1 g/dL 7.4  Total Bilirubin 0.3 - 1.2 mg/dL 1.1  Alkaline Phos 38 - 126 U/L 50  AST 15 - 41 U/L 21  ALT 14 - 54 U/L 9(L)    Discharge instruction: per After Visit Summary and "Baby and Me Booklet".  After visit meds:  Allergies as of 01/04/2017      Reactions  Shellfish Allergy Swelling, Rash      Medication List    STOP taking these medications   aspirin EC 81 MG tablet     TAKE these medications   ibuprofen 600 MG tablet Commonly known as:  ADVIL,MOTRIN Take 1 tablet (600 mg total) by mouth every 6 (six) hours.   PRENATE DHA 28-0.6-0.4-300 MG Caps Take 1 capsule by mouth daily before breakfast.       Diet: routine diet  Activity: Advance as tolerated. Pelvic rest for 6 weeks.   Outpatient follow up:4 weeks Follow up Appt:Future Appointments Date Time Provider Department Center  01/06/2017 8:00 AM Constant, Gigi Gin, MD [facility] None   Follow up Visit:No Follow-up on file.  Postpartum contraception: Depo Provera- rec'd prior to d/c  Newborn Data: Live born female  Birth Weight: 7 lb 8.1 oz (3405 g) APGAR: 7, 9  Baby Feeding: Bottle Disposition:home with mother   01/04/2017 Cam Hai, CNM  9:00 AM

## 2017-01-06 ENCOUNTER — Encounter: Payer: Medicaid Other | Admitting: Obstetrics and Gynecology

## 2017-02-03 ENCOUNTER — Ambulatory Visit: Payer: Medicaid Other | Admitting: Obstetrics and Gynecology

## 2017-02-18 ENCOUNTER — Encounter: Payer: Self-pay | Admitting: Obstetrics & Gynecology

## 2017-02-18 ENCOUNTER — Ambulatory Visit (INDEPENDENT_AMBULATORY_CARE_PROVIDER_SITE_OTHER): Payer: Medicaid Other | Admitting: Obstetrics & Gynecology

## 2017-02-18 DIAGNOSIS — Z3042 Encounter for surveillance of injectable contraceptive: Secondary | ICD-10-CM

## 2017-02-18 MED ORDER — MEDROXYPROGESTERONE ACETATE 150 MG/ML IM SUSP: 150.0000 mg | INTRAMUSCULAR | 0 refills | Status: DC

## 2017-02-18 NOTE — Progress Notes (Signed)
Post Partum Exam  Debra Lucas is a 21 y.o. G64P2002 female who presents for a postpartum visit. She is 6 weeks postpartum following a spontaneous vaginal delivery. I have fully reviewed the prenatal and intrapartum course. The delivery was at [redacted]w[redacted]d gestational weeks.  Anesthesia: epidural. Postpartum course has been good. Baby's course has been great. Baby is feeding by bottle - Similac Advance. Bleeding staining only. Bowel function is normal. Bladder function is normal. Patient is sexually active. Contraception method is Depo-Provera injections. Postpartum depression screening:  Edinburgh Total was 7  The following portions of the patient's history were reviewed and updated as appropriate: allergies, current medications, past family history, past medical history, past social history, past surgical history and problem list.  Review of Systems Pertinent items are noted in HPI.    Objective:  Blood pressure 124/81, pulse 79, height  (1.651 m), weight 160 lb (72.6 kg), last menstrual period 02/17/2017, not currently breastfeeding.  General:  alert, cooperative and no distress           Abdomen: soft, non-tender; bowel sounds normal; no masses,  no organomegaly   Vulva:  not evaluated  Vagina: not evaluated           Rectal Exam: Not performed.        Assessment:    normal postpartum exam. Pap smear not done at today's visit.   Plan:   1. Contraception: Depo-Provera injections 2. RTC 8 weeks for DMPA 3. Follow up in: 8 weeks or as needed.     Adam Phenix, MD

## 2017-02-18 NOTE — Patient Instructions (Signed)

## 2017-04-02 ENCOUNTER — Ambulatory Visit: Payer: Medicaid Other

## 2017-04-09 ENCOUNTER — Ambulatory Visit: Payer: Medicaid Other

## 2017-04-15 ENCOUNTER — Telehealth: Payer: Self-pay | Admitting: *Deleted

## 2017-04-15 NOTE — Telephone Encounter (Signed)
Pt returned call and stated she did not have funds available to pay for DEPO shot since her medicaid is no longer active.Marland Kitchen..Marland Kitchen

## 2017-04-15 NOTE — Telephone Encounter (Signed)
Attempted to call patient to reschedule and or find out plans, no voicemail option and phone rang a fast busy.Marland Kitchen..Marland Kitchen

## 2017-06-03 ENCOUNTER — Encounter (HOSPITAL_COMMUNITY): Payer: Self-pay | Admitting: *Deleted

## 2017-06-03 ENCOUNTER — Inpatient Hospital Stay (HOSPITAL_COMMUNITY)
Admission: AD | Admit: 2017-06-03 | Discharge: 2017-06-03 | Disposition: A | Discharge status: Home / Self Care | Payer: Medicaid Other | Source: Ambulatory Visit | Attending: Obstetrics and Gynecology | Admitting: Obstetrics and Gynecology

## 2017-06-03 DIAGNOSIS — N898 Other specified noninflammatory disorders of vagina: Secondary | ICD-10-CM

## 2017-06-03 DIAGNOSIS — Z7722 Contact with and (suspected) exposure to environmental tobacco smoke (acute) (chronic): Secondary | ICD-10-CM | POA: Insufficient documentation

## 2017-06-03 DIAGNOSIS — N760 Acute vaginitis: Secondary | ICD-10-CM | POA: Insufficient documentation

## 2017-06-03 DIAGNOSIS — B9689 Other specified bacterial agents as the cause of diseases classified elsewhere: Secondary | ICD-10-CM | POA: Insufficient documentation

## 2017-06-03 DIAGNOSIS — Z3202 Encounter for pregnancy test, result negative: Secondary | ICD-10-CM | POA: Insufficient documentation

## 2017-06-03 LAB — WET PREP, GENITAL
SPERM: NONE SEEN
TRICH WET PREP: NONE SEEN
Yeast Wet Prep HPF POC: NONE SEEN

## 2017-06-03 LAB — URINALYSIS, ROUTINE W REFLEX MICROSCOPIC
Bilirubin Urine: NEGATIVE
GLUCOSE, UA: NEGATIVE mg/dL
HGB URINE DIPSTICK: NEGATIVE
Ketones, ur: NEGATIVE mg/dL
NITRITE: NEGATIVE
Protein, ur: NEGATIVE mg/dL
SPECIFIC GRAVITY, URINE: 1.019 (ref 1.005–1.030)
pH: 5 (ref 5.0–8.0)

## 2017-06-03 LAB — POCT PREGNANCY, URINE: PREG TEST UR: NEGATIVE

## 2017-06-03 MED ORDER — METRONIDAZOLE 500 MG PO TABS: 500.0000 mg | ORAL_TABLET | Freq: Two times a day (BID) | ORAL | 0 refills | Status: DC

## 2017-06-03 NOTE — MAU Provider Note (Signed)
History     CSN: [id number]  Arrival date and time: 06/03/17 0931   First Provider Initiated Contact with Patient 06/03/17 1008      Chief Complaint  Patient presents with  . Vaginal Discharge   HPI Debra Lucas is a 22 y.o. 542P2002 non pregnant female who presents with vaginal discharge with an odor for 3 days. She reports frequent BV infections and feels like she has BV again. Denies any pain or vaginal bleeding.   OB History    Gravida Para Term Preterm AB Living   2 2 2     2    SAB TAB Ectopic Multiple Live Births         0 2      Past Medical History:  Diagnosis Date  . Medical history non-contributory     Past Surgical History:  Procedure Laterality Date  . ADENOIDECTOMY    . TONSILLECTOMY      Family History  Problem Relation Age of Onset  . Hypertension Maternal Grandmother     Social History   Tobacco Use  . Smoking status: Passive Smoke Exposure - Never Smoker  . Smokeless tobacco: Never Used  Substance Use Topics  . Alcohol use: No  . Drug use: No    Allergies:  Allergies  Allergen Reactions  . Shellfish Allergy Swelling and Rash    Medications Prior to Admission  Medication Sig Dispense Refill Last Dose  . ibuprofen (ADVIL,MOTRIN) 600 MG tablet Take 1 tablet (600 mg total) by mouth every 6 (six) hours. 30 tablet 0   . medroxyPROGESTERone (DEPO-PROVERA) 150 MG/ML injection Inject 1 mL (150 mg total) into the muscle every 3 (three) months. 1 mL 0   . Prenat w/o A-FE-Methfol-FA-DHA (PRENATE DHA) 28-0.6-0.4-300 MG CAPS Take 1 capsule by mouth daily before breakfast. 30 capsule 11 01/01/2017 at Unknown time    Review of Systems  Constitutional: Negative.  Negative for fatigue and fever.  HENT: Negative.   Respiratory: Negative.  Negative for shortness of breath.   Cardiovascular: Negative.  Negative for chest pain.  Gastrointestinal: Negative.  Negative for abdominal pain, constipation, diarrhea, nausea and vomiting.  Genitourinary:  Positive for vaginal discharge. Negative for dysuria.  Neurological: Negative.  Negative for dizziness and headaches.   Physical Exam   Blood pressure 118/73, pulse (!) 102, temperature 98.5 F (36.9 C), temperature source Oral, resp. rate 16, height 5\' 5"  (1.651 m), weight 180 lb (81.6 kg), last menstrual period 05/05/2017, SpO2 100 %, not currently breastfeeding.  Physical Exam  Nursing note and vitals reviewed. Constitutional: She is oriented to person, place, and time. She appears well-developed and well-nourished. No distress.  HENT:  Head: Normocephalic.  Eyes: Pupils are equal, round, and reactive to light.  Cardiovascular: Normal rate, regular rhythm and normal heart sounds.  Respiratory: Effort normal and breath sounds normal. No respiratory distress.  GI: Soft. Bowel sounds are normal. She exhibits no distension. There is no tenderness.  Genitourinary: Vaginal discharge found.  Neurological: She is alert and oriented to person, place, and time.  Skin: Skin is warm and dry.  Psychiatric: She has a normal mood and affect. Her behavior is normal. Judgment and thought content normal.    MAU Course  Procedures Results for orders placed or performed during the hospital encounter of 06/03/17 (from the past 24 hour(s))  Urinalysis, Routine w reflex microscopic     Status: Abnormal   Collection Time: 06/03/17  9:40 AM  Result Value Ref Range   Color,  Urine YELLOW YELLOW   APPearance HAZY (A) CLEAR   Specific Gravity, Urine 1.019 1.005 - 1.030   pH 5.0 5.0 - 8.0   Glucose, UA NEGATIVE NEGATIVE mg/dL   Hgb urine dipstick NEGATIVE NEGATIVE   Bilirubin Urine NEGATIVE NEGATIVE   Ketones, ur NEGATIVE NEGATIVE mg/dL   Protein, ur NEGATIVE NEGATIVE mg/dL   Nitrite NEGATIVE NEGATIVE   Leukocytes, UA SMALL (A) NEGATIVE   RBC / HPF 0-5 0 - 5 RBC/hpf   WBC, UA 0-5 0 - 5 WBC/hpf   Bacteria, UA RARE (A) NONE SEEN   Squamous Epithelial / LPF 6-30 (A) NONE SEEN   Mucus PRESENT    Pregnancy, urine POC     Status: None   Collection Time: 06/03/17  9:58 AM  Result Value Ref Range   Preg Test, Ur NEGATIVE NEGATIVE   MDM UA, UPT Wet prep and gc/chlamydia  Assessment and Plan   1. Bacterial vaginosis   2. Vaginal discharge    -Discharge home in stable condition -Rx for metronidazole sent to patient's pharmacy -Patient advised to follow-up with health department for routine gyn care -Patient may return to MAU as needed or if her condition were to change or worsen  Rolm Bookbinder CNM 06/03/2017, 10:19 AM

## 2017-06-03 NOTE — Discharge Instructions (Signed)
In late 2019, the [facility] will be moving to the [facility] campus. At that time, the MAU (Maternity Admissions Unit), where you are being seen today, will no longer take care of non-pregnant patients. We strongly encourage you to find a doctor's office before that time, so that you can be seen with any GYN concerns, like vaginal discharge, urinary tract infection, etc.. in a timely manner.  In order to make an office visit more convenient, the Center for [facility] will be offering evening hours with same-day appointments, walk-in appointments and scheduled appointments available during this time.  Center for [facility] Hours: Monday - 8am - 7:30 pm with walk-in between 4pm- 7:30 pm Tuesday - 8 am - 5 pm (starting 08/19/17 we will be open late and accepting walk-ins from 4pm - 7:30pm) Wednesday - 8 am - 5 pm (starting 11/19/17 we will be open late and accepting walk-ins from 4pm - 7:30pm) Thursday 8 am - 5 pm (starting 02/19/18 we will be open late and accepting walk-ins from 4pm - 7:30pm) Friday 8 am - 5 pm  For an appointment please call the Center for [facility] at [phone]  For urgent needs, [facility] is also available for management of urgent GYN complaints such as vaginal discharge or urinary tract infections.     [facility] [employer] for [employer] at [facility]       Phone: [phone]  Center for [employer] at [employer] Phone: [phone]  Center for [employer] at [place]  Phone: [phone]  Center for [employer] at [employer]  Phone: [phone]  Center for [facility] [place]  Phone: [phone]  [facility]       Phone: [phone]  [facility]    Phone: [phone]   [facility]    Phone: [phone]  [facility]    Phone: [phone]  [facility]    Phone: [phone]  [facility]    Phone: [phone]  [facility]       Phone: [phone]   [facility]  Phone: [phone]  [facility]    Phone: [phone]  [facility]   Phone: [phone]  [facility]      Phone: [phone]  [facility]    Phone: [phone]    Bacterial Vaginosis Bacterial vaginosis is an infection of the vagina. It happens when too many germs (bacteria) grow in the vagina. This infection puts you at risk for infections from sex (STIs). Treating this infection can lower your risk for some STIs. You should also treat this if you are pregnant. It can cause your baby to be born early. Follow these instructions at home: Medicines  Take over-the-counter and prescription medicines only as told by your doctor.  Take or use your antibiotic medicine as told by your doctor. Do not stop taking or using it even if you start to feel better. General instructions  If you your sexual partner is a woman, tell her that you have this infection. She needs to get treatment if she has symptoms. If you have a female partner, he does not need to be treated.  During treatment: ? Avoid sex. ? Do not douche. ? Avoid alcohol as told. ? Avoid breastfeeding as told.  Drink enough fluid  to keep your pee (urine) clear or pale yellow.  Keep your vagina and butt (rectum) clean. ? Wash the area with warm water every day. ? Wipe from front to back after you use the toilet.  Keep all follow-up visits as told by your doctor. This is important. Preventing this condition  Do not douche.  Use only warm water to wash around your vagina.  Use protection when you have sex. This includes: ? Latex condoms. ? Dental dams.  Limit how  many people you have sex with. It is best to only have sex with the same person (be monogamous).  Get tested for STIs. Have your partner get tested.  Wear underwear that is cotton or lined with cotton.  Avoid tight pants and pantyhose. This is most important in summer.  Do not use any products that have nicotine or tobacco in them. These include cigarettes and e-cigarettes. If you need help quitting, ask your doctor.  Do not use illegal drugs.  Limit how much alcohol you drink. Contact a doctor if:  Your symptoms do not get better, even after you are treated.  You have more discharge or pain when you pee (urinate).  You have a fever.  You have pain in your belly (abdomen).  You have pain with sex.  Your bleed from your vagina between periods. Summary  This infection happens when too many germs (bacteria) grow in the vagina.  Treating this condition can lower your risk for some infections from sex (STIs).  You should also treat this if you are pregnant. It can cause early (premature) birth.  Do not stop taking or using your antibiotic medicine even if you start to feel better. This information is not intended to replace advice given to you by your health care provider. Make sure you discuss any questions you have with your health care provider. Document Released: 02/13/2008 Document Revised: 01/20/2016 Document Reviewed: 01/20/2016 Elsevier Interactive Patient Education  2017 [employer]

## 2017-06-03 NOTE — MAU Note (Signed)
Pt reports vaginal discharge with an odor for 3 days.

## 2017-06-04 LAB — GC/CHLAMYDIA PROBE AMP (~~LOC~~) NOT AT ARMC
Chlamydia: NEGATIVE
Neisseria Gonorrhea: NEGATIVE

## 2017-07-27 ENCOUNTER — Emergency Department (HOSPITAL_COMMUNITY)
Admission: EM | Admit: 2017-07-27 | Discharge: 2017-07-27 | Disposition: A | Discharge status: Home / Self Care | Payer: Self-pay | Attending: Emergency Medicine | Admitting: Emergency Medicine

## 2017-07-27 ENCOUNTER — Encounter (HOSPITAL_COMMUNITY): Payer: Self-pay | Admitting: Emergency Medicine

## 2017-07-27 DIAGNOSIS — Z5321 Procedure and treatment not carried out due to patient leaving prior to being seen by health care provider: Secondary | ICD-10-CM | POA: Insufficient documentation

## 2017-07-27 DIAGNOSIS — R197 Diarrhea, unspecified: Secondary | ICD-10-CM | POA: Insufficient documentation

## 2017-07-27 LAB — URINALYSIS, ROUTINE W REFLEX MICROSCOPIC
Bilirubin Urine: NEGATIVE
GLUCOSE, UA: NEGATIVE mg/dL
Hgb urine dipstick: NEGATIVE
Ketones, ur: 80 mg/dL — AB
Nitrite: NEGATIVE
PH: 5 (ref 5.0–8.0)
Protein, ur: 30 mg/dL — AB
SPECIFIC GRAVITY, URINE: 1.032 — AB (ref 1.005–1.030)

## 2017-07-27 LAB — COMPREHENSIVE METABOLIC PANEL
ALK PHOS: 63 U/L (ref 38–126)
ALT: 15 U/L (ref 14–54)
AST: 22 U/L (ref 15–41)
Albumin: 3.9 g/dL (ref 3.5–5.0)
Anion gap: 12 (ref 5–15)
BUN: 11 mg/dL (ref 6–20)
CALCIUM: 9 mg/dL (ref 8.9–10.3)
CO2: 23 mmol/L (ref 22–32)
CREATININE: 0.81 mg/dL (ref 0.44–1.00)
Chloride: 102 mmol/L (ref 101–111)
GFR calc Af Amer: >60 mL/min (ref >60)
Glucose, Bld: 100 mg/dL — ABNORMAL HIGH (ref 65–99)
Potassium: 3.8 mmol/L (ref 3.5–5.1)
Sodium: 137 mmol/L (ref 135–145)
Total Bilirubin: 0.6 mg/dL (ref 0.3–1.2)
Total Protein: 7.7 g/dL (ref 6.5–8.1)

## 2017-07-27 LAB — LIPASE, BLOOD: Lipase: 21 U/L (ref 11–51)

## 2017-07-27 LAB — CBC
HCT: 42.5 % (ref 36.0–46.0)
Hemoglobin: 13.7 g/dL (ref 12.0–15.0)
MCH: 23.3 pg — AB (ref 26.0–34.0)
MCHC: 32.2 g/dL (ref 30.0–36.0)
MCV: 72.2 fL — ABNORMAL LOW (ref 78.0–100.0)
PLATELETS: 158 10*3/uL (ref 150–400)
RBC: 5.89 MIL/uL — ABNORMAL HIGH (ref 3.87–5.11)
RDW: 15.7 % — AB (ref 11.5–15.5)
WBC: 5.2 10*3/uL (ref 4.0–10.5)

## 2017-07-27 LAB — I-STAT BETA HCG BLOOD, ED (MC, WL, AP ONLY): I-stat hCG, quantitative: <5 m[IU]/mL (ref <5)

## 2017-07-27 LAB — I-STAT CG4 LACTIC ACID, ED: Lactic Acid, Venous: 0.86 mmol/L (ref 0.5–1.9)

## 2017-07-27 MED ORDER — ONDANSETRON 4 MG PO TBDP
4.0000 mg | ORAL_TABLET | Freq: Once | ORAL | Status: AC | PRN
  Administered 2017-07-27: 4 mg via ORAL
  Filled 2017-07-27: qty 1

## 2017-07-27 NOTE — ED Triage Notes (Signed)
Pt reports diarrhea and emesis since last night around 0200. Pt states her daughters are also sick. Pt states 6 episodes of emesis with 8 episodes of diarrhea. Pt states LLQ abdominal pain. LMP Feb 17th.

## 2017-07-27 NOTE — ED Notes (Signed)
Pt no longer here, not in restrooms. EDP made aware, pt LWBS.

## 2017-07-27 NOTE — ED Provider Notes (Cosign Needed)
Went to evaluate pt twice, not in hall bed. EMT and RN not aware of patient's wherabouts.    Liberty HandyGibbons, Illias Pantano J, PA-C 07/27/17 1900

## 2017-09-18 ENCOUNTER — Encounter (HOSPITAL_COMMUNITY): Payer: Self-pay | Admitting: *Deleted

## 2017-09-18 ENCOUNTER — Inpatient Hospital Stay (HOSPITAL_COMMUNITY)
Admission: AD | Admit: 2017-09-18 | Discharge: 2017-09-18 | Disposition: A | Discharge status: Home / Self Care | Payer: Self-pay | Source: Ambulatory Visit | Attending: Obstetrics & Gynecology | Admitting: Obstetrics & Gynecology

## 2017-09-18 DIAGNOSIS — B9689 Other specified bacterial agents as the cause of diseases classified elsewhere: Secondary | ICD-10-CM | POA: Insufficient documentation

## 2017-09-18 DIAGNOSIS — N760 Acute vaginitis: Secondary | ICD-10-CM | POA: Insufficient documentation

## 2017-09-18 LAB — WET PREP, GENITAL
Sperm: NONE SEEN
Trich, Wet Prep: NONE SEEN
Yeast Wet Prep HPF POC: NONE SEEN

## 2017-09-18 LAB — URINALYSIS, ROUTINE W REFLEX MICROSCOPIC
GLUCOSE, UA: NEGATIVE mg/dL
HGB URINE DIPSTICK: NEGATIVE
KETONES UR: NEGATIVE mg/dL
NITRITE: NEGATIVE
PROTEIN: NEGATIVE mg/dL
Specific Gravity, Urine: 1.028 (ref 1.005–1.030)
pH: 5 (ref 5.0–8.0)

## 2017-09-18 LAB — POCT PREGNANCY, URINE: Preg Test, Ur: NEGATIVE

## 2017-09-18 MED ORDER — METRONIDAZOLE 500 MG PO TABS: 500.0000 mg | ORAL_TABLET | Freq: Two times a day (BID) | ORAL | 0 refills | Status: DC

## 2017-09-18 NOTE — MAU Note (Signed)
Pt reports yellow vaginal discharge with odor and vaginal itching for 2-3 weeks. Pt reports a history of bacterial infections and thinks that is what it is.

## 2017-09-18 NOTE — MAU Provider Note (Signed)
History     CSN: [id number]  Arrival date and time: 09/18/17 0816  Chief Complaint  Patient presents with  . Vaginal Discharge   22 y.o. Non-pregnant female here with vaginal discharge. Sx started 2-3 weeks ago. Describes as cloudy, yellow, malodorous and itchy. Denies new detergents or skin products. No new partner. Had BV in the past 4 times. Using condoms for birth control, desires something more effective but doesn't have insurance.  Past Medical History:  Diagnosis Date  . Medical history non-contributory     Past Surgical History:  Procedure Laterality Date  . ADENOIDECTOMY    . TONSILLECTOMY      Family History  Problem Relation Age of Onset  . Hypertension Maternal Grandmother     Social History   Tobacco Use  . Smoking status: Passive Smoke Exposure - Never Smoker  . Smokeless tobacco: Never Used  Substance Use Topics  . Alcohol use: No  . Drug use: No    Allergies:  Allergies  Allergen Reactions  . Shellfish Allergy Swelling and Rash    Medications Prior to Admission  Medication Sig Dispense Refill Last Dose  . ibuprofen (ADVIL,MOTRIN) 600 MG tablet Take 1 tablet (600 mg total) by mouth every 6 (six) hours. (Patient not taking: Reported on 07/27/2017) 30 tablet 0 Not Taking at Unknown time  . Prenat w/o A-FE-Methfol-FA-DHA (PRENATE DHA) 28-0.6-0.4-300 MG CAPS Take 1 capsule by mouth daily before breakfast. (Patient not taking: Reported on 07/27/2017) 30 capsule 11 Not Taking at Unknown time  . [DISCONTINUED] metroNIDAZOLE (FLAGYL) 500 MG tablet Take 1 tablet (500 mg total) by mouth 2 (two) times daily. (Patient not taking: Reported on 07/27/2017) 14 tablet 0 Not Taking at Unknown time    Review of Systems  Constitutional: Negative.   Gastrointestinal: Negative.   Genitourinary: Positive for vaginal discharge.   Physical Exam   Blood pressure 114/71, pulse 91, temperature 98.2 F (36.8 C), temperature source Oral, resp. rate 15, height  (1.651 m),  weight 180 lb (81.6 kg), last menstrual period 09/07/2017, SpO2 100 %, not currently breastfeeding.  Physical Exam  Constitutional: She is oriented to person, place, and time. She appears well-developed and well-nourished. No distress.  HENT:  Head: Normocephalic and atraumatic.  Neck: Normal range of motion.  Respiratory: Effort normal. No respiratory distress.  Musculoskeletal: Normal range of motion.  Neurological: She is alert and oriented to person, place, and time.  Skin: Skin is warm and dry.  Psychiatric: She has a normal mood and affect.   Results for orders placed or performed during the hospital encounter of 09/18/17 (from the past 24 hour(s))  Urinalysis, Routine w reflex microscopic     Status: Abnormal   Collection Time: 09/18/17  8:20 AM  Result Value Ref Range   Color, Urine AMBER (A) YELLOW   APPearance HAZY (A) CLEAR   Specific Gravity, Urine 1.028 1.005 - 1.030   pH 5.0 5.0 - 8.0   Glucose, UA NEGATIVE NEGATIVE mg/dL   Hgb urine dipstick NEGATIVE NEGATIVE   Bilirubin Urine SMALL (A) NEGATIVE   Ketones, ur NEGATIVE NEGATIVE mg/dL   Protein, ur NEGATIVE NEGATIVE mg/dL   Nitrite NEGATIVE NEGATIVE   Leukocytes, UA TRACE (A) NEGATIVE   RBC / HPF 0-5 0 - 5 RBC/hpf   WBC, UA 0-5 0 - 5 WBC/hpf   Bacteria, UA RARE (A) NONE SEEN   Squamous Epithelial / LPF 11-20 0 - 5   Mucus PRESENT   Wet prep, genital  Status: Abnormal   Collection Time: 09/18/17  8:36 AM  Result Value Ref Range   Yeast Wet Prep HPF POC NONE SEEN NONE SEEN   Trich, Wet Prep NONE SEEN NONE SEEN   Clue Cells Wet Prep HPF POC PRESENT (A) NONE SEEN   WBC, Wet Prep HPF POC MODERATE (A) NONE SEEN   Sperm NONE SEEN   Pregnancy, urine POC     Status: None   Collection Time: 09/18/17  8:36 AM  Result Value Ref Range   Preg Test, Ur NEGATIVE NEGATIVE   MAU Course  Procedures  MDM Labs ordered and reviewed. Sx and wet prep consistent with BV. Discussed adding probiotics to daily regimen. Stable  for discharge home.   Assessment and Plan   1. Bacterial vaginosis    Discharge home [facility]- could obtain contraception Follow up at [facility] walkin clinic as needed Rx Flagyl  Allergies as of 09/18/2017      Reactions   Shellfish Allergy Swelling, Rash      Medication List    STOP taking these medications   ibuprofen 600 MG tablet Commonly known as:  ADVIL,MOTRIN     TAKE these medications   metroNIDAZOLE 500 MG tablet Commonly known as:  FLAGYL Take 1 tablet (500 mg total) by mouth 2 (two) times daily.   PRENATE DHA 28-0.6-0.4-300 MG Caps Take 1 capsule by mouth daily before breakfast.      Donette Larry, CNM 09/18/2017, 9:20 AM

## 2017-09-19 LAB — GC/CHLAMYDIA PROBE AMP (~~LOC~~) NOT AT ARMC
CHLAMYDIA, DNA PROBE: NEGATIVE
Neisseria Gonorrhea: NEGATIVE

## 2017-10-27 ENCOUNTER — Encounter (HOSPITAL_COMMUNITY): Payer: Self-pay | Admitting: Emergency Medicine

## 2017-10-27 ENCOUNTER — Emergency Department (HOSPITAL_COMMUNITY)
Admission: EM | Admit: 2017-10-27 | Discharge: 2017-10-27 | Disposition: A | Discharge status: Home / Self Care | Payer: Self-pay | Attending: Emergency Medicine | Admitting: Emergency Medicine

## 2017-10-27 ENCOUNTER — Other Ambulatory Visit: Payer: Self-pay

## 2017-10-27 DIAGNOSIS — Z7722 Contact with and (suspected) exposure to environmental tobacco smoke (acute) (chronic): Secondary | ICD-10-CM | POA: Insufficient documentation

## 2017-10-27 DIAGNOSIS — Z79899 Other long term (current) drug therapy: Secondary | ICD-10-CM | POA: Insufficient documentation

## 2017-10-27 DIAGNOSIS — H5711 Ocular pain, right eye: Secondary | ICD-10-CM | POA: Insufficient documentation

## 2017-10-27 DIAGNOSIS — H6691 Otitis media, unspecified, right ear: Secondary | ICD-10-CM | POA: Insufficient documentation

## 2017-10-27 MED ORDER — AMOXICILLIN-POT CLAVULANATE 875-125 MG PO TABS: 1.0000 | ORAL_TABLET | Freq: Two times a day (BID) | ORAL | 0 refills | Status: DC

## 2017-10-27 NOTE — ED Notes (Signed)
Pt unable to sign for d/c paperwork because she is in the hallway. Reviewed d/c paperwork and escorted to lobby.

## 2017-10-27 NOTE — Discharge Instructions (Signed)
Take Amoxicillin with food twice a day for one week Take over the counter medicines for pain Return if worsening

## 2017-10-27 NOTE — ED Triage Notes (Signed)
Pt. Stated, Ive had ear pain since yesterday

## 2017-10-27 NOTE — ED Provider Notes (Signed)
[facility]   CSN: [id number] Arrival date & time: 10/27/17  0715    History   Chief Complaint Chief Complaint  Patient presents with  . Otalgia    HPI Debra Lucas is a 22 y.o. female who presents with R ear pain. No significant PMH. She states the pain started last night. It is sharp and stabbing. She has decreased hearing. She reports associated URI symptoms of fever, sore throat, and R eye redness which has been going on about a week. Her daughter was diagnosed with an ear infection last week as well. She could not sleep due to the pain so came to the ED this morning.  HPI  Past Medical History:  Diagnosis Date  . Medical history non-contributory     Patient Active Problem List   Diagnosis Date Noted  . History of group B Streptococcus (GBS) infection 07/16/2016    Past Surgical History:  Procedure Laterality Date  . ADENOIDECTOMY    . TONSILLECTOMY       OB History    Gravida  2   Para  2   Term  2   Preterm      AB      Living  2     SAB      TAB      Ectopic      Multiple  0   Live Births  2            Home Medications    Prior to Admission medications   Medication Sig Start Date End Date Taking? Authorizing Provider  amoxicillin-clavulanate (AUGMENTIN) 875-125 MG tablet Take 1 tablet by mouth every 12 (twelve) hours. 10/27/17   Bethel BornGekas, Kelly Marie, PA-C  metroNIDAZOLE (FLAGYL) 500 MG tablet Take 1 tablet (500 mg total) by mouth 2 (two) times daily. 09/18/17   Donette LarryBhambri, Melanie, CNM  Prenat w/o A-FE-Methfol-FA-DHA (PRENATE DHA) 28-0.6-0.4-300 MG CAPS Take 1 capsule by mouth daily before breakfast. Patient not taking: Reported on 07/27/2017 07/01/16   Brock BadHarper, Charles A, MD    Family History Family History  Problem Relation Age of Onset  . Hypertension Maternal Grandmother     Social History Social History   Tobacco Use  . Smoking status: Passive Smoke Exposure - Never Smoker   . Smokeless tobacco: Never Used  Substance Use Topics  . Alcohol use: No  . Drug use: No     Allergies   Shellfish allergy   Review of Systems Review of Systems  Constitutional: Positive for fever.  HENT: Positive for ear pain and sore throat. Negative for ear discharge, hearing loss and rhinorrhea.   Eyes: Positive for redness.     Physical Exam Updated Vital Signs BP 110/73 (BP Location: Right Arm)   Pulse 80   Temp 99 F (37.2 C) (Oral)   Resp 16   Ht 5\' 5"  (1.651 m)   Wt 81.6 kg (180 lb)   LMP 10/06/2017   SpO2 100%   BMI 29.95 kg/m   Physical Exam  Constitutional: She is oriented to person, place, and time. She appears well-developed and well-nourished. No distress.  HENT:  Head: Normocephalic and atraumatic.  Right Ear: Hearing, external ear and ear canal normal. There is tenderness. Tympanic membrane is injected.  Left Ear: Hearing, tympanic membrane, external ear and ear canal normal.  Nose: Nose normal.  Mouth/Throat: Uvula is midline. Oropharyngeal exudate and posterior oropharyngeal erythema present.  Eyes: Pupils are equal, round, and reactive  to light. Conjunctivae are normal. Right eye exhibits no discharge. Left eye exhibits no discharge. No scleral icterus.  Mild redness of the right eye  Neck: Normal range of motion.  Cardiovascular: Normal rate and regular rhythm.  Pulmonary/Chest: Effort normal and breath sounds normal. No respiratory distress.  Abdominal: She exhibits no distension.  Neurological: She is alert and oriented to person, place, and time.  Skin: Skin is warm and dry.  Psychiatric: She has a normal mood and affect. Her behavior is normal.  Nursing note and vitals reviewed.    ED Treatments / Results  Labs (all labs ordered are listed, but only abnormal results are displayed) Labs Reviewed - No data to display  EKG None  Radiology No results found.  Procedures Procedures (including critical care time)  Medications  Ordered in ED Medications - No data to display   Initial Impression / Assessment and Plan / ED Course  I have reviewed the triage vital signs and the nursing notes.  Pertinent labs & imaging results that were available during my care of the patient were reviewed by me and considered in my medical decision making (see chart for details).  22 year old with URI symptoms of conjunctivitis, sore throat, and R eye pain with evidence of otitis media on exam. Her vitals are normal. She is well appearing. Will rx Augmentin for one week. Supportive care discussed.  Final Clinical Impressions(s) / ED Diagnoses   Final diagnoses:  Right otitis media, unspecified otitis media type    ED Discharge Orders        Ordered    amoxicillin-clavulanate (AUGMENTIN) 875-125 MG tablet  Every 12 hours     10/27/17 0759       Bethel Born, PA-C 10/27/17 1610    Pricilla Loveless, MD 10/27/17 [phone]

## 2018-07-02 ENCOUNTER — Other Ambulatory Visit: Payer: Self-pay | Admitting: Certified Nurse Midwife

## 2018-07-04 ENCOUNTER — Other Ambulatory Visit: Payer: Self-pay | Admitting: Certified Nurse Midwife

## 2018-07-04 ENCOUNTER — Other Ambulatory Visit (HOSPITAL_COMMUNITY): Payer: Self-pay | Admitting: Certified Nurse Midwife

## 2018-07-05 ENCOUNTER — Inpatient Hospital Stay (HOSPITAL_COMMUNITY)
Admission: AD | Admit: 2018-07-05 | Discharge: 2018-07-05 | Disposition: A | Discharge status: Home / Self Care | Payer: Self-pay | Source: Ambulatory Visit | Attending: Obstetrics & Gynecology | Admitting: Obstetrics & Gynecology

## 2018-07-05 DIAGNOSIS — Z7722 Contact with and (suspected) exposure to environmental tobacco smoke (acute) (chronic): Secondary | ICD-10-CM | POA: Insufficient documentation

## 2018-07-05 DIAGNOSIS — B9689 Other specified bacterial agents as the cause of diseases classified elsewhere: Secondary | ICD-10-CM | POA: Diagnosis present

## 2018-07-05 DIAGNOSIS — N760 Acute vaginitis: Secondary | ICD-10-CM | POA: Insufficient documentation

## 2018-07-05 LAB — WET PREP, GENITAL
SPERM: NONE SEEN
TRICH WET PREP: NONE SEEN
Yeast Wet Prep HPF POC: NONE SEEN

## 2018-07-05 LAB — URINALYSIS, ROUTINE W REFLEX MICROSCOPIC
BILIRUBIN URINE: NEGATIVE
GLUCOSE, UA: NEGATIVE mg/dL
HGB URINE DIPSTICK: NEGATIVE
Ketones, ur: NEGATIVE mg/dL
Nitrite: NEGATIVE
Protein, ur: NEGATIVE mg/dL
SPECIFIC GRAVITY, URINE: 1.015 (ref 1.005–1.030)
pH: 6.5 (ref 5.0–8.0)

## 2018-07-05 LAB — URINALYSIS, MICROSCOPIC (REFLEX)

## 2018-07-05 MED ORDER — METRONIDAZOLE 500 MG PO TABS: 500.0000 mg | ORAL_TABLET | Freq: Two times a day (BID) | ORAL | 0 refills | Status: AC

## 2018-07-05 NOTE — MAU Note (Signed)
Debra Lucas is a 23 y.o. in MAU reporting: cloudy discharge that has a foul odor. Ongoing for 3 weeks. Having some cramping.  LMP: 06/26/18  Onset of complaint: 3 weeks  Pain score: 2/10  Vitals:   07/05/18 1552  BP: 122/70  Pulse: 85  Resp: 18  Temp: 98.3 F (36.8 C)  SpO2: 98%      Lab orders placed from triage: UA, CNM orders swabs for pt to collect in bathroom

## 2018-07-05 NOTE — Discharge Instructions (Signed)
Alternative Vaginitis Therapies  1) soak in tub of warm water waist high with 1/2 cup of baking soda in water for ~ 20 mins. 2) soak 3 tampons in 1 tablespoon of fractionated (liquid form) coconut oil with 10 drops of Melaleuca (Tea Tree) essential oil, insert 1 saturated tampon vaginally at bedtime x 3 days. Both options are to be done after sexual intercourse, menses and when suspects BV and/or yeast infection. Advised that these alternatives will not replace the need to be evaluated, if sx's persist. You will need to seek care at an OB/GYN provider.  GO WHITE: Soap: UNSCENTED Dove (white box light green writing) Laundry detergent (underwear)- Dreft or [employer] unscented WHITE 100% cotton panties (NOT just cotton crouch) Sanitary napkin/panty liners: UNSCENTED.  If it doesn't SAY unscented it can have a scent/perfume    NO PERFUMES OR LOTIONS OR POTIONS in the vulvar area (may use regular [place]) Condoms: hypoallergenic only. Non dyed (no color) Toilet papers: white only Wash clothes: use a separate wash cloth. WHITE.  Wash in [place].   You can purchase Tea Tree Oil locally at:  [employer] [address] [place], [place] [address] [phone]  [employer] [address] [place], [place] [address] [phone]    Bacterial Vaginosis  Bacterial vaginosis is a vaginal infection that occurs when the normal balance of bacteria in the vagina is disrupted. It results from an overgrowth of certain bacteria. This is the most common vaginal infection among women ages 25-44. Because bacterial vaginosis increases your risk for STIs (sexually transmitted infections), getting treated can help reduce your risk for chlamydia, gonorrhea, herpes, and HIV (human immunodeficiency virus). Treatment is also important for preventing complications in pregnant women, because this condition can cause an early (premature) delivery. What are the causes? This condition is caused by an  increase in harmful bacteria that are normally present in small amounts in the vagina. However, the reason that the condition develops is not fully understood. What increases the risk? The following factors may make you more likely to develop this condition:  Having a new sexual partner or multiple sexual partners.  Having unprotected sex.  Douching.  Having an intrauterine device (IUD).  Smoking.  Drug and alcohol abuse.  Taking certain antibiotic medicines.  Being pregnant. You cannot get bacterial vaginosis from toilet seats, bedding, swimming pools, or contact with objects around you. What are the signs or symptoms? Symptoms of this condition include:  Grey or white vaginal discharge. The discharge can also be watery or foamy.  A fish-like odor with discharge, especially after sexual intercourse or during menstruation.  Itching in and around the vagina.  Burning or pain with urination. Some women with bacterial vaginosis have no signs or symptoms. How is this diagnosed? This condition is diagnosed based on:  Your medical history.  A physical exam of the vagina.  Testing a sample of vaginal fluid under a microscope to look for a large amount of bad bacteria or abnormal cells. Your health care provider may use a cotton swab or a small wooden spatula to collect the sample. How is this treated? This condition is treated with antibiotics. These may be given as a pill, a vaginal cream, or a medicine that is put into the vagina (suppository). If the condition comes back after treatment, a second round of antibiotics may be needed. Follow these instructions at home: Medicines  Take over-the-counter and prescription medicines only as told by your health care provider.  Take  or use your antibiotic as told by your health care provider. Do not stop taking or using the antibiotic even if you start to feel better. General instructions  If you have a female sexual partner, tell  her that you have a vaginal infection. She should see her health care provider and be treated if she has symptoms. If you have a female sexual partner, he does not need treatment.  During treatment: ? Avoid sexual activity until you finish treatment. ? Do not douche. ? Avoid alcohol as directed by your health care provider. ? Avoid breastfeeding as directed by your health care provider.  Drink enough water and fluids to keep your urine clear or pale yellow.  Keep the area around your vagina and rectum clean. ? Wash the area daily with warm water. ? Wipe yourself from front to back after using the toilet.  Keep all follow-up visits as told by your health care provider. This is important. How is this prevented?  Do not douche.  Wash the outside of your vagina with warm water only.  Use protection when having sex. This includes latex condoms and dental dams.  Limit how many sexual partners you have. To help prevent bacterial vaginosis, it is best to have sex with just one partner (monogamous).  Make sure you and your sexual partner are tested for STIs.  Wear cotton or cotton-lined underwear.  Avoid wearing tight pants and pantyhose, especially during summer.  Limit the amount of alcohol that you drink.  Do not use any products that contain nicotine or tobacco, such as cigarettes and e-cigarettes. If you need help quitting, ask your health care provider.  Do not use illegal drugs. Where to find more information  Centers for Disease Control and Prevention: SolutionApps.co.za  American Sexual Health Association (ASHA): www.ashastd.org  U.S. Department of Health and Health and safety inspector, Office on Women's Health: ConventionalMedicines.si or http://www.anderson-williamson.info/ Contact a health care provider if:  Your symptoms do not improve, even after treatment.  You have more discharge or pain when urinating.  You have a fever.  You have pain in your  abdomen.  You have pain during sex.  You have vaginal bleeding between periods. Summary  Bacterial vaginosis is a vaginal infection that occurs when the normal balance of bacteria in the vagina is disrupted.  Because bacterial vaginosis increases your risk for STIs (sexually transmitted infections), getting treated can help reduce your risk for chlamydia, gonorrhea, herpes, and HIV (human immunodeficiency virus). Treatment is also important for preventing complications in pregnant women, because the condition can cause an early (premature) delivery.  This condition is treated with antibiotic medicines. These may be given as a pill, a vaginal cream, or a medicine that is put into the vagina (suppository). This information is not intended to replace advice given to you by your health care provider. Make sure you discuss any questions you have with your health care provider. Document Released: 05/06/2005 Document Revised: 09/09/2016 Document Reviewed: 01/20/2016 Elsevier Interactive Patient Education  2019 [employer].

## 2018-07-05 NOTE — MAU Provider Note (Signed)
History     CSN: [id number]  Arrival date and time: 07/05/18 1534   First Provider Initiated Contact with Patient 07/05/18 1658      Chief Complaint  Patient presents with  . Vaginal Discharge   HPI  Ms.  Debra Lucas is a 23 y.o. year old G5P2002 non-pregnant female who presents to MAU reporting cloudy vaginal d/c that has a foul odor x 3 wks and some abdominal pain/cramping; pain rated 3/10. She thinks she has BV. She reports her LMP was 06/26/2018.    Past Medical History:  Diagnosis Date  . Medical history non-contributory     Past Surgical History:  Procedure Laterality Date  . ADENOIDECTOMY    . TONSILLECTOMY      Family History  Problem Relation Age of Onset  . Hypertension Maternal Grandmother     Social History   Tobacco Use  . Smoking status: Passive Smoke Exposure - Never Smoker  . Smokeless tobacco: Never Used  Substance Use Topics  . Alcohol use: No  . Drug use: No    Allergies:  Allergies  Allergen Reactions  . Shellfish Allergy Swelling and Rash    Medications Prior to Admission  Medication Sig Dispense Refill Last Dose  . amoxicillin-clavulanate (AUGMENTIN) 875-125 MG tablet Take 1 tablet by mouth every 12 (twelve) hours. 14 tablet 0   . metroNIDAZOLE (FLAGYL) 500 MG tablet Take 1 tablet (500 mg total) by mouth 2 (two) times daily. 14 tablet 0   . Prenat w/o A-FE-Methfol-FA-DHA (PRENATE DHA) 28-0.6-0.4-300 MG CAPS Take 1 capsule by mouth daily before breakfast. (Patient not taking: Reported on 07/27/2017) 30 capsule 11 Not Taking at Unknown time    Review of Systems  Constitutional: Negative.   HENT: Negative.   Eyes: Negative.   Respiratory: Negative.   Cardiovascular: Negative.   Gastrointestinal: Negative.   Endocrine: Negative.   Genitourinary: Positive for pelvic pain (cramping) and vaginal discharge (cloudy with foul odor).  Musculoskeletal: Negative.   Skin: Negative.   Allergic/Immunologic: Negative.   Neurological:  Negative.   Hematological: Negative.   Psychiatric/Behavioral: Negative.    Physical Exam   Blood pressure 122/70, pulse 85, temperature 98.3 F (36.8 C), temperature source Oral, resp. rate 18, height 5\' 5"  (1.651 m), weight 82.2 kg, last menstrual period 06/26/2018, SpO2 98 %.  Physical Exam  Nursing note and vitals reviewed. Constitutional: She is oriented to person, place, and time. She appears well-developed and well-nourished.  HENT:  Head: Normocephalic and atraumatic.  Eyes: Pupils are equal, round, and reactive to light.  Neck: Normal range of motion.  Cardiovascular: Normal rate and regular rhythm.  Respiratory: Effort normal.  GI: Soft.  Genitourinary:    Genitourinary Comments: Wet prep and GC/CT collected by self-swab   Musculoskeletal: Normal range of motion.  Neurological: She is alert and oriented to person, place, and time.  Skin: Skin is warm and dry.  Psychiatric: She has a normal mood and affect. Her behavior is normal. Judgment and thought content normal.    MAU Course  Procedures  MDM CCUA UPT Wet Prep GC/CT -- pending  Results for orders placed or performed during the hospital encounter of 07/05/18 (from the past 48 hour(s))  GC/Chlamydia probe amp ([place])not at [facility]     Status: None   Collection Time: 07/05/18 12:00 AM  Result Value Ref Range   Chlamydia Negative     Comment: Normal Reference Range - Negative   Neisseria gonorrhea Negative     Comment: Normal  Reference Range - Negative  Wet prep, genital     Status: Abnormal   Collection Time: 07/05/18  3:53 PM  Result Value Ref Range   Yeast Wet Prep HPF POC NONE SEEN NONE SEEN   Trich, Wet Prep NONE SEEN NONE SEEN   Clue Cells Wet Prep HPF POC PRESENT (A) NONE SEEN   WBC, Wet Prep HPF POC FEW (A) NONE SEEN    Comment: MODERATE BACTERIA SEEN   Sperm NONE SEEN     Comment: Performed at [facility] [address], [place], [place] [address]  Urinalysis, Routine w reflex  microscopic     Status: Abnormal   Collection Time: 07/05/18  4:01 PM  Result Value Ref Range   Color, Urine YELLOW YELLOW   APPearance CLEAR CLEAR   Specific Gravity, Urine 1.015 1.005 - 1.030   pH 6.5 5.0 - 8.0   Glucose, UA NEGATIVE NEGATIVE mg/dL   Hgb urine dipstick NEGATIVE NEGATIVE   Bilirubin Urine NEGATIVE NEGATIVE   Ketones, ur NEGATIVE NEGATIVE mg/dL   Protein, ur NEGATIVE NEGATIVE mg/dL   Nitrite NEGATIVE NEGATIVE   Leukocytes,Ua TRACE (A) NEGATIVE    Comment: Performed at [facility], [address] [place], [place] [address]  Urinalysis, Microscopic (reflex)     Status: Abnormal   Collection Time: 07/05/18  4:01 PM  Result Value Ref Range   RBC / HPF 0-5 0 - 5 RBC/hpf   WBC, UA 0-5 0 - 5 WBC/hpf   Bacteria, UA FEW (A) NONE SEEN   Squamous Epithelial / LPF 0-5 0 - 5    Comment: Performed at [facility], [address], [place], [place] [address]    Assessment and Plan  Bacterial vaginosis - Plan: Discharge patient - Rx for Flagyl 500 mg BID x 7 days sent as requested - Information provided on BV - Notified of upcoming MAU GYN policy for [facility] - F/U with GYN prn - Patient verbalized an understanding of the plan of care and agrees.   Raelyn Mora, MSN, CNM 07/05/2018, 4:58 PM

## 2018-07-06 LAB — GC/CHLAMYDIA PROBE AMP (~~LOC~~) NOT AT ARMC
Chlamydia: NEGATIVE
Neisseria Gonorrhea: NEGATIVE

## 2018-07-27 NOTE — Telephone Encounter (Signed)
Hey Suz, There are several patients who their stuff got sent to [facility] but are actually [facility] clients. Not sure why they populated in our box.    Thanks,

## 2018-07-28 ENCOUNTER — Encounter: Payer: Self-pay | Admitting: *Deleted

## 2018-09-18 IMAGING — US US MFM OB COMP +14 WKS
1 series · 14 of 28 positions shown · non-contrast
Comparison: none

[Series 1: us mfm ob comp +14 wks · 105 acquisitions, 14 frames shown]
[im 4/105]
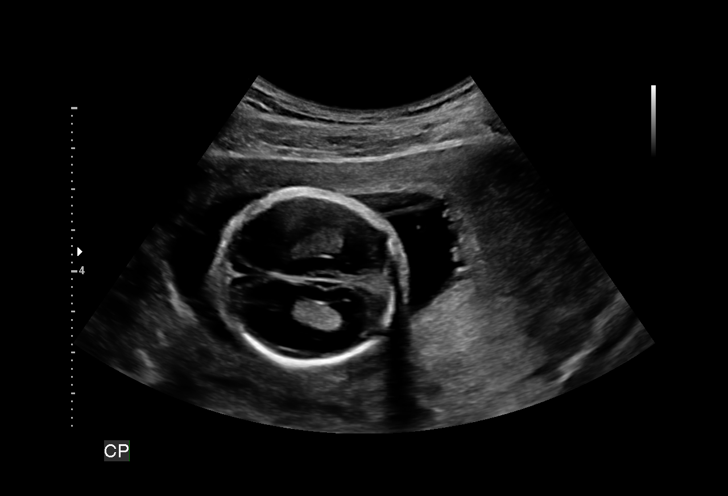
[im 12/105]
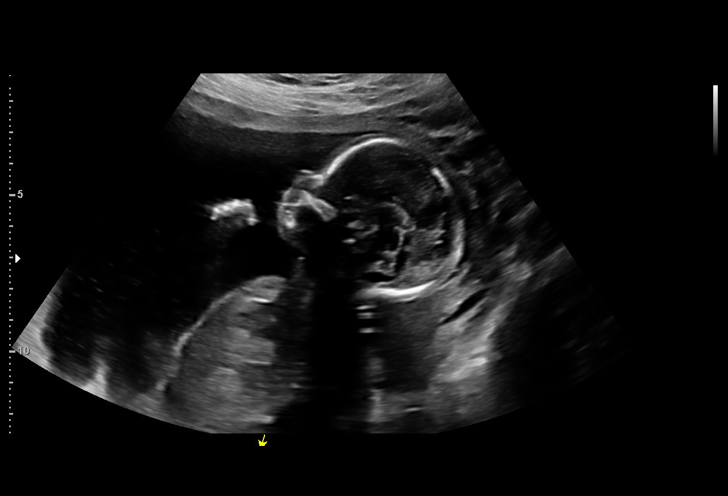
[im 20/105]
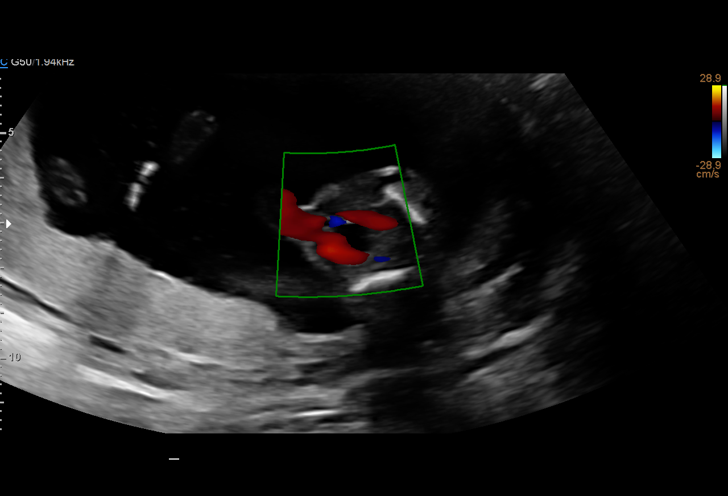
[im 27/105]
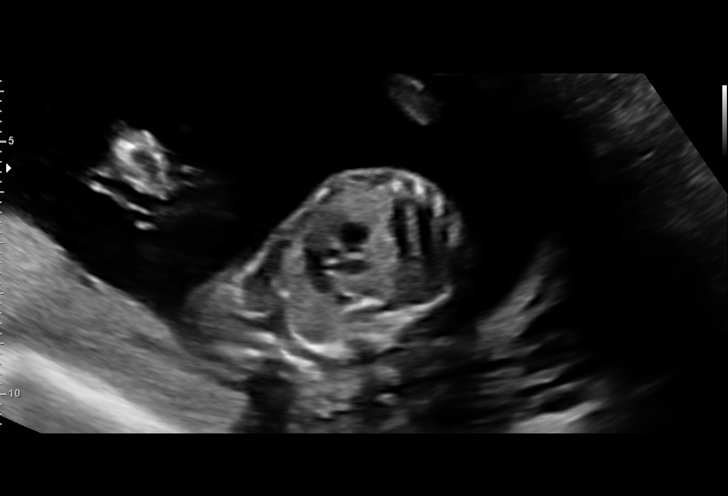
[im 35/105]
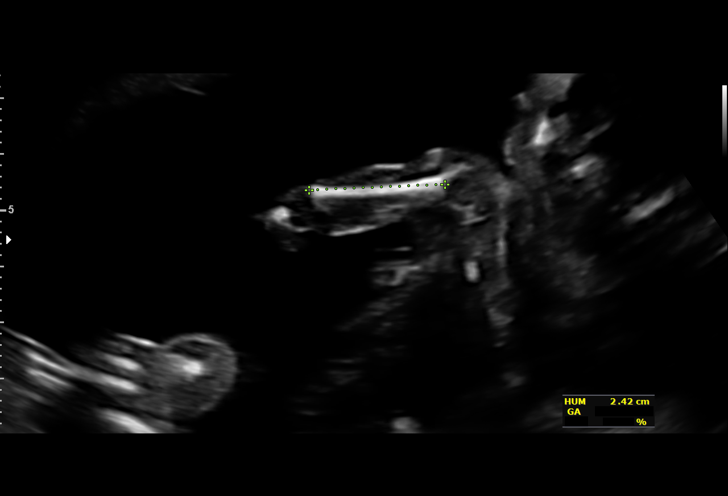
[im 43/105]
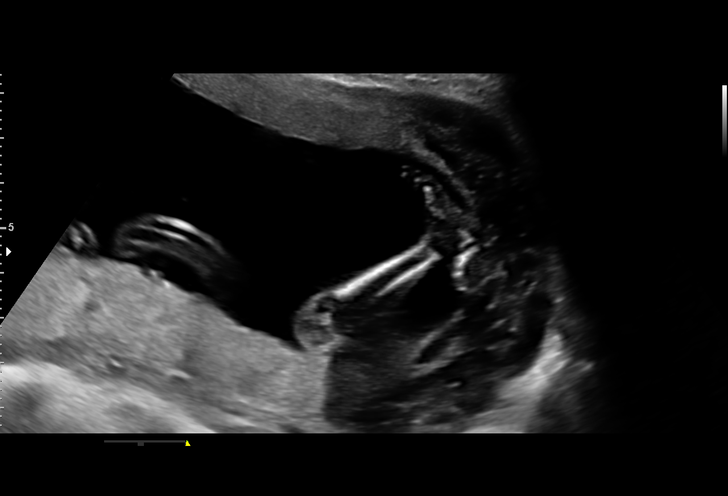
[im 51/105]
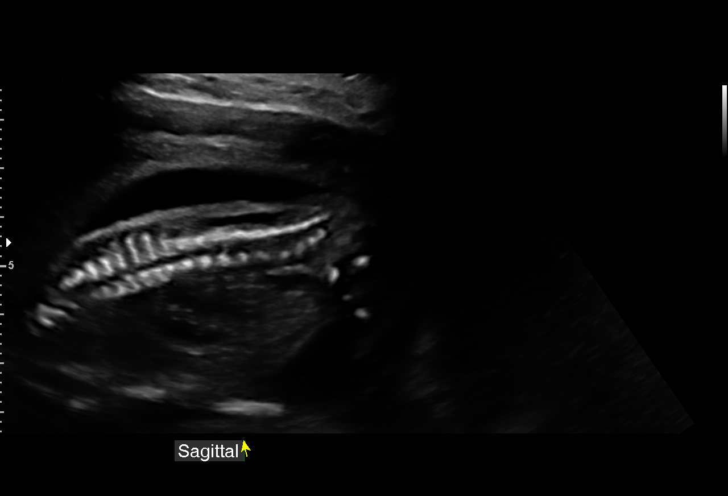
[im 58/105]
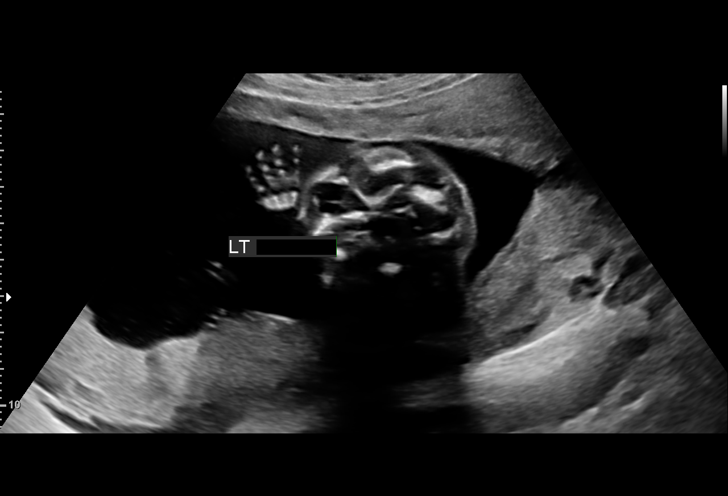
[im 66/105]
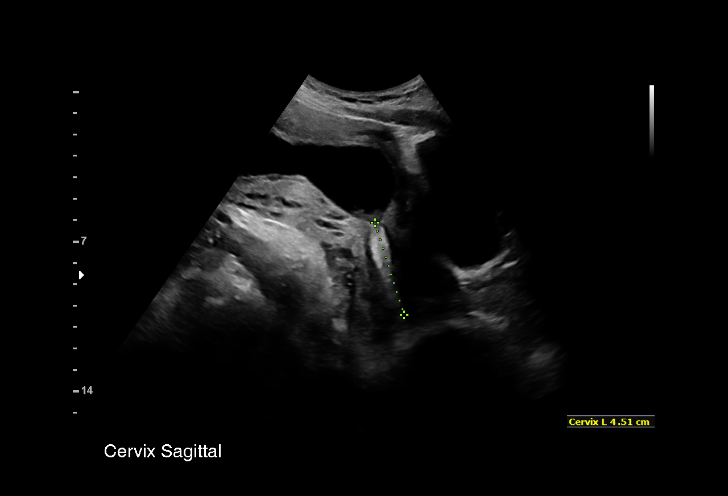
[im 74/105]
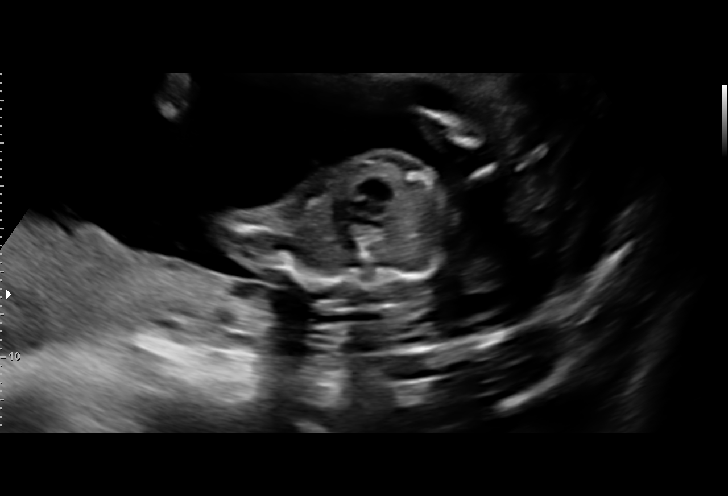
[im 81/105]
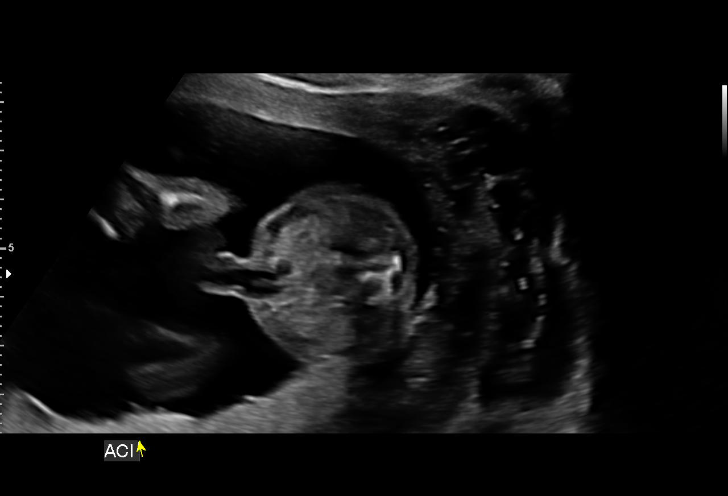
[im 89/105]
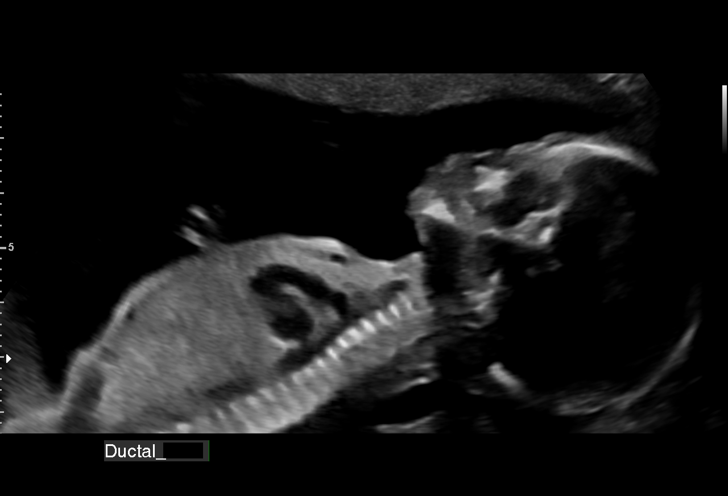
[im 97/105]
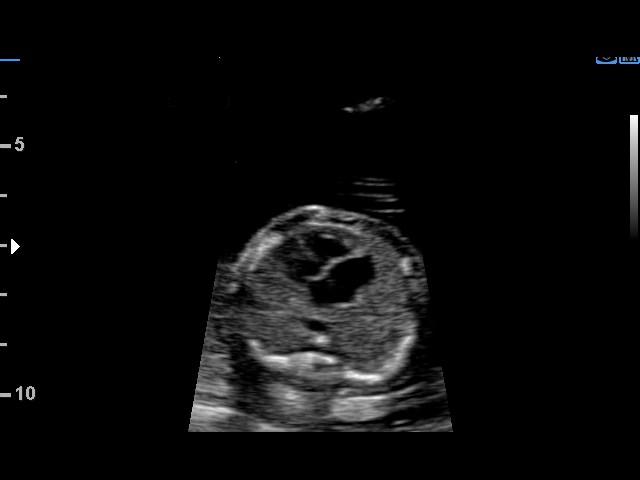
[im 105/105]
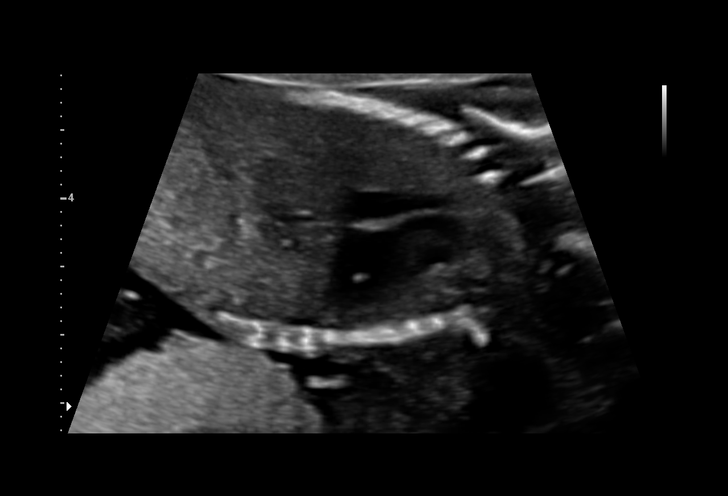

[14 of 28 positions shown; findings below may reference images not displayed]

1  MARIA INES MAREE            [id number]      [id number]     [id number]
Indications

18 weeks gestation of pregnancy
Poor obstetric history: Previous
preeclampsia / eclampsia/gestational HTN
Encounter for fetal anatomic survey
OB History

Blood Type:            Height:  5'5"   Weight (lb):  153       BMI:
Gravidity:    2         Term:   1        Prem:   0        SAB:   0
TOP:          0       Ectopic:  0        Living: 1
Fetal Evaluation

Num Of Fetuses:     1
Fetal Heart         141
Rate(bpm):
Cardiac Activity:   Observed
Presentation:       Variable
Placenta:           Posterior, above cervical os
P. Cord Insertion:  Visualized

Amniotic Fluid
AFI FV:      Subjectively within normal limits

Largest Pocket(cm)
5.5
Biometry

BPD:        43  mm     G. Age:  19w 0d         85  %    CI:        76.55   %    70 - 86
FL/HC:      17.7   %    15.8 - 18
HC:      155.7  mm     G. Age:  18w 3d         60  %    HC/AC:      1.18        1.07 -
AC:      131.6  mm     G. Age:  18w 5d         65  %    FL/BPD:     64.2   %
FL:       27.6  mm     G. Age:  18w 3d         55  %    FL/AC:      21.0   %    20 - 24
HUM:      24.6  mm     G. Age:  17w 5d         41  %
CER:      18.7  mm     G. Age:  18w 2d         57  %
NFT:       2.4  mm
CM:        4.5  mm

Est. FW:     246  gm      0 lb 9 oz     55  %
Gestational Age

LMP:           18w 4d        Date:  03/29/16                 EDD:   01/03/17
U/S Today:     18w 5d                                        EDD:   01/02/17
Best:          18w 1d     Det. By:  Early Ultrasound         EDD:   01/06/17
(05/23/16)
Anatomy

Cranium:               Appears normal         Aortic Arch:            Appears normal
Cavum:                 Appears normal         Ductal Arch:            Appears normal
Ventricles:            Appears normal         Diaphragm:              Appears normal
Choroid Plexus:        Appears normal         Stomach:                Appears normal, left
sided
Cerebellum:            Appears normal         Abdomen:                Appears normal
Posterior Fossa:       Appears normal         Abdominal Wall:         Appears nml (cord
insert, abd wall)
Nuchal Fold:           Appears normal         Cord Vessels:           Appears normal (3
vessel cord)
Face:                  Appears normal         Kidneys:                Appear normal
(orbits and profile)
Lips:                  Appears normal         Bladder:                Appears normal
Thoracic:              Appears normal         Spine:                  Limited views
appear normal
Heart:                 Appears normal         Upper Extremities:      Appears normal
(4CH, axis, and situs
RVOT:                  Appears normal         Lower Extremities:      Appears normal
LVOT:                  Appears normal

Other:  Fetus appears to be a female. Heels visualized. 5th digit visualized,
left. Technically difficult due to fetal position.
Cervix Uterus Adnexa

Cervix
Length:           4.51  cm.
Normal appearance by transabdominal scan.

Uterus
No abnormality visualized.

Left Ovary
Within normal limits.

Right Ovary
Within normal limits.
Impression

Singleton intrauterine pregnancy at 18+1 weeks, here for
anatomic survey
Review of the anatomy shows no sonographic markers for
aneuploidy or structural anomalies
However, cardiac evaluation should be considered
suboptimal secondary to early gestational age
Amniotic fluid volume is normal
Estimated fetal weight is 246g which is growth in the 55th
percentile
Recommendations

Normal growth and development. would consider repeat scan
in 4 weeks to confirm normal cardiac anatomy

## 2019-08-15 ENCOUNTER — Ambulatory Visit
Admission: EM | Admit: 2019-08-15 | Discharge: 2019-08-15 | Disposition: A | Discharge status: Home / Self Care | Payer: Self-pay | Attending: Family Medicine | Admitting: Family Medicine

## 2019-08-15 ENCOUNTER — Encounter: Payer: Self-pay | Admitting: Emergency Medicine

## 2019-08-15 ENCOUNTER — Other Ambulatory Visit: Payer: Self-pay

## 2019-08-15 DIAGNOSIS — L301 Dyshidrosis [pompholyx]: Secondary | ICD-10-CM

## 2019-08-15 MED ORDER — FLUOCINONIDE 0.05 % EX OINT: 1.0000 "application " | TOPICAL_OINTMENT | Freq: Two times a day (BID) | CUTANEOUS | 0 refills | Status: DC

## 2019-08-15 MED ORDER — PREDNISONE 50 MG PO TABS: ORAL_TABLET | ORAL | 0 refills | Status: DC

## 2019-08-15 NOTE — ED Provider Notes (Signed)
In [place]    CSN: [id number] Arrival date & time: 08/15/19  1102      History   Chief Complaint Chief Complaint  Patient presents with  . Rash    HPI Debra Lucas is a 24 y.o. female.   Initial [facility] patient visit  Pt presents to [facility] for assessment of rash/blisters developing on bilateral hands since December.    Patient works at [employer] where she wears gloves.  Symptoms began last December after having her nails done.  The itchy blisters are just on the palmar side of her hands with some interdigital involvement.     Past Medical History:  Diagnosis Date  . Medical history non-contributory     Patient Active Problem List   Diagnosis Date Noted  . Bacterial vaginosis 07/05/2018  . History of group B Streptococcus (GBS) infection 07/16/2016    Past Surgical History:  Procedure Laterality Date  . ADENOIDECTOMY    . TONSILLECTOMY      OB History    Gravida  2   Para  2   Term  2   Preterm      AB      Living  2     SAB      TAB      Ectopic      Multiple  0   Live Births  2            Home Medications    Prior to Admission medications   Medication Sig Start Date End Date Taking? Authorizing Provider  fluocinonide ointment (LIDEX) 9.56 % Apply 1 application topically 2 (two) times daily. 08/15/19   Robyn Haber, MD  predniSONE (DELTASONE) 50 MG tablet One daily with food 08/15/19   Robyn Haber, MD    Family History Family History  Problem Relation Age of Onset  . Hypertension Maternal Grandmother     Social History Social History   Tobacco Use  . Smoking status: Passive Smoke Exposure - Never Smoker  . Smokeless tobacco: Never Used  Substance Use Topics  . Alcohol use: No  . Drug use: No     Allergies   Shellfish allergy   Review of Systems Review of Systems  Skin: Positive for rash.  All other systems reviewed and are negative.    Physical Exam Triage Vital Signs ED Triage  Vitals  Enc Vitals Group     BP 08/15/19 1108 121/69     Pulse Rate 08/15/19 1108 82     Resp 08/15/19 1108 16     Temp 08/15/19 1108 97.8 F (36.6 C)     Temp Source 08/15/19 1108 Oral     SpO2 08/15/19 1108 98 %     Weight --      Height --      Head Circumference --      Peak Flow --      Pain Score 08/15/19 1112 5     Pain Loc --      Pain Edu? --      Excl. in [place]? --    No data found.  Updated Vital Signs BP 121/69 (BP Location: Left Arm)   Pulse 82   Temp 97.8 F (36.6 C) (Oral)   Resp 16   LMP 08/11/2019   SpO2 98%    Physical Exam Vitals and nursing note reviewed.  Constitutional:      Appearance: Normal appearance. She is obese.  Eyes:     Conjunctiva/sclera: Conjunctivae  normal.  Cardiovascular:     Rate and Rhythm: Normal rate.  Pulmonary:     Effort: Pulmonary effort is normal.  Musculoskeletal:        General: Normal range of motion.     Cervical back: Normal range of motion and neck supple.  Skin:    General: Skin is warm and dry.     Findings: Rash present.  Neurological:     General: No focal deficit present.     Mental Status: She is alert and oriented to person, place, and time.  Psychiatric:        Mood and Affect: Mood normal.        Thought Content: Thought content normal.        Judgment: Judgment normal.        UC Treatments / Results  Labs (all labs ordered are listed, but only abnormal results are displayed) Labs Reviewed - No data to display  EKG   Radiology No results found.  Procedures Procedures (including critical care time)  Medications Ordered in UC Medications - No data to display  Initial Impression / Assessment and Plan / UC Course  I have reviewed the triage vital signs and the nursing notes.  Pertinent labs & imaging results that were available during my care of the patient were reviewed by me and considered in my medical decision making (see chart for details).    Final Clinical Impressions(s) / UC  Diagnoses   Final diagnoses:  Dyshidrotic eczema     Discharge Instructions     Check the kind of gloves you wear at work to make sure that they are not Latex.  (we use Nitrile gloves here)  If the rash does not improve, or if it comes back, please return for further evaluation.  Avoid purelle and cleaning materials.  Use gently soap like Dove when you need to wash.    ED Prescriptions    Medication Sig Dispense Auth. Provider   predniSONE (DELTASONE) 50 MG tablet One daily with food 5 tablet Elvina Sidle, MD   fluocinonide ointment (LIDEX) 0.05 % Apply 1 application topically 2 (two) times daily. 60 g Elvina Sidle, MD     I have reviewed the PDMP during this encounter.   Elvina Sidle, MD 08/15/19 1149

## 2019-08-15 NOTE — ED Notes (Signed)
Patient able to ambulate independently  

## 2019-08-15 NOTE — ED Triage Notes (Signed)
Pt presents to [facility] for assessment of rash/blisters developing on bilateral hands since December.

## 2019-08-15 NOTE — Discharge Instructions (Signed)
Check the kind of gloves you wear at work to make sure that they are not Latex.  (we use Nitrile gloves here)  If the rash does not improve, or if it comes back, please return for further evaluation.  Avoid purelle and cleaning materials.  Use gently soap like Dove when you need to wash.

## 2019-08-30 ENCOUNTER — Ambulatory Visit
Admission: EM | Admit: 2019-08-30 | Discharge: 2019-08-30 | Disposition: A | Discharge status: Home / Self Care | Payer: Self-pay

## 2019-10-18 ENCOUNTER — Other Ambulatory Visit: Payer: Self-pay

## 2019-10-18 ENCOUNTER — Encounter: Payer: Self-pay | Admitting: *Deleted

## 2019-10-18 ENCOUNTER — Ambulatory Visit
Admission: EM | Admit: 2019-10-18 | Discharge: 2019-10-18 | Disposition: A | Discharge status: Home / Self Care | Payer: Medicaid Other | Attending: Family Medicine | Admitting: Family Medicine

## 2019-10-18 DIAGNOSIS — N760 Acute vaginitis: Secondary | ICD-10-CM

## 2019-10-18 DIAGNOSIS — N3000 Acute cystitis without hematuria: Secondary | ICD-10-CM

## 2019-10-18 LAB — POCT URINALYSIS DIP (MANUAL ENTRY)
Glucose, UA: 100 mg/dL — AB
Ketones, POC UA: NEGATIVE mg/dL
Nitrite, UA: POSITIVE — AB
Protein Ur, POC: >=300 mg/dL — AB
Spec Grav, UA: 1.025 (ref 1.010–1.025)
Urobilinogen, UA: 4 E.U./dL — AB
pH, UA: 7 (ref 5.0–8.0)

## 2019-10-18 LAB — POCT URINE PREGNANCY: Preg Test, Ur: NEGATIVE

## 2019-10-18 MED ORDER — CEPHALEXIN 500 MG PO CAPS: 500.0000 mg | ORAL_CAPSULE | Freq: Two times a day (BID) | ORAL | 0 refills | Status: AC

## 2019-10-18 MED ORDER — METRONIDAZOLE 500 MG PO TABS: 500.0000 mg | ORAL_TABLET | Freq: Two times a day (BID) | ORAL | 0 refills | Status: DC

## 2019-10-18 NOTE — ED Triage Notes (Signed)
C/O dysuria, hematuria x 2 days without fever.  Denies back pain, n/v.   Also c/o foul vaginal odor without discharge x 1 wk.

## 2019-10-18 NOTE — ED Provider Notes (Signed)
[name]    CSN: [id number] Arrival date & time: 10/18/19  1833      History   Chief Complaint Chief Complaint  Patient presents with  . Dysuria  . Vaginal Problem    HPI Debra Lucas is a 24 y.o. female.   HPI  This is a 24 y.o. female who presents today with UTI symptoms for 3 days. Symptoms present include dysuria, frequency, and urgency. Patient's last menstrual period was 09/23/2019 (exact date). Patient has a history of bacterial vaginosis and is concern today for foul vaginal odor. Denies recent change in sexual partner. Denies any other associated symptoms.  History reviewed. No pertinent past medical history.  Patient Active Problem List   Diagnosis Date Noted  . Bacterial vaginosis 07/05/2018  . History of group B Streptococcus (GBS) infection 07/16/2016    Past Surgical History:  Procedure Laterality Date  . ADENOIDECTOMY    . TONSILLECTOMY      OB History    Gravida  2   Para  2   Term  2   Preterm      AB      Living  2     SAB      TAB      Ectopic      Multiple  0   Live Births  2            Home Medications    Prior to Admission medications   Medication Sig Start Date End Date Taking? Authorizing Provider  Phenazopyridine HCl (PYRIDIUM PO) Take by mouth.   Yes [provider]  fluocinonide ointment (LIDEX) 3.38 % Apply 1 application topically 2 (two) times daily. 08/15/19   Robyn Haber, MD  predniSONE (DELTASONE) 50 MG tablet One daily with food 08/15/19   Robyn Haber, MD    Family History Family History  Problem Relation Age of Onset  . Hypertension Maternal Grandmother     Social History Social History   Tobacco Use  . Smoking status: Passive Smoke Exposure - Never Smoker  . Smokeless tobacco: Never Used  Substance Use Topics  . Alcohol use: No  . Drug use: No     Allergies   Shellfish allergy   Review of Systems Review of Systems Pertinent negatives listed in  HPI  Physical Exam Triage Vital Signs ED Triage Vitals  Enc Vitals Group     BP 10/18/19 1844 138/80     Pulse Rate 10/18/19 1844 87     Resp 10/18/19 1844 16     Temp 10/18/19 1844 98.7 F (37.1 C)     Temp Source 10/18/19 1844 Oral     SpO2 10/18/19 1844 98 %     Weight --      Height --      Head Circumference --      Peak Flow --      Pain Score 10/18/19 1845 0     Pain Loc --      Pain Edu? --      Excl. in [place]? --    No data found.  Updated Vital Signs BP 138/80   Pulse 87   Temp 98.7 F (37.1 C) (Oral)   Resp 16   LMP 09/23/2019 (Exact Date)   SpO2 98%   Visual Acuity Right Eye Distance:   Left Eye Distance:   Bilateral Distance:    Right Eye Near:   Left Eye Near:    Bilateral Near:  Physical Exam General appearance: alert, well developed, well nourished, cooperative and in no distress Head: Normocephalic, without obvious abnormality, atraumatic Respiratory: Respirations even and unlabored, normal respiratory rate Heart: rate and rhythm normal.   Abdomen: BS +, -CVA tenderness Extremities: No gross deformities Skin: Skin color, texture, turgor normal. No rashes seen  Psych: Appropriate mood and affect. Neurologic: Normal gait and GCS 15  UC Treatments / Results  Labs (all labs ordered are listed, but only abnormal results are displayed) Labs Reviewed  URINE CULTURE - Abnormal; Notable for the following components:      Result Value   Culture MULTIPLE SPECIES PRESENT, SUGGEST RECOLLECTION (*)    All other components within normal limits  POCT URINALYSIS DIP (MANUAL ENTRY) - Abnormal; Notable for the following components:   Color, UA orange (*)    Clarity, UA cloudy (*)    Glucose, UA =100 (*)    Bilirubin, UA small (*)    Blood, UA large (*)    Protein Ur, POC >=300 (*)    Urobilinogen, UA 4.0 (*)    Nitrite, UA Positive (*)    Leukocytes, UA Trace (*)    All other components within normal limits  CERVICOVAGINAL ANCILLARY ONLY -  Abnormal; Notable for the following components:   Bacterial Vaginitis (gardnerella) Positive (*)    All other components within normal limits  POCT URINE PREGNANCY - Normal    EKG   Radiology No results found.  Procedures Procedures (including critical care time)  Medications Ordered in UC Medications - No data to display  Initial Impression / Assessment and Plan / UC Course  I have reviewed the triage vital signs and the nursing notes.  Pertinent labs & imaging results that were available during my care of the patient were reviewed by me and considered in my medical decision making (see chart for details).     UA significant for acute cystitis. Will treatment empirically with Kelfex 500 mg twice daily x 10 days.  Vaginitis (hx of recurrent BV) Metronidazole 500 mg BID x 7 days. Vaginal cytology pending. Urine culture pending. Urine pregnancy negative. If symptoms worsen, follow-up with PCP. ER is symptoms become severe.  Final Clinical Impressions(s) / UC Diagnoses   Final diagnoses:  Acute cystitis without hematuria  Acute vaginitis   Discharge Instructions   None    ED Prescriptions    Medication Sig Dispense Auth. Provider   metroNIDAZOLE (FLAGYL) 500 MG tablet Take 1 tablet (500 mg total) by mouth 2 (two) times daily with a meal. DO NOT CONSUME ALCOHOL WHILE TAKING THIS MEDICATION. 14 tablet Bing Neighbors, FNP   cephALEXin (KEFLEX) 500 MG capsule Take 1 capsule (500 mg total) by mouth 2 (two) times daily for 10 days. 20 capsule Bing Neighbors, FNP     PDMP not reviewed this encounter.   Bing Neighbors, [place] 10/21/19 [phone]

## 2019-10-20 LAB — CERVICOVAGINAL ANCILLARY ONLY
Bacterial Vaginitis (gardnerella): POSITIVE — AB
Candida Glabrata: NEGATIVE
Candida Vaginitis: NEGATIVE
Chlamydia: NEGATIVE
Comment: NEGATIVE
Comment: NEGATIVE
Comment: NEGATIVE
Comment: NEGATIVE
Comment: NEGATIVE
Comment: NORMAL
Neisseria Gonorrhea: NEGATIVE
Trichomonas: NEGATIVE

## 2019-10-20 LAB — URINE CULTURE

## 2020-04-10 ENCOUNTER — Ambulatory Visit
Admission: RE | Admit: 2020-04-10 | Discharge: 2020-04-10 | Disposition: A | Discharge status: Home / Self Care | Payer: Medicaid Other | Source: Ambulatory Visit | Attending: Emergency Medicine | Admitting: Emergency Medicine

## 2020-04-10 ENCOUNTER — Other Ambulatory Visit: Payer: Self-pay

## 2020-04-10 VITALS — BP 125/81 | HR 86 | Temp 98.4°F | Resp 16

## 2020-04-10 DIAGNOSIS — Z113 Encounter for screening for infections with a predominantly sexual mode of transmission: Secondary | ICD-10-CM | POA: Diagnosis not present

## 2020-04-10 DIAGNOSIS — N923 Ovulation bleeding: Secondary | ICD-10-CM | POA: Diagnosis not present

## 2020-04-10 LAB — POCT URINE PREGNANCY: Preg Test, Ur: NEGATIVE

## 2020-04-10 NOTE — Discharge Instructions (Signed)

## 2020-04-10 NOTE — ED Provider Notes (Signed)
[name]    CSN: [id number] Arrival date & time: 04/10/20  1601      History   Chief Complaint Chief Complaint  Patient presents with  . Vaginal Bleeding    HPI Debra Lucas is a 24 y.o. female  Presenting for irregular menstrual bleeding.  Reports brown spotting with malodor a few days before her cycle on 03/30/20.  Cycle was normal for her.  Has GYN appt 12/2, intends to keep, but wanted pregnancy testing & STI before then.  No urinary symptoms, pain.  History reviewed. No pertinent past medical history.  Patient Active Problem List   Diagnosis Date Noted  . Bacterial vaginosis 07/05/2018  . History of group B Streptococcus (GBS) infection 07/16/2016    Past Surgical History:  Procedure Laterality Date  . ADENOIDECTOMY    . TONSILLECTOMY      OB History    Gravida  2   Para  2   Term  2   Preterm      AB      Living  2     SAB      TAB      Ectopic      Multiple  0   Live Births  2            Home Medications    Prior to Admission medications   Not on File    Family History Family History  Problem Relation Age of Onset  . Hypertension Maternal Grandmother     Social History Social History   Tobacco Use  . Smoking status: Passive Smoke Exposure - Never Smoker  . Smokeless tobacco: Never Used  Vaping Use  . Vaping Use: Never used  Substance Use Topics  . Alcohol use: No  . Drug use: No     Allergies   Shellfish allergy   Review of Systems Review of Systems  Constitutional: Negative for fatigue and fever.  Respiratory: Negative for cough and shortness of breath.   Cardiovascular: Negative for chest pain and palpitations.  Gastrointestinal: Negative for constipation and diarrhea.  Genitourinary: Positive for menstrual problem. Negative for dysuria, flank pain, frequency, hematuria, pelvic pain, urgency, vaginal discharge and vaginal pain.     Physical Exam Triage Vital Signs ED Triage  Vitals  Enc Vitals Group     BP 04/10/20 1010 125/81     Pulse Rate 04/10/20 1010 86     Resp 04/10/20 1010 16     Temp 04/10/20 1010 98.4 F (36.9 C)     Temp Source 04/10/20 1010 Oral     SpO2 04/10/20 1010 100 %     Weight --      Height --      Head Circumference --      Peak Flow --      Pain Score 04/10/20 1026 0     Pain Loc --      Pain Edu? --      Excl. in [place]? --    No data found.  Updated Vital Signs BP 125/81 (BP Location: Left Arm)   Pulse 86   Temp 98.4 F (36.9 C) (Oral)   Resp 16   LMP 03/30/2020   SpO2 100%   Visual Acuity Right Eye Distance:   Left Eye Distance:   Bilateral Distance:    Right Eye Near:   Left Eye Near:    Bilateral Near:     Physical Exam Constitutional:      General: She  is not in acute distress. HENT:     Head: Normocephalic and atraumatic.  Eyes:     General: No scleral icterus.    Pupils: Pupils are equal, round, and reactive to light.  Cardiovascular:     Rate and Rhythm: Normal rate.  Pulmonary:     Effort: Pulmonary effort is normal.  Abdominal:     General: Bowel sounds are normal.     Palpations: Abdomen is soft.     Tenderness: There is no abdominal tenderness. There is no right CVA tenderness, left CVA tenderness or guarding.  Genitourinary:    Comments: Patient declined, self-swab performed Skin:    Coloration: Skin is not jaundiced or pale.  Neurological:     Mental Status: She is alert and oriented to person, place, and time.      UC Treatments / Results  Labs (all labs ordered are listed, but only abnormal results are displayed) Labs Reviewed  POCT URINE PREGNANCY  CERVICOVAGINAL ANCILLARY ONLY    EKG   Radiology No results found.  Procedures Procedures (including critical care time)  Medications Ordered in UC Medications - No data to display  Initial Impression / Assessment and Plan / UC Course  I have reviewed the triage vital signs and the nursing notes.  Pertinent labs &  imaging results that were available during my care of the patient were reviewed by me and considered in my medical decision making (see chart for details).     Urine preg negative, cytology pending.  Will treat for STI/BV if indicated.  Will keep GYN appt 12/2.  Return precautions discussed, pt verbalized understanding and is agreeable to plan. Final Clinical Impressions(s) / UC Diagnoses   Final diagnoses:  Spotting between menses  Screening examination for venereal disease     Discharge Instructions     Testing for chlamydia, gonorrhea, trichomonas is pending: please look for these results on the MyChart app/website.  We will notify you if you are positive and outline treatment at that time.  Important to avoid all forms of sexual intercourse (oral, vaginal, anal) with any/all partners for the next 7 days to avoid spreading/reinfecting. Any/all sexual partners should be notified of testing/treatment today.  Return for persistent/worsening symptoms or if you develop fever, abdominal or pelvic pain, discharge, genital pain, blood in your urine, or are re-exposed to an STI.    ED Prescriptions    None     PDMP not reviewed this encounter.   Hall-Potvin, [place], [place] 04/10/20 1050

## 2020-04-10 NOTE — ED Triage Notes (Addendum)
Pt c/o vaginal spotting with an odor a week and a half go. States started her menstrual cycle 11/11. States never has abnormal bleeding. States can't get in to see OBGYN until the end of December. States has lower abdomen cramping and still has a weird smell. Denies concerns for STD.

## 2020-04-12 ENCOUNTER — Telehealth (HOSPITAL_COMMUNITY): Payer: Self-pay | Admitting: Emergency Medicine

## 2020-04-12 LAB — CERVICOVAGINAL ANCILLARY ONLY
Bacterial Vaginitis (gardnerella): POSITIVE — AB
Chlamydia: NEGATIVE
Comment: NEGATIVE
Comment: NEGATIVE
Comment: NEGATIVE
Comment: NORMAL
Neisseria Gonorrhea: NEGATIVE
Trichomonas: NEGATIVE

## 2020-04-12 MED ORDER — METRONIDAZOLE 500 MG PO TABS: 500.0000 mg | ORAL_TABLET | Freq: Two times a day (BID) | ORAL | 0 refills | Status: DC

## 2020-04-20 ENCOUNTER — Encounter: Payer: Self-pay | Admitting: Obstetrics and Gynecology

## 2020-04-20 ENCOUNTER — Ambulatory Visit (INDEPENDENT_AMBULATORY_CARE_PROVIDER_SITE_OTHER): Payer: Medicaid Other | Admitting: Obstetrics and Gynecology

## 2020-04-20 ENCOUNTER — Other Ambulatory Visit (HOSPITAL_COMMUNITY)
Admission: RE | Admit: 2020-04-20 | Discharge: 2020-04-20 | Disposition: A | Discharge status: Home / Self Care | Payer: Medicaid Other | Source: Ambulatory Visit | Attending: Obstetrics and Gynecology | Admitting: Obstetrics and Gynecology

## 2020-04-20 ENCOUNTER — Other Ambulatory Visit: Payer: Self-pay

## 2020-04-20 VITALS — BP 118/74 | HR 90 | Ht 65.0 in | Wt 198.0 lb

## 2020-04-20 DIAGNOSIS — Z01419 Encounter for gynecological examination (general) (routine) without abnormal findings: Secondary | ICD-10-CM | POA: Insufficient documentation

## 2020-04-20 MED ORDER — ONDANSETRON HCL 4 MG PO TABS: 4.0000 mg | ORAL_TABLET | Freq: Three times a day (TID) | ORAL | 0 refills | Status: DC | PRN

## 2020-04-20 NOTE — Addendum Note (Signed)
Addended by: Marya Landry D on: 04/20/2020 11:44 AM   Modules accepted: Orders

## 2020-04-20 NOTE — Progress Notes (Signed)
GYNECOLOGY ANNUAL PREVENTATIVE CARE ENCOUNTER NOTE  History:     Debra Lucas is a 24 y.o. G62P2002 female here for a routine annual gynecologic exam.  Current complaints: none. Currently taking flagyl for BV, has some vomiting with using.    Denies abnormal vaginal bleeding, discharge, pelvic pain, problems with intercourse or other gynecologic concerns.    Gynecologic History Patient's last menstrual period was 03/30/2020. Contraception: none Last Pap: 2017. Results were: normal with negative HPV Last mammogram: NA  Obstetric History OB History  Gravida Para Term Preterm AB Living  2 2 2     2   SAB TAB Ectopic Multiple Live Births        0 2    # Outcome Date GA Lbr Len/2nd Weight Sex Delivery Anes PTL Lv  2 Term 01/02/17 102w6d 18:48 / 00:21 7 lb 8.1 oz (3.405 kg) F Vag-Spont EPI  LIV  1 Term 09/19/15 [redacted]w[redacted]d / 00:36 6 lb 7.5 oz (2.935 kg) F Vag-Spont EPI  LIV    History reviewed. No pertinent past medical history.  Past Surgical History:  Procedure Laterality Date  . ADENOIDECTOMY    . TONSILLECTOMY      Current Outpatient Medications on File Prior to Visit  Medication Sig Dispense Refill  . metroNIDAZOLE (FLAGYL) 500 MG tablet Take 1 tablet (500 mg total) by mouth 2 (two) times daily. 14 tablet 0   No current facility-administered medications on file prior to visit.    Allergies  Allergen Reactions  . Shellfish Allergy Swelling and Rash    Social History:  reports that she is a non-smoker but has been exposed to tobacco smoke. She has never used smokeless tobacco. She reports that she does not drink alcohol and does not use drugs.  Family History  Problem Relation Age of Onset  . Hypertension Maternal Grandmother     The following portions of the patient's history were reviewed and updated as appropriate: allergies, current medications, past family history, past medical history, past social history, past surgical history and problem list.  Review of  Systems Pertinent items noted in HPI and remainder of comprehensive ROS otherwise negative.  Physical Exam:  BP 118/74   Pulse 90   Ht 5\' 5"  (1.651 m)   Wt 198 lb (89.8 kg)   LMP 03/30/2020   BMI 32.95 kg/m  CONSTITUTIONAL: Well-developed, well-nourished female in no acute distress.  HENT:  Normocephalic, atraumatic, External right and left ear normal.  EYES: Conjunctivae and EOM are normal. Pupils are equal, round, and reactive to light. No scleral icterus.  NECK: Normal range of motion, supple, no masses.  Normal thyroid.  SKIN: Skin is warm and dry. No rash noted. Not diaphoretic. No erythema. No pallor. MUSCULOSKELETAL: Normal range of motion. No tenderness.  No cyanosis, clubbing, or edema.   NEUROLOGIC: Alert and oriented to person, place, and time. Normal reflexes, muscle tone coordination.  PSYCHIATRIC: Normal mood and affect. Normal behavior. Normal judgment and thought content. CARDIOVASCULAR: Normal heart rate noted, regular rhythm RESPIRATORY: Clear to auscultation bilaterally. Effort and breath sounds normal, no problems with respiration noted. BREASTS: Symmetric in size. No masses, tenderness, skin changes, nipple drainage, or lymphadenopathy bilaterally. Performed in the presence of a chaperone. ABDOMEN: Soft, no distention noted.  No tenderness, rebound or guarding.  PELVIC: Normal appearing external genitalia and urethral meatus; normal appearing vaginal mucosa and cervix.  No abnormal discharge noted.  Pap smear obtained.  Normal uterine size, no other palpable masses, no uterine or  adnexal tenderness.  Performed in the presence of a chaperone.   Assessment and Plan:   1. Women's annual routine gynecological examination   Care caps undated Declined BC  She is fully vaccinated from Covid-19  Will follow up results of pap smear and manage accordingly. Routine preventative health maintenance measures emphasized. Please refer to After Visit Summary for other  counseling recommendations.    Everardo Voris, Harolyn Rutherford, NP [facility] [employer], [facility]

## 2020-04-24 LAB — CYTOLOGY - PAP: Diagnosis: NEGATIVE

## 2020-06-22 ENCOUNTER — Other Ambulatory Visit: Payer: Self-pay

## 2020-06-22 ENCOUNTER — Ambulatory Visit (INDEPENDENT_AMBULATORY_CARE_PROVIDER_SITE_OTHER): Payer: Medicaid Other

## 2020-06-22 ENCOUNTER — Ambulatory Visit
Admission: EM | Admit: 2020-06-22 | Discharge: 2020-06-22 | Disposition: A | Discharge status: Home / Self Care | Payer: Medicaid Other | Attending: Family Medicine | Admitting: Family Medicine

## 2020-06-22 DIAGNOSIS — W208XXA Other cause of strike by thrown, projected or falling object, initial encounter: Secondary | ICD-10-CM

## 2020-06-22 DIAGNOSIS — M79671 Pain in right foot: Secondary | ICD-10-CM

## 2020-06-22 DIAGNOSIS — S99921A Unspecified injury of right foot, initial encounter: Secondary | ICD-10-CM | POA: Diagnosis not present

## 2020-06-22 MED ORDER — IBUPROFEN 600 MG PO TABS: 600.0000 mg | ORAL_TABLET | Freq: Three times a day (TID) | ORAL | 0 refills | Status: DC | PRN

## 2020-06-22 NOTE — ED Notes (Signed)
Dress applied to patients middle toe. 2x2 used and secured with kirlex per provider. Pt tolerated well.  Edu provided.

## 2020-06-22 NOTE — Discharge Instructions (Addendum)
Take ibuprofen 600 mg every 8 hours as needed for toe swelling and pain.  You may apply ice to reduce the swelling.  Recommend keeping toe wrapped to reduce swelling and prevent reinjury. Reviewed imaging no obvious fracture seen.

## 2020-06-22 NOTE — ED Triage Notes (Signed)
Patient presents to Urgent Care with complaints of right foot pain with swelling. Pt states lid of a pot fell on top. She reports increased pain with movement. Reports a 5/10 throbbing pain.

## 2020-06-22 NOTE — ED Provider Notes (Signed)
[name]    CSN: [id number] Arrival date & time: 06/22/20  9326      History   Chief Complaint Chief Complaint  Patient presents with  . Right foot pain/Swelling    HPI Debra Lucas is a 25 y.o. female.   HPI Patient was cooking and reports that she accidentally dropped a pan lid which landed directly on the upper top region of right foot injurying the proximal portion of 1-3 rd toes. She reports pain was experienced immediately. Today tenderness is present with weight bearing and most prominently pain is present in long toe at DIP joint and at the nail bed for toe. Pain with movement of toe. She has not taken medication for pain since injury occurred.  History reviewed. No pertinent past medical history.  Patient Active Problem List   Diagnosis Date Noted  . Women's annual routine gynecological examination 04/20/2020  . Bacterial vaginosis 07/05/2018  . History of group B Streptococcus (GBS) infection 07/16/2016    Past Surgical History:  Procedure Laterality Date  . ADENOIDECTOMY    . TONSILLECTOMY      OB History    Gravida  2   Para  2   Term  2   Preterm      AB      Living  2     SAB      IAB      Ectopic      Multiple  0   Live Births  2            Home Medications    Prior to Admission medications   Medication Sig Start Date End Date Taking? Authorizing Provider  ibuprofen (ADVIL) 600 MG tablet Take 1 tablet (600 mg total) by mouth every 8 (eight) hours as needed. 06/22/20  Yes Bing Neighbors, FNP  metroNIDAZOLE (FLAGYL) 500 MG tablet Take 1 tablet (500 mg total) by mouth 2 (two) times daily. 04/12/20   Lamptey, Britta Mccreedy, MD  ondansetron (ZOFRAN) 4 MG tablet Take 1 tablet (4 mg total) by mouth every 8 (eight) hours as needed for nausea or vomiting. 04/20/20   Rasch, Harolyn Rutherford, NP    Family History Family History  Problem Relation Age of Onset  . Hypertension Maternal Grandmother     Social History Social  History   Tobacco Use  . Smoking status: Passive Smoke Exposure - Never Smoker  . Smokeless tobacco: Never Used  Vaping Use  . Vaping Use: Never used  Substance Use Topics  . Alcohol use: No  . Drug use: No     Allergies   Shellfish allergy   Review of Systems Review of Systems Pertinent negatives listed in HPI  Physical Exam Triage Vital Signs ED Triage Vitals  Enc Vitals Group     BP 06/22/20 0936 111/72     Pulse Rate 06/22/20 0936 85     Resp 06/22/20 0936 20     Temp 06/22/20 0936 98.1 F (36.7 C)     Temp Source 06/22/20 0936 Oral     SpO2 06/22/20 0936 98 %     Weight 06/22/20 1003 195 lb (88.5 kg)     Height --      Head Circumference --      Peak Flow --      Pain Score 06/22/20 1002 5     Pain Loc --      Pain Edu? --      Excl. in GC? --  No data found.  Updated Vital Signs BP 111/72 (BP Location: Left Arm)   Pulse 85   Temp 98.1 F (36.7 C) (Oral)   Resp 20   Wt 195 lb (88.5 kg)   LMP 05/31/2020   SpO2 98%   BMI 32.45 kg/m   Visual Acuity Right Eye Distance:   Left Eye Distance:   Bilateral Distance:    Right Eye Near:   Left Eye Near:    Bilateral Near:     Physical Exam Constitutional:      Appearance: Normal appearance.  Cardiovascular:     Rate and Rhythm: Normal rate.  Pulmonary:     Effort: Pulmonary effort is normal.     Breath sounds: Normal breath sounds.  Musculoskeletal:       Feet:  Neurological:     Mental Status: She is alert.  Psychiatric:        Attention and Perception: Attention normal.        Mood and Affect: Mood normal.    .  UC Treatments / Results  Labs (all labs ordered are listed, but only abnormal results are displayed) Labs Reviewed - No data to display  EKG   Radiology No results found.  Procedures Procedures (including critical care time)  Medications Ordered in UC Medications - No data to display  Initial Impression / Assessment and Plan / UC Course  I have reviewed the  triage vital signs and the nursing notes.  Pertinent labs & imaging results that were available during my care of the patient were reviewed by me and considered in my medical decision making (see chart for details).    Imaging negative for right foot fracture. Suspect pain is related trauma sustained from large object impacting with foot. Long toe wrapped in gauze and Kerlix for comfort. 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 PCP if symptoms worsen or do not improve. Final Clinical Impressions(s) / UC Diagnoses   Final diagnoses:  Right foot injury, initial encounter     Discharge Instructions     Take ibuprofen 600 mg every 8 hours as needed for toe swelling and pain.  You may apply ice to reduce the swelling.  Recommend keeping toe wrapped to reduce swelling and prevent reinjury. Reviewed imaging no obvious fracture seen.      ED Prescriptions    Medication Sig Dispense Auth. Provider   ibuprofen (ADVIL) 600 MG tablet Take 1 tablet (600 mg total) by mouth every 8 (eight) hours as needed. 30 tablet Bing Neighbors, FNP     PDMP not reviewed this encounter.   Bing Neighbors, FNP 06/23/20 1421

## 2020-08-05 ENCOUNTER — Ambulatory Visit
Admission: EM | Admit: 2020-08-05 | Discharge: 2020-08-05 | Disposition: A | Discharge status: Home / Self Care | Payer: Medicaid Other | Attending: Emergency Medicine | Admitting: Emergency Medicine

## 2020-08-05 ENCOUNTER — Encounter: Payer: Self-pay | Admitting: *Deleted

## 2020-08-05 ENCOUNTER — Other Ambulatory Visit: Payer: Self-pay

## 2020-08-05 DIAGNOSIS — Z3201 Encounter for pregnancy test, result positive: Secondary | ICD-10-CM

## 2020-08-05 LAB — POCT URINE PREGNANCY: Preg Test, Ur: POSITIVE — AB

## 2020-08-05 MED ORDER — PRENATAL COMPLETE 14-0.4 MG PO TABS: 1.0000 | ORAL_TABLET | Freq: Every day | ORAL | 0 refills | Status: DC

## 2020-08-05 NOTE — ED Triage Notes (Signed)
Reports having positive pregnancy test at home; wishes to confirm.

## 2020-08-05 NOTE — Discharge Instructions (Signed)
Pregnancy test positive Follow up with OBGYN Begin prenatal

## 2020-08-05 NOTE — ED Provider Notes (Signed)
[name]    CSN: [id number] Arrival date & time: 08/05/20  0017      History   Chief Complaint Chief Complaint  Patient presents with  . Possible Pregnancy    HPI Debra Lucas is a 25 y.o. female presenting today for evaluation of a pregnancy test.  Took at home test which was positive.  Last menstrual cycle 06/30/2020.  Weight on cycle.  Denies any nausea vomiting, abdominal pain, urinary symptoms or vaginal symptoms.  Has had 2 prior pregnancies, had preeclampsia with the second pregnancy.  HPI  History reviewed. No pertinent past medical history.  Patient Active Problem List   Diagnosis Date Noted  . Women's annual routine gynecological examination 04/20/2020  . Bacterial vaginosis 07/05/2018  . History of group B Streptococcus (GBS) infection 07/16/2016    Past Surgical History:  Procedure Laterality Date  . ADENOIDECTOMY    . TONSILLECTOMY      OB History    Gravida  2   Para  2   Term  2   Preterm      AB      Living  2     SAB      IAB      Ectopic      Multiple  0   Live Births  2            Home Medications    Prior to Admission medications   Medication Sig Start Date End Date Taking? Authorizing Provider  Prenatal Vit-Fe Fumarate-FA (PRENATAL COMPLETE) 14-0.4 MG TABS Take 1 each by mouth daily. 08/05/20  Yes Ritta Hammes, Junius Creamer, PA-C    Family History Family History  Problem Relation Age of Onset  . Hypertension Maternal Grandmother   . Healthy Mother   . Healthy Father     Social History Social History   Tobacco Use  . Smoking status: Never Smoker  . Smokeless tobacco: Never Used  Vaping Use  . Vaping Use: Never used  Substance Use Topics  . Alcohol use: No  . Drug use: No     Allergies   Shellfish allergy   Review of Systems Review of Systems  Constitutional: Negative for fever.  Respiratory: Negative for shortness of breath.   Cardiovascular: Negative for chest pain.   Gastrointestinal: Negative for abdominal pain, diarrhea, nausea and vomiting.  Genitourinary: Negative for dysuria, flank pain, genital sores, hematuria, menstrual problem, vaginal bleeding, vaginal discharge and vaginal pain.  Musculoskeletal: Negative for back pain.  Skin: Negative for rash.  Neurological: Negative for dizziness, light-headedness and headaches.     Physical Exam Triage Vital Signs ED Triage Vitals  Enc Vitals Group     BP      Pulse      Resp      Temp      Temp src      SpO2      Weight      Height      Head Circumference      Peak Flow      Pain Score      Pain Loc      Pain Edu?      Excl. in GC?    No data found.  Updated Vital Signs BP 115/74   Pulse 86   Temp 98.5 F (36.9 C) (Oral)   Resp 16   LMP 06/30/2020 (Exact Date)   SpO2 98%   Visual Acuity Right Eye Distance:   Left Eye Distance:  Bilateral Distance:    Right Eye Near:   Left Eye Near:    Bilateral Near:     Physical Exam Vitals and nursing note reviewed.  Constitutional:      Appearance: She is well-developed.     Comments: No acute distress  HENT:     Head: Normocephalic and atraumatic.     Nose: Nose normal.  Eyes:     Conjunctiva/sclera: Conjunctivae normal.  Cardiovascular:     Rate and Rhythm: Normal rate.  Pulmonary:     Effort: Pulmonary effort is normal. No respiratory distress.  Abdominal:     General: There is no distension.  Musculoskeletal:        General: Normal range of motion.     Cervical back: Neck supple.  Skin:    General: Skin is warm and dry.  Neurological:     Mental Status: She is alert and oriented to person, place, and time.      UC Treatments / Results  Labs (all labs ordered are listed, but only abnormal results are displayed) Labs Reviewed  POCT URINE PREGNANCY - Abnormal; Notable for the following components:      Result Value   Preg Test, Ur Positive (*)    All other components within normal limits     EKG   Radiology No results found.  Procedures Procedures (including critical care time)  Medications Ordered in UC Medications - No data to display  Initial Impression / Assessment and Plan / UC Course  I have reviewed the triage vital signs and the nursing notes.  Pertinent labs & imaging results that were available during my care of the patient were reviewed by me and considered in my medical decision making (see chart for details).     Positive pregnancy test, initiated on prenatal and will follow up with OB/GYN.  Currently asymptomatic.  Discussed strict return precautions. Patient verbalized understanding and is agreeable with plan.  Final Clinical Impressions(s) / UC Diagnoses   Final diagnoses:  Positive pregnancy test     Discharge Instructions     Pregnancy test positive Follow up with OBGYN Begin prenatal    ED Prescriptions    Medication Sig Dispense Auth. Provider   Prenatal Vit-Fe Fumarate-FA (PRENATAL COMPLETE) 14-0.4 MG TABS Take 1 each by mouth daily. 60 tablet Ayane Delancey, Leonardo C, PA-C     PDMP not reviewed this encounter.   Lew Dawes, [place] 08/05/20 [phone]

## 2020-08-23 ENCOUNTER — Other Ambulatory Visit: Payer: Self-pay

## 2020-08-23 ENCOUNTER — Inpatient Hospital Stay (HOSPITAL_COMMUNITY)
Admission: AD | Admit: 2020-08-23 | Discharge: 2020-08-23 | Disposition: A | Discharge status: Home / Self Care | Payer: Medicaid Other | Attending: Obstetrics & Gynecology | Admitting: Obstetrics & Gynecology

## 2020-08-23 ENCOUNTER — Encounter (HOSPITAL_COMMUNITY): Payer: Self-pay | Admitting: Obstetrics & Gynecology

## 2020-08-23 ENCOUNTER — Inpatient Hospital Stay (HOSPITAL_COMMUNITY): Payer: Medicaid Other

## 2020-08-23 DIAGNOSIS — O209 Hemorrhage in early pregnancy, unspecified: Secondary | ICD-10-CM

## 2020-08-23 DIAGNOSIS — O208 Other hemorrhage in early pregnancy: Secondary | ICD-10-CM | POA: Diagnosis not present

## 2020-08-23 DIAGNOSIS — O3481 Maternal care for other abnormalities of pelvic organs, first trimester: Secondary | ICD-10-CM | POA: Diagnosis not present

## 2020-08-23 DIAGNOSIS — O418X10 Other specified disorders of amniotic fluid and membranes, first trimester, not applicable or unspecified: Secondary | ICD-10-CM

## 2020-08-23 DIAGNOSIS — N8311 Corpus luteum cyst of right ovary: Secondary | ICD-10-CM | POA: Diagnosis not present

## 2020-08-23 DIAGNOSIS — Z3491 Encounter for supervision of normal pregnancy, unspecified, first trimester: Secondary | ICD-10-CM

## 2020-08-23 DIAGNOSIS — Z3A01 Less than 8 weeks gestation of pregnancy: Secondary | ICD-10-CM

## 2020-08-23 DIAGNOSIS — O468X1 Other antepartum hemorrhage, first trimester: Secondary | ICD-10-CM

## 2020-08-23 LAB — CBC
HCT: 39.1 % (ref 36.0–46.0)
Hemoglobin: 12.3 g/dL (ref 12.0–15.0)
MCH: 23.3 pg — ABNORMAL LOW (ref 26.0–34.0)
MCHC: 31.5 g/dL (ref 30.0–36.0)
MCV: 73.9 fL — ABNORMAL LOW (ref 80.0–100.0)
Platelets: 184 10*3/uL (ref 150–400)
RBC: 5.29 MIL/uL — ABNORMAL HIGH (ref 3.87–5.11)
RDW: 16 % — ABNORMAL HIGH (ref 11.5–15.5)
WBC: 6.8 10*3/uL (ref 4.0–10.5)
nRBC: 0 % (ref 0.0–0.2)

## 2020-08-23 LAB — HCG, QUANTITATIVE, PREGNANCY: hCG, Beta Chain, Quant, S: 114874 m[IU]/mL — ABNORMAL HIGH (ref <5)

## 2020-08-23 LAB — WET PREP, GENITAL
Clue Cells Wet Prep HPF POC: NONE SEEN
Sperm: NONE SEEN
Trich, Wet Prep: NONE SEEN
Yeast Wet Prep HPF POC: NONE SEEN

## 2020-08-23 NOTE — MAU Note (Signed)
Presents with c/o spotting dark brown blood x2 days.  Reports noted VB in panties yesterday and again this morning.  Reports last intercourse was 2 days ago.  LMP 07/01/2019.

## 2020-08-23 NOTE — Discharge Instructions (Signed)
Subchorionic Hematoma  A hematoma is a collection of blood outside of the blood vessels. A subchorionic hematoma is a collection of blood between the outer wall of the embryo (chorion) and the inner wall of the uterus. This condition can cause vaginal bleeding. Early small hematomas usually shrink on their own and do not affect your baby or pregnancy. When bleeding starts later in pregnancy, or if the hematoma is larger or occurs in older pregnant women, the condition may be more serious. Larger hematomas increase the chances of miscarriage. This condition also increases the risk of:  Premature separation of the placenta from the uterus.  Premature (preterm) labor.  Stillbirth. What are the causes? The exact cause of this condition is not known. It occurs when blood is trapped between the placenta and the uterine wall because the placenta has separated from the original site of implantation. What increases the risk? You are more likely to develop this condition if:  You were treated with fertility medicines.  You became pregnant through in vitro fertilization (IVF). What are the signs or symptoms? Symptoms of this condition include:  Vaginal spotting or bleeding.  Abdominal pain. This is rare. Sometimes you may have no symptoms and the bleeding may only be seen when ultrasound images are taken (transvaginal ultrasound). How is this diagnosed? This condition is diagnosed based on a physical exam. This includes a pelvic exam. You may also have other tests, including:  Blood tests.  Urine tests.  Ultrasound of the abdomen. How is this treated? Treatment for this condition can vary. Treatment may include:  Watchful waiting. You will be monitored closely for any changes in bleeding.  Medicines.  Activity restriction. This may be needed until the bleeding stops.  A medicine called Rh immunoglobulin. This is given if you have an Rh-negative blood type. It prevents Rh  sensitization. Follow these instructions at home:  Stay on bed rest if told to do so by your health care provider.  Do not lift anything that is heavier than 10 lb (4.5 kg), or the limit that you are told by your health care provider.  Track and write down the number of pads you use each day and how soaked (saturated) they are.  Do not use tampons.  Keep all follow-up visits. This is important. Your health care provider may ask you to have follow-up blood tests or ultrasound tests or both. Contact a health care provider if:  You have any vaginal bleeding.  You have a fever. Get help right away if:  You have severe cramps in your stomach, back, abdomen, or pelvis.  You pass large clots or tissue. Save any tissue for your health care provider to look at.  You faint.  You become light-headed or weak. Summary  A subchorionic hematoma is a collection of blood between the outer wall of the embryo (chorion) and the inner wall of the uterus.  This condition can cause vaginal bleeding.  Sometimes you may have no symptoms and the bleeding may only be seen when ultrasound images are taken.  Treatment may include watchful waiting, medicines, or activity restriction.  Keep all follow-up visits. Get help right away if you have severe cramps or heavy vaginal bleeding. This information is not intended to replace advice given to you by your health care provider. Make sure you discuss any questions you have with your health care provider. Document Revised: 01/31/2020 Document Reviewed: 01/31/2020 Elsevier Patient Education  2021 [employer]vier Inc.   

## 2020-08-23 NOTE — MAU Provider Note (Signed)
History     CSN: [id number]  Arrival date and time: 08/23/20 0744   Event Date/Time   First Provider Initiated Contact with Patient 08/23/20 [phone]      Chief Complaint  Patient presents with  . Spotting   HPI Debra Lucas is a 25 y.o. G3P2002 at [redacted]w[redacted]d who presents with vaginal bleeding. Reports spotting started 2 days ago after intercourse. Has been brown spotting in her underwear. Not bleeding into a pad or passing blood clots. Reports intermittent abdominal discomfort throughout her lower abdomen that feels like gas pain. Rates pain 0/10 currently. Denies n/v/d, dysuria, or vaginal discharge.   OB History    Gravida  3   Para  2   Term  2   Preterm      AB      Living  2     SAB      IAB      Ectopic      Multiple  0   Live Births  2           History reviewed. No pertinent past medical history.  Past Surgical History:  Procedure Laterality Date  . ADENOIDECTOMY    . TONSILLECTOMY      Family History  Problem Relation Age of Onset  . Hypertension Maternal Grandmother   . Healthy Mother   . Healthy Father     Social History   Tobacco Use  . Smoking status: Never Smoker  . Smokeless tobacco: Never Used  Vaping Use  . Vaping Use: Never used  Substance Use Topics  . Alcohol use: No  . Drug use: No    Allergies:  Allergies  Allergen Reactions  . Shellfish Allergy Swelling and Rash    Medications Prior to Admission  Medication Sig Dispense Refill Last Dose  . Prenatal Vit-Fe Fumarate-FA (PRENATAL COMPLETE) 14-0.4 MG TABS Take 1 each by mouth daily. 60 tablet 0 08/23/2020 at Unknown time    Review of Systems  Constitutional: Negative.   Gastrointestinal: Positive for abdominal pain. Negative for constipation, diarrhea, nausea and vomiting.  Genitourinary: Positive for vaginal bleeding. Negative for dysuria and vaginal discharge.   Physical Exam   Blood pressure 124/72, pulse 97, temperature 98.1 F (36.7 C), temperature source  Oral, resp. rate 18, height 5\' 5"  (1.651 m), weight 91.6 kg, last menstrual period 06/30/2020, SpO2 97 %.  Physical Exam Vitals and nursing note reviewed. Exam conducted with a chaperone present.  Constitutional:      General: She is not in acute distress.    Appearance: Normal appearance.  HENT:     Head: Normocephalic and atraumatic.  Pulmonary:     Effort: Pulmonary effort is normal. No respiratory distress.  Abdominal:     General: There is no distension.     Tenderness: There is no abdominal tenderness. There is no guarding.  Genitourinary:    General: Normal vulva.     Exam position: Lithotomy position.     Uterus: Normal.      Comments: No blood. Small amount of tan discharge. Cervix closed.  Skin:    General: Skin is warm and dry.  Neurological:     Mental Status: She is alert.  Psychiatric:        Mood and Affect: Mood normal.        Behavior: Behavior normal.     MAU Course  Procedures Results for orders placed or performed during the hospital encounter of 08/23/20 (from the past 24 hour(s))  CBC  Status: Abnormal   Collection Time: 08/23/20  8:45 AM  Result Value Ref Range   WBC 6.8 4.0 - 10.5 K/uL   RBC 5.29 (H) 3.87 - 5.11 MIL/uL   Hemoglobin 12.3 12.0 - 15.0 g/dL   HCT 01.0 93.2 - 35.5 %   MCV 73.9 (L) 80.0 - 100.0 fL   MCH 23.3 (L) 26.0 - 34.0 pg   MCHC 31.5 30.0 - 36.0 g/dL   RDW 73.2 (H) 20.2 - 54.2 %   Platelets 184 150 - 400 K/uL   nRBC 0.0 0.0 - 0.2 %  Wet prep, genital     Status: Abnormal   Collection Time: 08/23/20  8:53 AM  Result Value Ref Range   Yeast Wet Prep HPF POC NONE SEEN NONE SEEN   Trich, Wet Prep NONE SEEN NONE SEEN   Clue Cells Wet Prep HPF POC NONE SEEN NONE SEEN   WBC, Wet Prep HPF POC MANY (A) NONE SEEN   Sperm NONE SEEN    US OB Comp Less 14 Wks  Result Date: 08/23/2020 CLINICAL DATA:  A 25 year old female with bleeding and spotting for 2 days. Quantitative beta HCG is in process. Last menstrual period with gestational  age of [redacted] weeks 5 days, last menstrual period on 06/30/2020. EXAM: OBSTETRIC <14 WK [place] AND TRANSVAGINAL OB US TECHNIQUE: Both transabdominal and transvaginal ultrasound examinations were performed for complete evaluation of the gestation as well as the maternal uterus, adnexal regions, and pelvic cul-de-sac. Transvaginal technique was performed to assess early pregnancy. COMPARISON:  September 02, 2016 FINDINGS: Intrauterine gestational sac: Single Yolk sac:  Visualized Embryo:  Visualized Cardiac Activity: Visualized Heart Rate: 150 bpm CRL: 12.2 mm   7 w   3 d                  [place] EDC: 04/08/2021 Subchorionic hemorrhage:  Small Maternal uterus/adnexae: Uterus and adnexa unremarkable. Corpus luteum cyst in the RIGHT ovary. No free fluid. IMPRESSION: Single intrauterine pregnancy, 7 weeks and 3 days by crown-rump length, with a fetal heart rate of 150 beats per minute and small subchorionic hemorrhage. Electronically Signed   By: Donzetta Kohut M.D.   On: 08/23/2020 09:29    MDM +UPT UA, wet prep, GC/chlamydia, CBC, quant hCG, and [place] today to rule out ectopic pregnancy which can be life threatening.   RH positive  No blood noted on exam. Ultrasound today shows live IUP, dating c/w LMP, small subchorionic hemorrhage.   Wet prep negative Assessment and Plan   1. Subchorionic hematoma in first trimester, single or unspecified fetus   2. Vaginal bleeding in pregnancy, first trimester   3. Normal IUP (intrauterine pregnancy) on prenatal ultrasound, first trimester   4. [redacted] weeks gestation of pregnancy    -scheduled to start prenatal care -discussed bleeding precautions & reasons to return to MAU -GC/CT pending  Judeth Horn 08/23/2020, 9:40 AM

## 2020-08-24 LAB — GC/CHLAMYDIA PROBE AMP (~~LOC~~) NOT AT ARMC
Chlamydia: NEGATIVE
Comment: NEGATIVE
Comment: NORMAL
Neisseria Gonorrhea: NEGATIVE

## 2020-09-08 ENCOUNTER — Ambulatory Visit (INDEPENDENT_AMBULATORY_CARE_PROVIDER_SITE_OTHER): Payer: Medicaid Other

## 2020-09-08 ENCOUNTER — Other Ambulatory Visit: Payer: Self-pay

## 2020-09-08 VITALS — BP 114/72 | HR 82 | Ht 65.0 in | Wt 195.1 lb

## 2020-09-08 DIAGNOSIS — Z3491 Encounter for supervision of normal pregnancy, unspecified, first trimester: Secondary | ICD-10-CM | POA: Insufficient documentation

## 2020-09-08 DIAGNOSIS — Z3481 Encounter for supervision of other normal pregnancy, first trimester: Secondary | ICD-10-CM

## 2020-09-08 MED ORDER — BLOOD PRESSURE KIT DEVI: 1.0000 | 0 refills | Status: DC

## 2020-09-08 NOTE — Progress Notes (Signed)
PRENATAL INTAKE SUMMARY  [name] presents today New OB Nurse Interview.  OB History    Gravida  3   Para  2   Term  2   Preterm      AB      Living  2     SAB      IAB      Ectopic      Multiple  0   Live Births  2          I have reviewed the patient's medical, obstetrical, social, and family histories, medications, and available lab results.  SUBJECTIVE She has no unusual complaints  OBJECTIVE Initial Physical Exam (New OB)  GENERAL APPEARANCE: alert, well appearing   ASSESSMENT Normal pregnancy  PLAN Prenatal care to be completed at [place] OB labs to be completed at [facility] Provider visit Baby Scripts ordered Blood pressure kit ordered U/S not performed due to patient having one done in MAU. PHQ9 score: 1 GAD 7 score: 0

## 2020-09-08 NOTE — Progress Notes (Signed)
Patient was assessed and managed by nursing staff during this encounter. I have reviewed the chart and agree with the documentation and plan. I have also made any necessary editorial changes.  Toniya Rozar, MD 09/08/2020 11:39 AM 

## 2020-09-14 ENCOUNTER — Other Ambulatory Visit: Payer: Self-pay

## 2020-09-14 ENCOUNTER — Other Ambulatory Visit (HOSPITAL_COMMUNITY)
Admission: RE | Admit: 2020-09-14 | Discharge: 2020-09-14 | Disposition: A | Discharge status: Home / Self Care | Payer: Medicaid Other | Source: Ambulatory Visit

## 2020-09-14 ENCOUNTER — Ambulatory Visit (INDEPENDENT_AMBULATORY_CARE_PROVIDER_SITE_OTHER): Payer: Medicaid Other

## 2020-09-14 VITALS — BP 118/73 | HR 90 | Wt 199.0 lb

## 2020-09-14 DIAGNOSIS — R110 Nausea: Secondary | ICD-10-CM

## 2020-09-14 DIAGNOSIS — Z3A10 10 weeks gestation of pregnancy: Secondary | ICD-10-CM | POA: Diagnosis not present

## 2020-09-14 DIAGNOSIS — O09299 Supervision of pregnancy with other poor reproductive or obstetric history, unspecified trimester: Secondary | ICD-10-CM

## 2020-09-14 DIAGNOSIS — Z3481 Encounter for supervision of other normal pregnancy, first trimester: Secondary | ICD-10-CM

## 2020-09-14 MED ORDER — DOXYLAMINE-PYRIDOXINE 10-10 MG PO TBEC: 2.0000 | DELAYED_RELEASE_TABLET | Freq: Every day | ORAL | 2 refills | Status: DC

## 2020-09-14 MED ORDER — ASPIRIN EC 81 MG PO TBEC: 81.0000 mg | DELAYED_RELEASE_TABLET | Freq: Every day | ORAL | 2 refills | Status: DC

## 2020-09-14 NOTE — Patient Instructions (Signed)

## 2020-09-14 NOTE — Progress Notes (Signed)
Subjective:   Debra Lucas is a 25 y.o. G3P2002 at 77w6dby  Definite LMP of Jun 30, 2020 being seen today for her first obstetrical visit.  She states this was an unplanned pregnancy, but "we are blessed."  She was not on any birth control methods prior to conception.   Gynecological/Obstetrical History: Her obstetrical history is significant for pre-eclampsia. Patient does intend to breast feed. Pregnancy history fully reviewed. Patient reports nausea. Sexual Activity and Vaginal Concerns  Medical History/ROS: Patient denies history of cardiovascular, respiratory, GI, or hematological disorders.  She denies issues with urination, constipation, or diarrhea.  She endorses nausea.  She reports it occurs daily, throughout the day.  She denies vomiting.     Social History: Patient denies history or current usage of tobacco, alcohol, or drugs.  Patient reports the FOB is CFabio Asawho is involved, supportive, and present.  Patient reports that she lives with daughters and endorses safety at home.  Patient denies DV/A. 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 Hx of pre-eclampsia in prior pregnancy, currently pregnant 2017   Past Surgical History:  Procedure Laterality Date  . ADENOIDECTOMY    . TONSILLECTOMY     Family History  Problem Relation Age of Onset  . Hypertension  Maternal Grandmother   . Healthy Mother   . Healthy Father    Social History   Tobacco Use  . Smoking status: Never Smoker  . Smokeless tobacco: Never Used  Vaping Use  . Vaping Use: Never used  Substance Use Topics  . Alcohol use: No  . Drug use: No   Allergies  Allergen Reactions  . Shellfish Allergy Swelling and Rash   Current Outpatient Medications on File Prior to Visit  Medication Sig Dispense Refill  . Blood Pressure Monitoring (BLOOD PRESSURE KIT) DEVI 1 kit by Does not apply route once a week. 1 each 0  . Prenatal Vit-Fe Fumarate-FA (PRENATAL COMPLETE) 14-0.4 MG TABS Take 1 each by mouth daily. 60 tablet 0   No current facility-administered medications on file prior to visit.    Review of Systems Pertinent items noted in HPI and remainder of comprehensive ROS otherwise negative.  Exam   Vitals:   09/14/20 1311  BP: 118/73  Pulse: 90  Weight: 199 lb (90.3 kg)   Fetal Heart Rate (bpm): 169  Physical Exam Constitutional:      General: She is not in acute distress.    Appearance: Normal appearance.  Genitourinary:     Right Labia: No tenderness or lesions.    Left Labia: No tenderness or lesions.  Vaginal exam comments: Speculum exam deferred. No discharge noted at introitus CV collected blindly BME without CMT..     No cervical motion tenderness.     Uterus exam comments: Unable to assess uterine size and tenderness d/t body habitus and uterine position. Marland Kitchen  HENT:     Head: Normocephalic and atraumatic.  Eyes:     Conjunctiva/sclera: Conjunctivae normal.  Cardiovascular:     Rate and Rhythm: Normal rate and regular rhythm.     Heart sounds: Normal heart sounds.  Pulmonary:     Effort: Pulmonary effort is normal. No respiratory distress.     Breath sounds: Normal breath sounds.  Abdominal:     General: Bowel sounds are normal. There is no distension.     Tenderness: There is no abdominal tenderness.  Musculoskeletal:        General: Normal  range of motion.     Cervical back: Normal range of motion.     Right lower leg: No edema.     Left lower leg: No edema.  Neurological:     Mental Status: She is alert and oriented to person, place, and time.  Skin:    General: Skin is warm and dry.  Psychiatric:        Mood and Affect: Mood normal.        Behavior: Behavior normal.        Thought Content: Thought content normal.  Vitals reviewed. Exam conducted with a chaperone present.     Assessment:   25 y.o. year old G36P2002 Patient Active Problem List   Diagnosis Date Noted  . Encounter for supervision of normal pregnancy in first trimester 09/08/2020  . History of group B Streptococcus (GBS) infection 07/16/2016     Plan:  1. Encounter for supervision of other normal pregnancy in first trimester -Congratulations given and patient welcomed to practice. -Discussed usage of Babyscripts and virtual visits as additional source of managing and completing PN visits in midst of coronavirus.   *Instructed to take blood pressure and record weekly into babyscripts. *Reviewed modified prenatal visit schedule and platforms used for virtual visits.  -Anticipatory guidance for prenatal visits including labs, ultrasounds, and testing; Initial labs drawn. -Genetic Screening discussed, NIPS: ordered. -Encouraged to complete MyChart Registration for her ability to review results, send requests, and have questions addressed.  -Discussed estimated due date of November 18th, 2022. -Ultrasound discussed; fetal anatomic survey: ordered. -Initiate prenatal vitamins; Rx sent to pharmacy on file.  -Influenza shot UTD, Covid Vaccine UTD x 2. -Encouraged to seek out care at office or emergency room for urgent and/or emergent concerns. -Educated on the nature of [facility] with multiple MDs and other Advanced Practice Providers was explained to patient; also emphasized that residents, students are part of our  team. Informed of her right to refuse care as she deems appropriate.  -No questions or concerns.   2. Nausea -Discussed management of symptoms. -Rx for Diclegis sent to pharmacy on file.   3. [redacted] weeks gestation of pregnancy -Doing well overall. -Plan to return to office in 6 weeks.  4. History of pre-eclampsia in prior pregnancy, currently pregnant -Discussed reports of history. -Reviewed baby ASA initiation d/t history, BMI, and AA heritage increasing risk for PreE in pregnancy. -Informed that dosing should start after 12 weeks. -Rx sent to pharmacy on file for start date of May 8th.  Problem list reviewed and updated. Routine obstetric precautions reviewed.  Orders Placed This Encounter  Procedures  .  Culture, OB Urine  . [place] MFM OB DETAIL +14 WK    Standing Status:   Future    Standing Expiration Date:   09/14/2021    Order Specific Question:   Reason for Exam (SYMPTOM  OR DIAGNOSIS REQUIRED)    Answer:   H/O PreEclampsia, Anatomy Scan, BMI 33    Order Specific Question:   Preferred Location    Answer:   [facility] Ultrasound  . Genetic Screening  . CBC/D/Plt+RPR+Rh+ABO+Rub Ab...    Return in about 6 weeks (around 10/26/2020) for LROB with AFP.     Maryann Conners, CNM 09/14/2020 1:31 PM

## 2020-09-14 NOTE — Progress Notes (Signed)
NOB c/o Morning sickness, congestion, red rashes on both arms.

## 2020-09-15 LAB — CBC/D/PLT+RPR+RH+ABO+RUB AB...
Antibody Screen: NEGATIVE
Basophils Absolute: 0 10*3/uL (ref 0.0–0.2)
Basos: 0 %
EOS (ABSOLUTE): 0.3 10*3/uL (ref 0.0–0.4)
Eos: 5 %
HCV Ab: <0.1 s/co ratio (ref 0.0–0.9)
HIV Screen 4th Generation wRfx: NONREACTIVE
Hematocrit: 38.5 % (ref 34.0–46.6)
Hemoglobin: 12.2 g/dL (ref 11.1–15.9)
Hepatitis B Surface Ag: NEGATIVE
Immature Grans (Abs): 0 10*3/uL (ref 0.0–0.1)
Immature Granulocytes: 0 %
Lymphocytes Absolute: 1.6 10*3/uL (ref 0.7–3.1)
Lymphs: 24 %
MCH: 22.7 pg — ABNORMAL LOW (ref 26.6–33.0)
MCHC: 31.7 g/dL (ref 31.5–35.7)
MCV: 72 fL — ABNORMAL LOW (ref 79–97)
Monocytes Absolute: 0.5 10*3/uL (ref 0.1–0.9)
Monocytes: 8 %
Neutrophils Absolute: 4.3 10*3/uL (ref 1.4–7.0)
Neutrophils: 63 %
Platelets: 190 10*3/uL (ref 150–450)
RBC: 5.38 x10E6/uL — ABNORMAL HIGH (ref 3.77–5.28)
RDW: 15 % (ref 11.7–15.4)
RPR Ser Ql: NONREACTIVE
Rh Factor: POSITIVE
Rubella Antibodies, IGG: 1.89 index (ref >0.99)
WBC: 6.8 10*3/uL (ref 3.4–10.8)

## 2020-09-15 LAB — CERVICOVAGINAL ANCILLARY ONLY
Chlamydia: NEGATIVE
Comment: NEGATIVE
Comment: NEGATIVE
Comment: NORMAL
Neisseria Gonorrhea: NEGATIVE
Trichomonas: NEGATIVE

## 2020-09-15 LAB — HCV INTERPRETATION

## 2020-09-16 LAB — CULTURE, OB URINE

## 2020-09-16 LAB — URINE CULTURE, OB REFLEX

## 2020-10-26 ENCOUNTER — Ambulatory Visit (INDEPENDENT_AMBULATORY_CARE_PROVIDER_SITE_OTHER): Payer: Medicaid Other | Admitting: Obstetrics

## 2020-10-26 ENCOUNTER — Other Ambulatory Visit: Payer: Self-pay

## 2020-10-26 ENCOUNTER — Encounter: Payer: Self-pay | Admitting: Obstetrics

## 2020-10-26 VITALS — BP 131/77 | HR 90 | Wt 197.0 lb

## 2020-10-26 DIAGNOSIS — Z3480 Encounter for supervision of other normal pregnancy, unspecified trimester: Secondary | ICD-10-CM

## 2020-10-26 DIAGNOSIS — R110 Nausea: Secondary | ICD-10-CM

## 2020-10-26 DIAGNOSIS — M549 Dorsalgia, unspecified: Secondary | ICD-10-CM

## 2020-10-26 DIAGNOSIS — O26899 Other specified pregnancy related conditions, unspecified trimester: Secondary | ICD-10-CM

## 2020-10-26 DIAGNOSIS — R12 Heartburn: Secondary | ICD-10-CM

## 2020-10-26 MED ORDER — FAMOTIDINE 20 MG PO TABS: 20.0000 mg | ORAL_TABLET | Freq: Two times a day (BID) | ORAL | 5 refills | Status: DC

## 2020-10-26 NOTE — Progress Notes (Signed)
Subjective:  Debra Lucas is a 25 y.o. G3P2002 at [redacted]w[redacted]d being seen today for ongoing prenatal care.  She is currently monitored for the following issues for this low-risk pregnancy and has History of group B Streptococcus (GBS) infection and Encounter for supervision of normal pregnancy in first trimester on their problem list.  Patient reports backache, heartburn, and mild nausea .  Contractions: Not present. Vag. Bleeding: None.   . Denies leaking of fluid.   The following portions of the patient's history were reviewed and updated as appropriate: allergies, current medications, past family history, past medical history, past social history, past surgical history and problem list. Problem list updated.  Objective:   Vitals:   10/26/20 1509  BP: 131/77  Pulse: 90  Weight: 197 lb (89.4 kg)    Fetal Status: Fetal Heart Rate (bpm): 157         General:  Alert, oriented and cooperative. Patient is in no acute distress.  Skin: Skin is warm and dry. No rash noted.   Cardiovascular: Normal heart rate noted  Respiratory: Normal respiratory effort, no problems with respiration noted  Abdomen: Soft, gravid, appropriate for gestational age. Pain/Pressure: Absent     Pelvic:  Cervical exam deferred        Extremities: Normal range of motion.  Edema: Trace  Mental Status: Normal mood and affect. Normal behavior. Normal judgment and thought content.   Urinalysis:      Assessment and Plan:  Pregnancy: G3P2002 at [redacted]w[redacted]d  There are no diagnoses linked to this encounter. Preterm labor symptoms and general obstetric precautions including but not limited to vaginal bleeding, contractions, leaking of fluid and fetal movement were reviewed in detail with the patient. Please refer to After Visit Summary for other counseling recommendations.   Return in about 4 weeks (around 11/23/2020) for ROB.    Brock Bad, MD 10/26/2020 3:27 PM

## 2020-10-26 NOTE — Progress Notes (Signed)
OB, c/o SOB, breathing heavy and rash all over her body.

## 2020-10-28 LAB — AFP, SERUM, OPEN SPINA BIFIDA
AFP MoM: 1.12
AFP Value: 36.2 ng/mL
Gest. Age on Collection Date: 16 weeks
Maternal Age At EDD: 25 yr
OSBR Risk 1 IN: 10000
Test Results:: NEGATIVE
Weight: 197 [lb_av]

## 2020-11-10 ENCOUNTER — Ambulatory Visit: Payer: Medicaid Other

## 2020-11-10 ENCOUNTER — Encounter: Payer: Self-pay | Admitting: *Deleted

## 2020-11-10 ENCOUNTER — Other Ambulatory Visit: Payer: Self-pay

## 2020-11-10 ENCOUNTER — Ambulatory Visit: Payer: Medicaid Other | Admitting: *Deleted

## 2020-11-10 VITALS — BP 123/66 | HR 92

## 2020-11-10 DIAGNOSIS — Z3481 Encounter for supervision of other normal pregnancy, first trimester: Secondary | ICD-10-CM

## 2020-11-10 DIAGNOSIS — Z6832 Body mass index (BMI) 32.0-32.9, adult: Secondary | ICD-10-CM | POA: Insufficient documentation

## 2020-11-10 DIAGNOSIS — Z3A10 10 weeks gestation of pregnancy: Secondary | ICD-10-CM | POA: Diagnosis not present

## 2020-11-13 ENCOUNTER — Other Ambulatory Visit: Payer: Self-pay | Admitting: *Deleted

## 2020-11-13 DIAGNOSIS — Z362 Encounter for other antenatal screening follow-up: Secondary | ICD-10-CM

## 2020-11-23 ENCOUNTER — Other Ambulatory Visit: Payer: Self-pay

## 2020-11-23 ENCOUNTER — Ambulatory Visit (INDEPENDENT_AMBULATORY_CARE_PROVIDER_SITE_OTHER): Payer: Medicaid Other | Admitting: Advanced Practice Midwife

## 2020-11-23 VITALS — BP 115/74 | HR 92 | Wt 195.8 lb

## 2020-11-23 DIAGNOSIS — Z3480 Encounter for supervision of other normal pregnancy, unspecified trimester: Secondary | ICD-10-CM

## 2020-11-23 DIAGNOSIS — Z3A20 20 weeks gestation of pregnancy: Secondary | ICD-10-CM

## 2020-11-23 NOTE — Progress Notes (Signed)
   PRENATAL VISIT NOTE  Subjective:  Debra Lucas is a 25 y.o. G3P2002 at [redacted]w[redacted]d being seen today for ongoing prenatal care.  She is currently monitored for the following issues for this low-risk pregnancy and has History of group B Streptococcus (GBS) infection and Encounter for supervision of normal pregnancy in first trimester on their problem list.  Patient reports no complaints.  Contractions: Not present. Vag. Bleeding: None.  Movement: Present. Denies leaking of fluid.   The following portions of the patient's history were reviewed and updated as appropriate: allergies, current medications, past family history, past medical history, past social history, past surgical history and problem list.   Objective:   Vitals:   11/23/20 1516  BP: 115/74  Pulse: 92  Weight: 195 lb 12.8 oz (88.8 kg)    Fetal Status: Fetal Heart Rate (bpm): 152   Movement: Present     General:  Alert, oriented and cooperative. Patient is in no acute distress.  Skin: Skin is warm and dry. No rash noted.   Cardiovascular: Normal heart rate noted  Respiratory: Normal respiratory effort, no problems with respiration noted  Abdomen: Soft, gravid, appropriate for gestational age.  Pain/Pressure: Absent     Pelvic: Cervical exam deferred        Extremities: Normal range of motion.  Edema: None  Mental Status: Normal mood and affect. Normal behavior. Normal judgment and thought content.   Assessment and Plan:  Pregnancy: G3P2002 at [redacted]w[redacted]d 1. Supervision of other normal pregnancy, antepartum --Anticipatory guidance about next visits/weeks of pregnancy given. --Reviewed normal anatomy US results --Next visit in 4 weeks  2. [redacted] weeks gestation of pregnancy   Preterm labor symptoms and general obstetric precautions including but not limited to vaginal bleeding, contractions, leaking of fluid and fetal movement were reviewed in detail with the patient. Please refer to After Visit Summary for other counseling  recommendations.   Return in about 4 weeks (around 12/21/2020).  Future Appointments  Date Time Provider Department Center  12/15/2020  3:15 PM [facility] NURSE [facility]  12/15/2020  3:30 PM [facility] US3 [facility]  12/21/2020 10:35 AM Brock Bad, MD [facility] None    Sharen Counter, CNM

## 2020-12-15 ENCOUNTER — Other Ambulatory Visit: Payer: Self-pay

## 2020-12-15 ENCOUNTER — Ambulatory Visit: Payer: Medicaid Other | Attending: Maternal & Fetal Medicine

## 2020-12-15 ENCOUNTER — Ambulatory Visit: Payer: Medicaid Other | Admitting: *Deleted

## 2020-12-15 ENCOUNTER — Other Ambulatory Visit: Payer: Self-pay | Admitting: *Deleted

## 2020-12-15 VITALS — BP 120/73 | HR 97

## 2020-12-15 DIAGNOSIS — Z3481 Encounter for supervision of other normal pregnancy, first trimester: Secondary | ICD-10-CM | POA: Diagnosis present

## 2020-12-15 DIAGNOSIS — O409XX0 Polyhydramnios, unspecified trimester, not applicable or unspecified: Secondary | ICD-10-CM

## 2020-12-15 DIAGNOSIS — Z362 Encounter for other antenatal screening follow-up: Secondary | ICD-10-CM | POA: Diagnosis present

## 2020-12-21 ENCOUNTER — Ambulatory Visit (INDEPENDENT_AMBULATORY_CARE_PROVIDER_SITE_OTHER): Payer: Medicaid Other | Admitting: Obstetrics

## 2020-12-21 ENCOUNTER — Encounter: Payer: Self-pay | Admitting: Obstetrics

## 2020-12-21 ENCOUNTER — Other Ambulatory Visit: Payer: Self-pay

## 2020-12-21 VITALS — BP 118/77 | HR 96 | Wt 204.9 lb

## 2020-12-21 DIAGNOSIS — Z3480 Encounter for supervision of other normal pregnancy, unspecified trimester: Secondary | ICD-10-CM

## 2020-12-21 NOTE — Progress Notes (Signed)
Subjective:  Debra Lucas is a 25 y.o. G3P2002 at [redacted]w[redacted]d being seen today for ongoing prenatal care.  She is currently monitored for the following issues for this low-risk pregnancy and has History of group B Streptococcus (GBS) infection and Encounter for supervision of normal pregnancy in first trimester on their problem list.  Patient reports no complaints.  Contractions: Not present. Vag. Bleeding: None.  Movement: Present. Denies leaking of fluid.   The following portions of the patient's history were reviewed and updated as appropriate: allergies, current medications, past family history, past medical history, past social history, past surgical history and problem list. Problem list updated.  Objective:   Vitals:   12/21/20 1035  BP: 118/77  Pulse: 96  Weight: 204 lb 14.4 oz (92.9 kg)    Fetal Status:     Movement: Present     General:  Alert, oriented and cooperative. Patient is in no acute distress.  Skin: Skin is warm and dry. No rash noted.   Cardiovascular: Normal heart rate noted  Respiratory: Normal respiratory effort, no problems with respiration noted  Abdomen: Soft, gravid, appropriate for gestational age. Pain/Pressure: Absent     Pelvic:  Cervical exam deferred        Extremities: Normal range of motion.  Edema: Trace  Mental Status: Normal mood and affect. Normal behavior. Normal judgment and thought content.   Urinalysis:      Assessment and Plan:  Pregnancy: G3P2002 at [redacted]w[redacted]d  1. Supervision of other normal pregnancy, antepartum    Preterm labor symptoms and general obstetric precautions including but not limited to vaginal bleeding, contractions, leaking of fluid and fetal movement were reviewed in detail with the patient. Please refer to After Visit Summary for other counseling recommendations.   Return in about 4 weeks (around 01/18/2021) for ROB, 2 hour OGTT.   Brock Bad, MD  12/21/20

## 2020-12-21 NOTE — Progress Notes (Signed)
Pt reports fetal movement, denies pain.  

## 2021-01-12 ENCOUNTER — Other Ambulatory Visit: Payer: Self-pay | Admitting: *Deleted

## 2021-01-12 ENCOUNTER — Ambulatory Visit: Payer: Medicaid Other | Attending: Obstetrics and Gynecology

## 2021-01-12 ENCOUNTER — Ambulatory Visit: Payer: Medicaid Other | Admitting: *Deleted

## 2021-01-12 ENCOUNTER — Other Ambulatory Visit: Payer: Self-pay

## 2021-01-12 ENCOUNTER — Encounter: Payer: Self-pay | Admitting: *Deleted

## 2021-01-12 VITALS — BP 121/66 | HR 92

## 2021-01-12 DIAGNOSIS — Z3A28 28 weeks gestation of pregnancy: Secondary | ICD-10-CM

## 2021-01-12 DIAGNOSIS — Z148 Genetic carrier of other disease: Secondary | ICD-10-CM

## 2021-01-12 DIAGNOSIS — O403XX0 Polyhydramnios, third trimester, not applicable or unspecified: Secondary | ICD-10-CM | POA: Diagnosis not present

## 2021-01-12 DIAGNOSIS — Z8759 Personal history of other complications of pregnancy, childbirth and the puerperium: Secondary | ICD-10-CM

## 2021-01-12 DIAGNOSIS — O409XX0 Polyhydramnios, unspecified trimester, not applicable or unspecified: Secondary | ICD-10-CM

## 2021-01-12 DIAGNOSIS — O321XX0 Maternal care for breech presentation, not applicable or unspecified: Secondary | ICD-10-CM | POA: Diagnosis not present

## 2021-01-12 DIAGNOSIS — Z362 Encounter for other antenatal screening follow-up: Secondary | ICD-10-CM

## 2021-01-18 ENCOUNTER — Ambulatory Visit (INDEPENDENT_AMBULATORY_CARE_PROVIDER_SITE_OTHER): Payer: Medicaid Other | Admitting: Obstetrics

## 2021-01-18 ENCOUNTER — Other Ambulatory Visit: Payer: Self-pay

## 2021-01-18 ENCOUNTER — Other Ambulatory Visit: Payer: Medicaid Other

## 2021-01-18 ENCOUNTER — Encounter: Payer: Self-pay | Admitting: Obstetrics

## 2021-01-18 VITALS — BP 110/67 | HR 91 | Wt 204.0 lb

## 2021-01-18 DIAGNOSIS — O09293 Supervision of pregnancy with other poor reproductive or obstetric history, third trimester: Secondary | ICD-10-CM

## 2021-01-18 DIAGNOSIS — Z23 Encounter for immunization: Secondary | ICD-10-CM

## 2021-01-18 DIAGNOSIS — Z3481 Encounter for supervision of other normal pregnancy, first trimester: Secondary | ICD-10-CM

## 2021-01-18 DIAGNOSIS — O09299 Supervision of pregnancy with other poor reproductive or obstetric history, unspecified trimester: Secondary | ICD-10-CM

## 2021-01-18 DIAGNOSIS — Z3480 Encounter for supervision of other normal pregnancy, unspecified trimester: Secondary | ICD-10-CM

## 2021-01-18 DIAGNOSIS — Z3A28 28 weeks gestation of pregnancy: Secondary | ICD-10-CM

## 2021-01-18 NOTE — Progress Notes (Signed)
Pt doing well with no complaints.  PHQ9 - score 1.

## 2021-01-18 NOTE — Progress Notes (Signed)
Subjective:  Debra Lucas is a 25 y.o. G3P2002 at [redacted]w[redacted]d being seen today for ongoing prenatal care.  She is currently monitored for the following issues for this low-risk pregnancy and has History of group B Streptococcus (GBS) infection and Encounter for supervision of normal pregnancy in first trimester on their problem list.  Patient reports backache and heartburn.  Contractions: Irritability. Vag. Bleeding: None.  Movement: Present. Denies leaking of fluid.   The following portions of the patient's history were reviewed and updated as appropriate: allergies, current medications, past family history, past medical history, past social history, past surgical history and problem list. Problem list updated.  Objective:   Vitals:   01/18/21 0840  BP: 110/67  Pulse: 91  Weight: 204 lb (92.5 kg)    Fetal Status:     Movement: Present     General:  Alert, oriented and cooperative. Patient is in no acute distress.  Skin: Skin is warm and dry. No rash noted.   Cardiovascular: Normal heart rate noted  Respiratory: Normal respiratory effort, no problems with respiration noted  Abdomen: Soft, gravid, appropriate for gestational age. Pain/Pressure: Absent     Pelvic:  Cervical exam deferred        Extremities: Normal range of motion.     Mental Status: Normal mood and affect. Normal behavior. Normal judgment and thought content.   Urinalysis:      Assessment and Plan:  Pregnancy: G3P2002 at [redacted]w[redacted]d  1. Supervision of other normal pregnancy, antepartum Rx: - Glucose Tolerance, 2 Hours w/1 Hour - CBC - HIV Antibody (routine testing w rflx) - RPR - Tdap vaccine greater than or equal to 7yo IM  2. History of pre-eclampsia in prior pregnancy, currently pregnant - taking Baby ASA   Preterm labor symptoms and general obstetric precautions including but not limited to vaginal bleeding, contractions, leaking of fluid and fetal movement were reviewed in detail with the patient. Please  refer to After Visit Summary for other counseling recommendations.   Return in about 2 weeks (around 02/01/2021) for ROB.   Brock Bad, MD  01/18/21

## 2021-01-19 LAB — CBC
Hematocrit: 34.6 % (ref 34.0–46.6)
Hemoglobin: 10.7 g/dL — ABNORMAL LOW (ref 11.1–15.9)
MCH: 22.9 pg — ABNORMAL LOW (ref 26.6–33.0)
MCHC: 30.9 g/dL — ABNORMAL LOW (ref 31.5–35.7)
MCV: 74 fL — ABNORMAL LOW (ref 79–97)
Platelets: 163 10*3/uL (ref 150–450)
RBC: 4.68 x10E6/uL (ref 3.77–5.28)
RDW: 14.7 % (ref 11.7–15.4)
WBC: 6.3 10*3/uL (ref 3.4–10.8)

## 2021-01-19 LAB — GLUCOSE TOLERANCE, 2 HOURS W/ 1HR
Glucose, 1 hour: 101 mg/dL (ref 65–179)
Glucose, 2 hour: 81 mg/dL (ref 65–152)
Glucose, Fasting: 76 mg/dL (ref 65–91)

## 2021-01-19 LAB — RPR: RPR Ser Ql: NONREACTIVE

## 2021-01-19 LAB — HIV ANTIBODY (ROUTINE TESTING W REFLEX): HIV Screen 4th Generation wRfx: NONREACTIVE

## 2021-01-20 ENCOUNTER — Other Ambulatory Visit: Payer: Self-pay | Admitting: Obstetrics

## 2021-01-20 DIAGNOSIS — O99019 Anemia complicating pregnancy, unspecified trimester: Secondary | ICD-10-CM

## 2021-01-20 MED ORDER — IRON POLYSACCH CMPLX-B12-FA 150-0.025-1 MG PO CAPS: 1.0000 | ORAL_CAPSULE | ORAL | 6 refills | Status: DC

## 2021-02-01 ENCOUNTER — Other Ambulatory Visit: Payer: Self-pay

## 2021-02-01 ENCOUNTER — Encounter: Payer: Self-pay | Admitting: Obstetrics

## 2021-02-01 ENCOUNTER — Ambulatory Visit: Payer: Medicaid Other | Admitting: Obstetrics

## 2021-02-01 VITALS — BP 122/74 | HR 90 | Wt 206.0 lb

## 2021-02-01 DIAGNOSIS — O403XX0 Polyhydramnios, third trimester, not applicable or unspecified: Secondary | ICD-10-CM | POA: Insufficient documentation

## 2021-02-01 DIAGNOSIS — O0990 Supervision of high risk pregnancy, unspecified, unspecified trimester: Secondary | ICD-10-CM

## 2021-02-01 DIAGNOSIS — O09299 Supervision of pregnancy with other poor reproductive or obstetric history, unspecified trimester: Secondary | ICD-10-CM

## 2021-02-01 NOTE — Progress Notes (Signed)
Subjective:  Debra Lucas is a 25 y.o. G3P2002 at [redacted]w[redacted]d being seen today for ongoing prenatal care.  She is currently monitored for the following issues for this high-risk pregnancy and has History of group B Streptococcus (GBS) infection and Encounter for supervision of normal pregnancy in first trimester on their problem list.  Patient reports no complaints.  Contractions: Irritability. Vag. Bleeding: None.  Movement: Present. Denies leaking of fluid.   The following portions of the patient's history were reviewed and updated as appropriate: allergies, current medications, past family history, past medical history, past social history, past surgical history and problem list. Problem list updated.  Objective:   Vitals:   02/01/21 0844  BP: 122/74  Pulse: 90  Weight: 206 lb (93.4 kg)    Fetal Status:     Movement: Present     General:  Alert, oriented and cooperative. Patient is in no acute distress.  Skin: Skin is warm and dry. No rash noted.   Cardiovascular: Normal heart rate noted  Respiratory: Normal respiratory effort, no problems with respiration noted  Abdomen: Soft, gravid, appropriate for gestational age. Pain/Pressure: Absent     Pelvic:  Cervical exam deferred        Extremities: Normal range of motion.  Edema: None  Mental Status: Normal mood and affect. Normal behavior. Normal judgment and thought content.   Urinalysis:      Assessment and Plan:  Pregnancy: G3P2002 at [redacted]w[redacted]d  1. Supervision of high risk pregnancy, antepartum  2. History of pre-eclampsia in prior pregnancy, currently pregnant - taking Baby ASA  3. Polyhydramnios affecting pregnancy in third trimester - fetal assessment by MFM per protocol   Preterm labor symptoms and general obstetric precautions including but not limited to vaginal bleeding, contractions, leaking of fluid and fetal movement were reviewed in detail with the patient. Please refer to After Visit Summary for other counseling  recommendations.   Return in about 2 weeks (around 02/15/2021) for [facility].   Brock Bad, MD  02/01/21

## 2021-02-01 NOTE — Progress Notes (Signed)
ROB [redacted]w[redacted]d  CC: None    

## 2021-02-09 ENCOUNTER — Ambulatory Visit: Payer: Medicaid Other | Admitting: *Deleted

## 2021-02-09 ENCOUNTER — Ambulatory Visit: Payer: Medicaid Other | Attending: Obstetrics and Gynecology

## 2021-02-09 ENCOUNTER — Other Ambulatory Visit: Payer: Self-pay

## 2021-02-09 ENCOUNTER — Other Ambulatory Visit: Payer: Self-pay | Admitting: *Deleted

## 2021-02-09 ENCOUNTER — Encounter: Payer: Self-pay | Admitting: *Deleted

## 2021-02-09 VITALS — BP 119/67 | HR 99

## 2021-02-09 DIAGNOSIS — O403XX0 Polyhydramnios, third trimester, not applicable or unspecified: Secondary | ICD-10-CM

## 2021-02-09 DIAGNOSIS — Z3481 Encounter for supervision of other normal pregnancy, first trimester: Secondary | ICD-10-CM | POA: Insufficient documentation

## 2021-02-09 DIAGNOSIS — O409XX0 Polyhydramnios, unspecified trimester, not applicable or unspecified: Secondary | ICD-10-CM | POA: Insufficient documentation

## 2021-02-09 DIAGNOSIS — O99013 Anemia complicating pregnancy, third trimester: Secondary | ICD-10-CM | POA: Diagnosis not present

## 2021-02-09 DIAGNOSIS — D649 Anemia, unspecified: Secondary | ICD-10-CM | POA: Diagnosis not present

## 2021-02-09 DIAGNOSIS — O09293 Supervision of pregnancy with other poor reproductive or obstetric history, third trimester: Secondary | ICD-10-CM

## 2021-02-09 DIAGNOSIS — Z3A32 32 weeks gestation of pregnancy: Secondary | ICD-10-CM

## 2021-02-15 ENCOUNTER — Encounter: Payer: Self-pay | Admitting: Obstetrics and Gynecology

## 2021-02-15 ENCOUNTER — Other Ambulatory Visit: Payer: Self-pay

## 2021-02-15 ENCOUNTER — Ambulatory Visit (INDEPENDENT_AMBULATORY_CARE_PROVIDER_SITE_OTHER): Payer: Medicaid Other | Admitting: Obstetrics and Gynecology

## 2021-02-15 VITALS — BP 123/77 | HR 96 | Wt 205.0 lb

## 2021-02-15 DIAGNOSIS — O0990 Supervision of high risk pregnancy, unspecified, unspecified trimester: Secondary | ICD-10-CM

## 2021-02-15 DIAGNOSIS — O403XX0 Polyhydramnios, third trimester, not applicable or unspecified: Secondary | ICD-10-CM

## 2021-02-15 DIAGNOSIS — O09299 Supervision of pregnancy with other poor reproductive or obstetric history, unspecified trimester: Secondary | ICD-10-CM

## 2021-02-15 NOTE — Progress Notes (Signed)
   PRENATAL VISIT NOTE  Subjective:  Debra Lucas is a 25 y.o. G3P2002 at [redacted]w[redacted]d being seen today for ongoing prenatal care.  She is currently monitored for the following issues for this high-risk pregnancy and has History of group B Streptococcus (GBS) infection; History of pre-eclampsia in prior pregnancy, currently pregnant; Encounter for supervision of normal pregnancy in first trimester; Supervision of high risk pregnancy, antepartum; and Polyhydramnios affecting pregnancy in third trimester on their problem list.  Patient reports no complaints.  Contractions: Irritability. Vag. Bleeding: None.  Movement: Present. Denies leaking of fluid.   The following portions of the patient's history were reviewed and updated as appropriate: allergies, current medications, past family history, past medical history, past social history, past surgical history and problem list.   Objective:   Vitals:   02/15/21 1557  BP: 123/77  Pulse: 96  Weight: 205 lb (93 kg)    Fetal Status: Fetal Heart Rate (bpm): 150 Fundal Height: 35 cm Movement: Present     General:  Alert, oriented and cooperative. Patient is in no acute distress.  Skin: Skin is warm and dry. No rash noted.   Cardiovascular: Normal heart rate noted  Respiratory: Normal respiratory effort, no problems with respiration noted  Abdomen: Soft, gravid, appropriate for gestational age.  Pain/Pressure: Absent     Pelvic: Cervical exam deferred        Extremities: Normal range of motion.  Edema: None  Mental Status: Normal mood and affect. Normal behavior. Normal judgment and thought content.   Assessment and Plan:  Pregnancy: G3P2002 at [redacted]w[redacted]d 1. Supervision of high risk pregnancy, antepartum Patient is doing well without complaints Undecided on pediatrician and contraception  2. Polyhydramnios affecting pregnancy in third trimester Scheduled for follow up with MFM  3. History of pre-eclampsia in prior pregnancy, currently  pregnant Continue ASA  Preterm labor symptoms and general obstetric precautions including but not limited to vaginal bleeding, contractions, leaking of fluid and fetal movement were reviewed in detail with the patient. Please refer to After Visit Summary for other counseling recommendations.   Return in about 2 weeks (around 03/01/2021) for in person, ROB, High risk.  Future Appointments  Date Time Provider Department Center  03/09/2021  3:30 PM [facility] NURSE [facility]  03/09/2021  3:45 PM [facility] US1 [facility]    Catalina Antigua, MD

## 2021-02-15 NOTE — Patient Instructions (Signed)
AREA PEDIATRIC/FAMILY PRACTICE PHYSICIANS  [place] ([address]) [facility] Chambliss, MD; Eniola, MD; Hale, MD; Hensel, MD; McDiarmid, MD; McIntyer, MD; Neal, MD; Walden, MD [address] [place], [place] [address] [phone] Mon-Fri 8:30-12:30, 1:30-5:00 Providers come to see babies at [facility] Accepting Medicaid [facility] providers who accept newborns: Koirala, MD; Morrow, MD; Wolters, MD [address], [place], [place] [address] [phone] Mon-Fri 8:00-5:30 Babies seen by providers at [facility] Does NOT accept Medicaid Please call early in hospitalization for appointment (limited availability)  [facility] Mulberry, MD [address] [place], [place] [address] [phone] Mon, Tue, Thur, Fri 8:30-5:00, Wed 10:00-7:00 (closed 1-2pm) Babies seen by [facility] providers Accepting Medicaid Rubin - Pediatrician Rubin, MD [address], [place], [place] [address] [phone] Mon-Fri 8:30-5:00, Sat 8:30-12:00 Provider comes to see babies at [facility] Accepting Medicaid Must have been referred from current patients or contacted office prior to delivery [facility] ([facility]) Brown, MD; Chandler, MD; Ettefagh, MD; Grant, MD; Lester, MD; McCormick, MD; McQueen, MD; Prose, MD; Simha, MD; Stanley, MD; Stryffeler, NP; Tebben, NP [address], [place], [place] [address] [phone] Mon, Tue, Thur, Fri 8:30-5:30, Wed 9:30-5:30, Sat 8:30-12:30 Babies seen by [facility] providers Accepting Medicaid Only accepting infants of first-time parents or siblings of current patients Hospital discharge coordinator will make follow-up appointment Debra Lucas [address], [place], [place]  [address] [phone]   Fax - [phone] [facility] [address], [place], [place]  [address] Phone - [phone]   Fax - [phone] Shilpa Gosrani [address], [place], [place]  [address] [phone]  [place] ([address]) [facility] Bates, MD; Brassfield, MD; Cooper, Cox, MD; MD; Davis, MD; Dovico, MD; Ettefaugh, MD; Little, MD; Lowe, MD; Keiffer, MD; Melvin, MD; Sumner, MD; Williams, MD [address], [place], [place] [address] [phone] Mon-Fri 8:30-5:00 (extended evenings Mon-Thur as needed), Sat-Sun 10:00-1:00 Providers come to see babies at [facility] Accepting Medicaid for families of first-time babies and families with all children in the household age 3 and under. Must register with office prior to making appointment (M-F only). [facility] Henson, NP; Knapp, MD; Lalonde, MD; Tysinger, PA [address] [place], [place] [address] [phone] Mon-Fri 8:00-5:00 Babies seen by providers at [facility] Does NOT accept Medicaid/Commercial Insurance Only [facility] ([facility])  Artis, MD; Barnes, MD; Bratton, MD; Coccaro, MD; Lockett Gardner, MD; Kramer, MD; Marshall, MD; Netherton, MD; Poleto, MD; Skinner, MD [address] [place], [place] [address] [phone] Mon-Fri 8:30-5:30, Sat (Oct.-Mar.) 9:00-1:00 Babies seen by providers at [facility] Accepting Medicaid  [place] ([address]) [facility] Reid, MD; Warner, MD [address], [place], [place] [address] [phone] Mon-Fri 8:30-5:00, Sat 8:30-12:00 Providers come to see babies at [facility] Does NOT accept Medicaid [facility] Becker, PA; Hagler, MD; Scifres, PA; Sun, MD; Swayne, MD [address], [place],  [address] [phone] Mon-Fri 8:00-5:00 Babies seen by providers at [facility] Does NOT accept Medicaid Only accepting babies of parents who  are patients Please call early in hospitalization for appointment (limited availability)  Pediatricians Clark, MD; Frye, MD; Kelleher, MD; Mack, NP; Miller, MD; O'Keller, MD; Patterson, NP; Pudlo, MD; Puzio, MD; Thomas, MD; Tucker, MD; Twiselton, MD [address], [place], [place] [address] [phone] Mon-Fri 8:00-5:00, Sat 9:00-12:00 Providers come to see babies at [facility] Does NOT accept Medicaid  [place] ([address]) [facility] providers accepting new patients: Brake, NP; Wharton, PA [address], [place], [place] [address] [phone] Mon-Fri 8:00-5:00 Babies seen by providers at [facility] Does NOT accept Medicaid Only accepting babies of parents who are patients Please call early in hospitalization for appointment (limited availability) [facility] Gay, MD; Quinlan, MD [address] [place], [place] [address] [phone] (press 1 to schedule appointment) Mon-Fri 8:00-5:00 Providers come to see babies at [facility] Does NOT accept Medicaid [facility] Mazer, MD [address] [place], [place] [address] [phone] Mon-Fri 8:30-5:00 (lunch 12:30-1:00), extended hours by appointment only Wed 5:00-6:30 Babies seen by [facility] providers Accepting Medicaid [facility] Banks, MD; Jordan, MD; Koberlein, MD [address], [place], [place] [address] [phone] Mon-Fri 8:00-5:00 Babies seen by [facility] providers Does NOT accept Medicaid [facility] Parker, MD; Hunter, MD; Wallace, DO [address] [place], [place] [address] [phone] Mon-Fri 8:00-5:00 Babies seen by [facility] providers Does NOT accept Medicaid [facility] Brandon, PA; Brecken, PA; Christy, NP; Dees, MD; DeClaire, MD; DeWeese, MD; Hansen, NP; Mills, NP; Parrish, NP; Smoot, NP; Summer, MD; Vapne,  MD [address] [place], [place] [address] [phone] Mon-Fri 8:30-5:00, Sat 10:00-1:00 Providers come to see babies at [facility] Does NOT accept Medicaid Free prenatal information session Tuesdays at 4:45pm [facility] Bouska, MD; Gordon, PA; Jeffery, PA; Weber, PA [address] [place] [address] [phone] Mon-Fri 7:30-5:30 Babies seen by [facility] providers [facility] [address], [place], [place]  [address] [phone]   Fax - [phone]  [place] ([address] & [address]) [facility] [place] [address] [place], [place] [address] [phone] Mon-Thur 8:00-6:00 Providers come to see babies at [facility] Accepting Medicaid [facility] Anderson, NP; Badger, MD; Beal, PA; Spencer, PA [address] [place], [place] [address] [phone] Mon-Thur 7:30-7:30, Fri 7:30-4:30 Babies seen by [facility] providers Accepting Medicaid [facility] Agbuya, MD; Klett, NP; Romgoolam, MD [address], [place], [place] [address] [phone] Mon-Fri 8:30-5:00, Sat 8:30-12:00 Providers come to see babies at [facility] Accepting Medicaid Must have "Meet & Greet" appointment at office prior to delivery [facility] McCord, MD; Wallace, MD; Wood, MD [address], [place], [place] [address] [phone] Mon-Wed 8:00-6:00, Thur-Fri 8:00-5:00, Sat 9:00-12:00 Providers come to see babies at [facility] Does NOT accept Medicaid Only accepting siblings of current patients [facility]  [address], [place], [place]  [address] [phone]   Fax - [phone] [facility] [address], [place], [place]  [address] [phone]   Fax -  [phone]  [place] ([address] & [address]) [facility] Cirigliano, DO; Matthews, DO [address] , [place] [address] [phone] Mon-Fri 7:00-5:00 Babies seen by [facility] providers Does NOT accept Medicaid [facility] Briscoe, MD; Howley, PA; Moreira, PA [address], [place],  [address] [phone] Mon-Fri 8:00-5:00 Babies seen by [facility] providers Accepting Medicaid [facility] Boyd, MD; Church, PA; Jones, NP; Osborn, PA [address], ,  [address] [phone] Mon-Fri 8:00-5:00 Babies seen by providers at [facility] Accepting Medicaid  [place] ([address]) [facility] [place] Wendling, DO [address] [place], [place] [address] [phone] Mon-Fri 8:00-5:00 Babies seen by [facility] providers Does NOT accept Medicaid Limited availability, please call early in hospitalization to schedule follow-up [facility] Calderon, PA; Cummings, MD; Dillard, MD; Martin, PA; Olson, MD; VanDeven, PA [address], [place], [place] [address] [phone] Mon-Fri 8:30-5:00, Sat 9:00-12:00 Babies seen by providers at [facility] Accepting Medicaid Please register online then schedule online or call office [email or web address] Hunter, NP; Kumar, MD; Martin Rogers, PA [address], [place], [place] [address] [phone] Mon-Fri 8:00-5:00 Babies seen by providers at [facility] Accepting Medicaid [facility] ([facility]) Hancock, MD; Kristi Fleenor, NP; West, MD [address], [place], [place] [address] [phone] Mon-Fri 8:00-5:30, Sat&Sun by appointment (phones open at  8:30) Babies seen by [facility] providers Accepting Medicaid Must be a first-time baby or sibling of current patient [facility] [place]  [address], [place], [place]  [address] [phone]   Fax - [phone]  [place] ([address]) [facility] Brown, PA; Cowen, PA; Rice, MD; Helton, PA; Spry, MD [address] [place], [place] [address] [phone] Mon-Thur 8:00-7:00, Fri 8:00-5:00, Sat 8:00-12:00, Sun 9:00-12:00 Babies seen by [facility] providers Accepting Medicaid [facility] Coe-Goins, MD; Marshall, MD; Pierre-Louis, MD [address], [place], [place] [address] [phone] Mon-Thur 8:00-5:00 Babies seen by providers at [facility] Accepting Medicaid [facility] Bratton, MD; Coe-Goins, MD; Hayes, MD; Lewis, MD; List, MD; Lott, MD; Marshall, MD; Moran, MD; O'Neal, MD; Pierre-Louis, MD; Pitonzo, MD; Scholer, MD; Spangle, MD [address] [place], [place] [address] [phone] Mon-Fri 8:00-5:30, Sat (Oct.-Mar.) 9:00-1:00 Babies seen by providers at [facility] Accepting Medicaid Must fill out new patient packet, available online at [email or web address] [facility] ([facility]) Friddle, NP; Harris, NP; Kelly, NP; Logan, MD; Melvin, PA; Poth, MD; Ramadoss, MD; Stanton, NP [address], [place], [place] [address] [phone] Mon-Thur 8:00-5:30, Fri 8:00-5:00 Babies seen by providers at [facility] Accepting Medicaid  [place] ([address]) [facility] Dixon, PA; Los Osos, MD; Pickard, MD; Tapia, PA [address], [place], [place] [address] [phone] Mon-Fri 8:00-5:00 Babies seen by providers at [facility] Accepting Medicaid   [place] ([address]) [facility] [place] Masneri, DO; Meyers, MD; Nelson, PA [address], [place], [place] [address] [phone] Mon-Fri 8:00-5:00 Babies seen by providers at [facility] Does NOT accept Medicaid Limited appointment availability, please call early in hospitalization  [facility] [place] Kunedd, DO; McGowen, MD [address], [place], [place] [address] [phone] Mon-Fri 8:00-5:00 Babies seen by [facility] providers Does NOT accept Medicaid [facility] [place] Cameron, MD; MacDonald, MD; Michaels, PA; Nayak, MD [address] [place] [address], [place], [place] [address] [phone] Mon-Fri 8:00-5:00 After hours clinic ([address] [place],  [address]) [phone] Mon-Fri 5:00-8:00, Sat 12:00-6:00, Sun 10:00-4:00 Babies seen by [facility] providers Accepting Medicaid [facility] [place] [address], [place], [place]  [address] [phone]   Fax - [phone]  [place] ([address]) [facility] [place] [facility] Andy, MD [address], [place], [place] [address] [phone] Mon-Fri 8:00-5:00 Babies seen by [facility] providers Does NOT accept Medicaid [facility] [place] ([facility] [place]) Eksir, MD [address], [place], [place] [address] [phone] Mon-Thur 8:00-7:00, Fri 8:00-5:00, Sat 8:00-12:00 Babies seen by providers at [facility] Accepting Medicaid - but does not have vaccinations in office (must be received elsewhere) Limited availability, please call early in hospitalization  [place] ([address]) [facility]  Charlene Flemming, MD [address],  [place] [address] [phone]  Fax [phone]  [facility]  Kimberly Newton, MD, Annamarie Streilein, PA, Carla Hampton, PA [address] [place] [place], [place]  [address] [phone] [facility]  [address], [place], [place] [address] [phone] [address], [place], [place] [address] [phone] (West Office)  [facility] [address], [place], [place] [address] [phone] [facility] [address] [place], [place], [place] [address] [phone] [facility] [address], [place], [place] [address] [phone] [facility] [address], [place], [place] [address] [phone] [facility] [address], [place], [place] [address] [phone] [facility] [address], [place], [place] [address] [phone] [facility]  [address], [place], [place] [address] [phone] [facility] [address] [place] [address], [place], [place] [address] [phone] [facility] [address], [place], [place] [address] [phone] [facility] [address], [place], [place] [address] [phone]  

## 2021-03-01 ENCOUNTER — Other Ambulatory Visit: Payer: Self-pay

## 2021-03-01 ENCOUNTER — Ambulatory Visit (INDEPENDENT_AMBULATORY_CARE_PROVIDER_SITE_OTHER): Payer: Medicaid Other | Admitting: Obstetrics and Gynecology

## 2021-03-01 ENCOUNTER — Encounter: Payer: Self-pay | Admitting: Obstetrics and Gynecology

## 2021-03-01 VITALS — BP 114/71 | HR 109 | Wt 208.9 lb

## 2021-03-01 DIAGNOSIS — O0990 Supervision of high risk pregnancy, unspecified, unspecified trimester: Secondary | ICD-10-CM

## 2021-03-01 DIAGNOSIS — O09299 Supervision of pregnancy with other poor reproductive or obstetric history, unspecified trimester: Secondary | ICD-10-CM

## 2021-03-01 DIAGNOSIS — Z23 Encounter for immunization: Secondary | ICD-10-CM

## 2021-03-01 DIAGNOSIS — Z8619 Personal history of other infectious and parasitic diseases: Secondary | ICD-10-CM

## 2021-03-01 DIAGNOSIS — Z3481 Encounter for supervision of other normal pregnancy, first trimester: Secondary | ICD-10-CM

## 2021-03-01 DIAGNOSIS — O403XX0 Polyhydramnios, third trimester, not applicable or unspecified: Secondary | ICD-10-CM

## 2021-03-01 NOTE — Progress Notes (Signed)
Subjective:  Debra Lucas is a 25 y.o. G3P2002 at [redacted]w[redacted]d being seen today for ongoing prenatal care.  She is currently monitored for the following issues for this high-risk pregnancy and has History of group B Streptococcus (GBS) infection; History of pre-eclampsia in prior pregnancy, currently pregnant; Encounter for supervision of normal pregnancy in first trimester; Supervision of high risk pregnancy, antepartum; and Polyhydramnios affecting pregnancy in third trimester on their problem list.  Patient reports general discomforts of pregnancy.  Contractions: Not present. Vag. Bleeding: None.  Movement: Present. Denies leaking of fluid.   The following portions of the patient's history were reviewed and updated as appropriate: allergies, current medications, past family history, past medical history, past social history, past surgical history and problem list. Problem list updated.  Objective:   Vitals:   03/01/21 0857  BP: 114/71  Pulse: (!) 109  Weight: 208 lb 14.4 oz (94.8 kg)    Fetal Status: Fetal Heart Rate (bpm): 145   Movement: Present     General:  Alert, oriented and cooperative. Patient is in no acute distress.  Skin: Skin is warm and dry. No rash noted.   Cardiovascular: Normal heart rate noted  Respiratory: Normal respiratory effort, no problems with respiration noted  Abdomen: Soft, gravid, appropriate for gestational age. Pain/Pressure: Absent     Pelvic:  Cervical exam deferred        Extremities: Normal range of motion.  Edema: None  Mental Status: Normal mood and affect. Normal behavior. Normal judgment and thought content.   Urinalysis:      Assessment and Plan:  Pregnancy: G3P2002 at [redacted]w[redacted]d  1. Supervision of high risk pregnancy, antepartum Stable Vaginal cultures next visit Flu vaccine today  2. Polyhydramnios affecting pregnancy in third trimester MFM U/S next week. Timing of delivery will be based on these results  3. History of pre-eclampsia in  prior pregnancy, currently pregnant Presently no S/Sx. BP stable Continue with qd BASA  4. History of group B Streptococcus (GBS) infection Tx while in labor     Preterm labor symptoms and general obstetric precautions including but not limited to vaginal bleeding, contractions, leaking of fluid and fetal movement were reviewed in detail with the patient. Please refer to After Visit Summary for other counseling recommendations.  Return in about 2 weeks (around 03/15/2021) for OB visit, face to face, any provider.   Hermina Staggers, MD

## 2021-03-01 NOTE — Patient Instructions (Signed)

## 2021-03-01 NOTE — Progress Notes (Signed)
Pt reports fetal movement, denies pain.  

## 2021-03-01 NOTE — Addendum Note (Signed)
Addended by: Natale Milch D on: 03/01/2021 09:26 AM   Modules accepted: Orders

## 2021-03-09 ENCOUNTER — Other Ambulatory Visit: Payer: Self-pay

## 2021-03-09 ENCOUNTER — Ambulatory Visit: Payer: Medicaid Other | Attending: Obstetrics

## 2021-03-09 ENCOUNTER — Ambulatory Visit: Payer: Medicaid Other | Admitting: *Deleted

## 2021-03-09 VITALS — BP 122/75 | HR 92

## 2021-03-09 DIAGNOSIS — O409XX0 Polyhydramnios, unspecified trimester, not applicable or unspecified: Secondary | ICD-10-CM | POA: Insufficient documentation

## 2021-03-09 DIAGNOSIS — O403XX0 Polyhydramnios, third trimester, not applicable or unspecified: Secondary | ICD-10-CM | POA: Insufficient documentation

## 2021-03-09 DIAGNOSIS — O09293 Supervision of pregnancy with other poor reproductive or obstetric history, third trimester: Secondary | ICD-10-CM | POA: Diagnosis not present

## 2021-03-09 DIAGNOSIS — Z3A36 36 weeks gestation of pregnancy: Secondary | ICD-10-CM | POA: Diagnosis not present

## 2021-03-15 ENCOUNTER — Other Ambulatory Visit (HOSPITAL_COMMUNITY)
Admission: RE | Admit: 2021-03-15 | Discharge: 2021-03-15 | Disposition: A | Discharge status: Home / Self Care | Payer: Medicaid Other | Source: Ambulatory Visit | Attending: Advanced Practice Midwife | Admitting: Advanced Practice Midwife

## 2021-03-15 ENCOUNTER — Other Ambulatory Visit: Payer: Self-pay

## 2021-03-15 ENCOUNTER — Ambulatory Visit (INDEPENDENT_AMBULATORY_CARE_PROVIDER_SITE_OTHER): Payer: Medicaid Other | Admitting: Advanced Practice Midwife

## 2021-03-15 VITALS — BP 129/80 | HR 94 | Wt 212.0 lb

## 2021-03-15 DIAGNOSIS — Z3A36 36 weeks gestation of pregnancy: Secondary | ICD-10-CM

## 2021-03-15 DIAGNOSIS — O0990 Supervision of high risk pregnancy, unspecified, unspecified trimester: Secondary | ICD-10-CM | POA: Insufficient documentation

## 2021-03-15 DIAGNOSIS — Z3493 Encounter for supervision of normal pregnancy, unspecified, third trimester: Secondary | ICD-10-CM

## 2021-03-15 DIAGNOSIS — O403XX0 Polyhydramnios, third trimester, not applicable or unspecified: Secondary | ICD-10-CM

## 2021-03-15 LAB — OB RESULTS CONSOLE GC/CHLAMYDIA: Gonorrhea: NEGATIVE

## 2021-03-15 NOTE — Progress Notes (Signed)
   PRENATAL VISIT NOTE  Subjective:  Debra Lucas is a 25 y.o. G3P2002 at [redacted]w[redacted]d being seen today for ongoing prenatal care.  She is currently monitored for the following issues for this low-risk pregnancy and has History of group B Streptococcus (GBS) infection; History of pre-eclampsia in prior pregnancy, currently pregnant; Encounter for supervision of normal pregnancy in first trimester; and Polyhydramnios affecting pregnancy in third trimester on their problem list.  Patient reports no complaints.  Contractions: Irregular. Vag. Bleeding: None.  Movement: Present. Denies leaking of fluid.   The following portions of the patient's history were reviewed and updated as appropriate: allergies, current medications, past family history, past medical history, past social history, past surgical history and problem list.   Objective:   Vitals:   03/15/21 1007  BP: 129/80  Pulse: 94  Weight: 212 lb (96.2 kg)    Fetal Status: Fetal Heart Rate (bpm): 140 Fundal Height: 38 cm Movement: Present     General:  Alert, oriented and cooperative. Patient is in no acute distress.  Skin: Skin is warm and dry. No rash noted.   Cardiovascular: Normal heart rate noted  Respiratory: Normal respiratory effort, no problems with respiration noted  Abdomen: Soft, gravid, appropriate for gestational age.  Pain/Pressure: Present     Pelvic: Cervical exam deferred        Extremities: Normal range of motion.     Mental Status: Normal mood and affect. Normal behavior. Normal judgment and thought content.   Assessment and Plan:  Pregnancy: G3P2002 at [redacted]w[redacted]d 1. Supervision of low-risk pregnancy, third trimester --Anticipatory guidance about next visits/weeks of pregnancy given. --Next visit in 1 week  - Cervicovaginal ancillary only( [place]) - Culture, beta strep (group b only)  2. [redacted] weeks gestation of pregnancy   3. Polyhydramnios affecting pregnancy in third trimester --Mild polyhydramnios on [place]  10/21, follow up as clinically indicated per MFM   Preterm labor symptoms and general obstetric precautions including but not limited to vaginal bleeding, contractions, leaking of fluid and fetal movement were reviewed in detail with the patient. Please refer to After Visit Summary for other counseling recommendations.   Return in about 1 week (around 03/22/2021).  Future Appointments  Date Time Provider Department Center  03/22/2021  4:10 PM Constant, Gigi [place], MD [facility] None    Sharen Counter, CNM

## 2021-03-16 LAB — CERVICOVAGINAL ANCILLARY ONLY
Chlamydia: NEGATIVE
Comment: NEGATIVE
Comment: NORMAL
Neisseria Gonorrhea: NEGATIVE

## 2021-03-19 LAB — CULTURE, BETA STREP (GROUP B ONLY): Strep Gp B Culture: NEGATIVE

## 2021-03-22 ENCOUNTER — Other Ambulatory Visit: Payer: Self-pay

## 2021-03-22 ENCOUNTER — Encounter: Payer: Self-pay | Admitting: Obstetrics and Gynecology

## 2021-03-22 ENCOUNTER — Ambulatory Visit (INDEPENDENT_AMBULATORY_CARE_PROVIDER_SITE_OTHER): Payer: Medicaid Other | Admitting: Obstetrics and Gynecology

## 2021-03-22 VITALS — BP 125/79 | HR 97 | Wt 211.0 lb

## 2021-03-22 DIAGNOSIS — O403XX0 Polyhydramnios, third trimester, not applicable or unspecified: Secondary | ICD-10-CM

## 2021-03-22 DIAGNOSIS — O09299 Supervision of pregnancy with other poor reproductive or obstetric history, unspecified trimester: Secondary | ICD-10-CM

## 2021-03-22 DIAGNOSIS — Z3481 Encounter for supervision of other normal pregnancy, first trimester: Secondary | ICD-10-CM

## 2021-03-22 NOTE — Progress Notes (Signed)
   PRENATAL VISIT NOTE  Subjective:  Debra Lucas is a 25 y.o. G3P2002 at [redacted]w[redacted]d being seen today for ongoing prenatal care.  She is currently monitored for the following issues for this low-risk pregnancy and has History of group B Streptococcus (GBS) infection; History of pre-eclampsia in prior pregnancy, currently pregnant; Encounter for supervision of normal pregnancy in first trimester; and Polyhydramnios affecting pregnancy in third trimester on their problem list.  Patient reports no complaints.  Contractions: Irregular. Vag. Bleeding: None.  Movement: Present. Denies leaking of fluid.   The following portions of the patient's history were reviewed and updated as appropriate: allergies, current medications, past family history, past medical history, past social history, past surgical history and problem list.   Objective:   Vitals:   03/22/21 1612  BP: 125/79  Pulse: 97  Weight: 211 lb (95.7 kg)    Fetal Status: Fetal Heart Rate (bpm): 150 Fundal Height: 39 cm Movement: Present     General:  Alert, oriented and cooperative. Patient is in no acute distress.  Skin: Skin is warm and dry. No rash noted.   Cardiovascular: Normal heart rate noted  Respiratory: Normal respiratory effort, no problems with respiration noted  Abdomen: Soft, gravid, appropriate for gestational age.  Pain/Pressure: Present     Pelvic: Cervical exam deferred        Extremities: Normal range of motion.  Edema: None  Mental Status: Normal mood and affect. Normal behavior. Normal judgment and thought content.   Assessment and Plan:  Pregnancy: G3P2002 at [redacted]w[redacted]d 1. Encounter for supervision of other normal pregnancy in first trimester Patient is doing well without complaints  2. Polyhydramnios affecting pregnancy in third trimester Mild on 10/21 scan- no further follow up  3. History of pre-eclampsia in prior pregnancy, currently pregnant Normotensive and asymptomatic  Term labor symptoms and  general obstetric precautions including but not limited to vaginal bleeding, contractions, leaking of fluid and fetal movement were reviewed in detail with the patient. Please refer to After Visit Summary for other counseling recommendations.   Return in about 1 week (around 03/29/2021) for in person, ROB, Low risk.  No future appointments.  Catalina Antigua, MD

## 2021-03-29 ENCOUNTER — Ambulatory Visit (INDEPENDENT_AMBULATORY_CARE_PROVIDER_SITE_OTHER): Payer: Medicaid Other | Admitting: Obstetrics

## 2021-03-29 ENCOUNTER — Other Ambulatory Visit: Payer: Self-pay

## 2021-03-29 ENCOUNTER — Encounter: Payer: Self-pay | Admitting: Obstetrics

## 2021-03-29 VITALS — BP 125/72 | HR 99 | Wt 215.4 lb

## 2021-03-29 DIAGNOSIS — Z8619 Personal history of other infectious and parasitic diseases: Secondary | ICD-10-CM

## 2021-03-29 DIAGNOSIS — Z3481 Encounter for supervision of other normal pregnancy, first trimester: Secondary | ICD-10-CM

## 2021-03-29 DIAGNOSIS — O09299 Supervision of pregnancy with other poor reproductive or obstetric history, unspecified trimester: Secondary | ICD-10-CM

## 2021-03-29 DIAGNOSIS — O403XX0 Polyhydramnios, third trimester, not applicable or unspecified: Secondary | ICD-10-CM

## 2021-03-29 NOTE — Addendum Note (Signed)
Addended by: Coral Ceo A on: 03/29/2021 09:51 AM   Modules accepted: Orders, SmartSet

## 2021-03-29 NOTE — Progress Notes (Signed)
Subjective:  Debra Lucas is a 25 y.o. G3P2002 at [redacted]w[redacted]d being seen today for ongoing prenatal care.  She is currently monitored for the following issues for this high-risk pregnancy and has History of group B Streptococcus (GBS) infection; History of pre-eclampsia in prior pregnancy, currently pregnant; Encounter for supervision of normal pregnancy in first trimester; and Polyhydramnios affecting pregnancy in third trimester on their problem list.  Patient reports no complaints.  Contractions: Irregular. Vag. Bleeding: None.  Movement: Present. Denies leaking of fluid.   The following portions of the patient's history were reviewed and updated as appropriate: allergies, current medications, past family history, past medical history, past social history, past surgical history and problem list. Problem list updated.  Objective:   Vitals:   03/29/21 0854  BP: 125/72  Pulse: 99  Weight: 215 lb 6.4 oz (97.7 kg)    Fetal Status:     Movement: Present     General:  Alert, oriented and cooperative. Patient is in no acute distress.  Skin: Skin is warm and dry. No rash noted.   Cardiovascular: Normal heart rate noted  Respiratory: Normal respiratory effort, no problems with respiration noted  Abdomen: Soft, gravid, appropriate for gestational age. Pain/Pressure: Present     Pelvic:  Cervical exam deferred        Extremities: Normal range of motion.  Edema: Trace  Mental Status: Normal mood and affect. Normal behavior. Normal judgment and thought content.   Urinalysis:      Assessment and Plan:  Pregnancy: G3P2002 at [redacted]w[redacted]d  1. Encounter for supervision of other normal pregnancy in first trimester  2. Polyhydramnios affecting pregnancy in third trimester, mild - IOL at 39.3 weeks  3. History of pre-eclampsia in prior pregnancy, currently pregnant - clinically stable   4. History of group B Streptococcus (GBS) infection - treat in labor  Term labor symptoms and general obstetric  precautions including but not limited to vaginal bleeding, contractions, leaking of fluid and fetal movement were reviewed in detail with the patient. Please refer to After Visit Summary for other counseling recommendations.   Return in about 4 weeks (around 04/26/2021) for postpartum visit ( IOL next week for polyhydramnios ).   Brock Bad, MD  03/29/21

## 2021-03-29 NOTE — Progress Notes (Signed)
ROB 38.6 wks  Polyhydramnios Irregular mild UCs Good FM Scheduled for IOL 39.3 wks for poly

## 2021-03-29 NOTE — Progress Notes (Signed)
Patient presents for ROB. Patient has no concerns today. 

## 2021-04-01 ENCOUNTER — Other Ambulatory Visit: Payer: Self-pay | Admitting: Advanced Practice Midwife

## 2021-04-02 ENCOUNTER — Inpatient Hospital Stay (HOSPITAL_COMMUNITY): Payer: Medicaid Other

## 2021-04-02 ENCOUNTER — Inpatient Hospital Stay (HOSPITAL_COMMUNITY): Payer: Medicaid Other | Admitting: Certified Registered Nurse Anesthetist

## 2021-04-02 ENCOUNTER — Inpatient Hospital Stay (HOSPITAL_COMMUNITY): Payer: Medicaid Other | Admitting: Anesthesiology

## 2021-04-02 ENCOUNTER — Inpatient Hospital Stay (HOSPITAL_COMMUNITY)
Admission: AD | Admit: 2021-04-02 | Discharge: 2021-04-04 | DRG: 787 | Disposition: A | Discharge status: Home / Self Care | Payer: Medicaid Other | Attending: Obstetrics and Gynecology | Admitting: Obstetrics and Gynecology

## 2021-04-02 ENCOUNTER — Other Ambulatory Visit: Payer: Self-pay

## 2021-04-02 ENCOUNTER — Encounter (HOSPITAL_COMMUNITY)
Admission: AD | Disposition: A | Discharge status: Home / Self Care | Payer: Self-pay | Source: Home / Self Care | Attending: Obstetrics and Gynecology

## 2021-04-02 ENCOUNTER — Encounter (HOSPITAL_COMMUNITY): Payer: Self-pay | Admitting: Family Medicine

## 2021-04-02 DIAGNOSIS — O9081 Anemia of the puerperium: Secondary | ICD-10-CM | POA: Diagnosis not present

## 2021-04-02 DIAGNOSIS — O99214 Obesity complicating childbirth: Secondary | ICD-10-CM | POA: Diagnosis present

## 2021-04-02 DIAGNOSIS — Z3491 Encounter for supervision of normal pregnancy, unspecified, first trimester: Secondary | ICD-10-CM

## 2021-04-02 DIAGNOSIS — O323XX0 Maternal care for face, brow and chin presentation, not applicable or unspecified: Secondary | ICD-10-CM | POA: Diagnosis not present

## 2021-04-02 DIAGNOSIS — Z3A39 39 weeks gestation of pregnancy: Secondary | ICD-10-CM

## 2021-04-02 DIAGNOSIS — O403XX0 Polyhydramnios, third trimester, not applicable or unspecified: Secondary | ICD-10-CM | POA: Diagnosis not present

## 2021-04-02 DIAGNOSIS — Z20822 Contact with and (suspected) exposure to covid-19: Secondary | ICD-10-CM | POA: Diagnosis not present

## 2021-04-02 DIAGNOSIS — Z8759 Personal history of other complications of pregnancy, childbirth and the puerperium: Secondary | ICD-10-CM

## 2021-04-02 DIAGNOSIS — O09299 Supervision of pregnancy with other poor reproductive or obstetric history, unspecified trimester: Secondary | ICD-10-CM

## 2021-04-02 DIAGNOSIS — Z7982 Long term (current) use of aspirin: Secondary | ICD-10-CM | POA: Diagnosis not present

## 2021-04-02 DIAGNOSIS — D62 Acute posthemorrhagic anemia: Secondary | ICD-10-CM | POA: Diagnosis not present

## 2021-04-02 LAB — CBC
HCT: 36.9 % (ref 36.0–46.0)
HCT: 33.1 % — ABNORMAL LOW (ref 36.0–46.0)
Hemoglobin: 11.3 g/dL — ABNORMAL LOW (ref 12.0–15.0)
Hemoglobin: 9.8 g/dL — ABNORMAL LOW (ref 12.0–15.0)
MCH: 22.9 pg — ABNORMAL LOW (ref 26.0–34.0)
MCH: 22.6 pg — ABNORMAL LOW (ref 26.0–34.0)
MCHC: 30.6 g/dL (ref 30.0–36.0)
MCHC: 29.6 g/dL — ABNORMAL LOW (ref 30.0–36.0)
MCV: 74.7 fL — ABNORMAL LOW (ref 80.0–100.0)
MCV: 76.4 fL — ABNORMAL LOW (ref 80.0–100.0)
Platelets: 153 10*3/uL (ref 150–400)
Platelets: 117 10*3/uL — ABNORMAL LOW (ref 150–400)
RBC: 4.94 MIL/uL (ref 3.87–5.11)
RBC: 4.33 MIL/uL (ref 3.87–5.11)
RDW: 15.2 % (ref 11.5–15.5)
RDW: 15.1 % (ref 11.5–15.5)
WBC: 6.9 10*3/uL (ref 4.0–10.5)
WBC: 11.1 10*3/uL — ABNORMAL HIGH (ref 4.0–10.5)
nRBC: 0 % (ref 0.0–0.2)
nRBC: 0 % (ref 0.0–0.2)

## 2021-04-02 LAB — RESP PANEL BY RT-PCR (FLU A&B, COVID) ARPGX2
Influenza A by PCR: NEGATIVE
Influenza B by PCR: NEGATIVE
SARS Coronavirus 2 by RT PCR: NEGATIVE

## 2021-04-02 LAB — TYPE AND SCREEN
ABO/RH(D): O POS
Antibody Screen: NEGATIVE

## 2021-04-02 SURGERY — Surgical Case
Anesthesia: General

## 2021-04-02 MED ORDER — OXYTOCIN-SODIUM CHLORIDE 30-0.9 UT/500ML-% IV SOLN
INTRAVENOUS | Status: DC | PRN
Start: 2021-04-02 — End: 2021-04-02
  Administered 2021-04-02: 30 [IU] via INTRAVENOUS

## 2021-04-02 MED ORDER — DIPHENHYDRAMINE HCL 25 MG PO CAPS: 25.0000 mg | ORAL_CAPSULE | ORAL | Status: DC | PRN

## 2021-04-02 MED ORDER — MISOPROSTOL 25 MCG QUARTER TABLET: 25.0000 ug | ORAL_TABLET | ORAL | Status: DC | PRN

## 2021-04-02 MED ORDER — EPHEDRINE 5 MG/ML INJ: 10.0000 mg | INTRAVENOUS | Status: DC | PRN

## 2021-04-02 MED ORDER — NALOXONE HCL 0.4 MG/ML IJ SOLN: 0.4000 mg | INTRAMUSCULAR | Status: DC | PRN

## 2021-04-02 MED ORDER — MIDAZOLAM HCL 2 MG/2ML IJ SOLN
INTRAMUSCULAR | Status: AC
  Filled 2021-04-02: qty 2

## 2021-04-02 MED ORDER — HYDROMORPHONE HCL 1 MG/ML IJ SOLN
INTRAMUSCULAR | Status: AC
  Filled 2021-04-02: qty 0.5

## 2021-04-02 MED ORDER — KETOROLAC TROMETHAMINE 30 MG/ML IJ SOLN: 30.0000 mg | Freq: Four times a day (QID) | INTRAMUSCULAR | Status: AC | PRN

## 2021-04-02 MED ORDER — SODIUM CHLORIDE 0.9 % IR SOLN
Status: DC | PRN
  Administered 2021-04-02: 1

## 2021-04-02 MED ORDER — ONDANSETRON HCL 4 MG/2ML IJ SOLN
4.0000 mg | Freq: Four times a day (QID) | INTRAMUSCULAR | Status: DC | PRN
  Administered 2021-04-02: 4 mg via INTRAVENOUS

## 2021-04-02 MED ORDER — PHENYLEPHRINE HCL-NACL 20-0.9 MG/250ML-% IV SOLN
INTRAVENOUS | Status: DC | PRN
  Administered 2021-04-02: 60 ug/min via INTRAVENOUS

## 2021-04-02 MED ORDER — ONDANSETRON HCL 4 MG/2ML IJ SOLN: 4.0000 mg | Freq: Three times a day (TID) | INTRAMUSCULAR | Status: DC | PRN

## 2021-04-02 MED ORDER — LACTATED RINGERS IV SOLN: 500.0000 mL | Freq: Once | INTRAVENOUS | Status: DC

## 2021-04-02 MED ORDER — ACETAMINOPHEN 10 MG/ML IV SOLN
INTRAVENOUS | Status: AC
  Filled 2021-04-02: qty 100

## 2021-04-02 MED ORDER — DEXAMETHASONE SODIUM PHOSPHATE 10 MG/ML IJ SOLN
INTRAMUSCULAR | Status: DC | PRN
  Administered 2021-04-02: 10 mg via INTRAVENOUS

## 2021-04-02 MED ORDER — PROMETHAZINE HCL 25 MG/ML IJ SOLN: 6.2500 mg | INTRAMUSCULAR | Status: DC | PRN

## 2021-04-02 MED ORDER — FENTANYL CITRATE (PF) 250 MCG/5ML IJ SOLN
INTRAMUSCULAR | Status: AC
  Filled 2021-04-02: qty 5

## 2021-04-02 MED ORDER — SOD CITRATE-CITRIC ACID 500-334 MG/5ML PO SOLN
30.0000 mL | ORAL | Status: DC | PRN
  Administered 2021-04-02: 30 mL via ORAL
  Filled 2021-04-02: qty 30

## 2021-04-02 MED ORDER — OXYCODONE HCL 5 MG PO TABS: 5.0000 mg | ORAL_TABLET | Freq: Once | ORAL | Status: DC | PRN

## 2021-04-02 MED ORDER — FENTANYL CITRATE (PF) 100 MCG/2ML IJ SOLN
INTRAMUSCULAR | Status: DC | PRN
  Administered 2021-04-02: 100 ug via INTRAVENOUS
  Administered 2021-04-02 (×2): 50 ug via INTRAVENOUS
  Administered 2021-04-02 (×2): 100 ug via INTRAVENOUS

## 2021-04-02 MED ORDER — OXYTOCIN-SODIUM CHLORIDE 30-0.9 UT/500ML-% IV SOLN
2.5000 [IU]/h | INTRAVENOUS | Status: DC
  Filled 2021-04-02: qty 500

## 2021-04-02 MED ORDER — FENTANYL CITRATE (PF) 100 MCG/2ML IJ SOLN
100.0000 ug | INTRAMUSCULAR | Status: DC | PRN
  Administered 2021-04-02: 100 ug via INTRAVENOUS
  Filled 2021-04-02: qty 2

## 2021-04-02 MED ORDER — OXYTOCIN-SODIUM CHLORIDE 30-0.9 UT/500ML-% IV SOLN
1.0000 m[IU]/min | INTRAVENOUS | Status: DC
Start: 2021-04-02 — End: 2021-04-02

## 2021-04-02 MED ORDER — ACETAMINOPHEN 500 MG PO TABS
1000.0000 mg | ORAL_TABLET | Freq: Four times a day (QID) | ORAL | Status: AC
  Administered 2021-04-03 (×4): 1000 mg via ORAL
  Filled 2021-04-02 (×4): qty 2

## 2021-04-02 MED ORDER — OXYTOCIN BOLUS FROM INFUSION: 333.0000 mL | Freq: Once | INTRAVENOUS | Status: DC

## 2021-04-02 MED ORDER — ROCURONIUM BROMIDE 100 MG/10ML IV SOLN
INTRAVENOUS | Status: DC | PRN
  Administered 2021-04-02: 30 mg via INTRAVENOUS

## 2021-04-02 MED ORDER — MIDAZOLAM HCL 2 MG/2ML IJ SOLN
INTRAMUSCULAR | Status: DC | PRN
Start: 2021-04-02 — End: 2021-04-02
  Administered 2021-04-02: 2 mg via INTRAVENOUS

## 2021-04-02 MED ORDER — METHYLERGONOVINE MALEATE 0.2 MG/ML IJ SOLN
INTRAMUSCULAR | Status: AC
  Filled 2021-04-02: qty 1

## 2021-04-02 MED ORDER — KETOROLAC TROMETHAMINE 30 MG/ML IJ SOLN
30.0000 mg | Freq: Four times a day (QID) | INTRAMUSCULAR | Status: AC | PRN
  Administered 2021-04-02: 30 mg via INTRAVENOUS

## 2021-04-02 MED ORDER — DROPERIDOL 2.5 MG/ML IJ SOLN: 0.6250 mg | Freq: Once | INTRAMUSCULAR | Status: DC | PRN

## 2021-04-02 MED ORDER — LACTATED RINGERS IV SOLN: 500.0000 mL | INTRAVENOUS | Status: DC | PRN

## 2021-04-02 MED ORDER — OXYCODONE HCL 5 MG/5ML PO SOLN: 5.0000 mg | Freq: Once | ORAL | Status: DC | PRN

## 2021-04-02 MED ORDER — OXYCODONE-ACETAMINOPHEN 5-325 MG PO TABS: 1.0000 | ORAL_TABLET | ORAL | Status: DC | PRN

## 2021-04-02 MED ORDER — PHENYLEPHRINE 40 MCG/ML (10ML) SYRINGE FOR IV PUSH (FOR BLOOD PRESSURE SUPPORT)
80.0000 ug | PREFILLED_SYRINGE | INTRAVENOUS | Status: DC | PRN
  Filled 2021-04-02: qty 10

## 2021-04-02 MED ORDER — FLEET ENEMA 7-19 GM/118ML RE ENEM: 1.0000 | ENEMA | RECTAL | Status: DC | PRN

## 2021-04-02 MED ORDER — ALBUMIN HUMAN 5 % IV SOLN: INTRAVENOUS | Status: DC | PRN

## 2021-04-02 MED ORDER — FENTANYL-BUPIVACAINE-NACL 0.5-0.125-0.9 MG/250ML-% EP SOLN
EPIDURAL | Status: AC
  Filled 2021-04-02: qty 250

## 2021-04-02 MED ORDER — OXYCODONE-ACETAMINOPHEN 5-325 MG PO TABS: 2.0000 | ORAL_TABLET | ORAL | Status: DC | PRN

## 2021-04-02 MED ORDER — FENTANYL-BUPIVACAINE-NACL 0.5-0.125-0.9 MG/250ML-% EP SOLN: 12.0000 mL/h | EPIDURAL | Status: DC | PRN

## 2021-04-02 MED ORDER — DIPHENHYDRAMINE HCL 50 MG/ML IJ SOLN: 12.5000 mg | INTRAMUSCULAR | Status: DC | PRN

## 2021-04-02 MED ORDER — LIDOCAINE HCL (PF) 1 % IJ SOLN: 30.0000 mL | INTRAMUSCULAR | Status: DC | PRN

## 2021-04-02 MED ORDER — MISOPROSTOL 50MCG HALF TABLET
50.0000 ug | ORAL_TABLET | ORAL | Status: DC | PRN
  Administered 2021-04-02: 50 ug via ORAL
  Filled 2021-04-02: qty 1

## 2021-04-02 MED ORDER — SUGAMMADEX SODIUM 200 MG/2ML IV SOLN
INTRAVENOUS | Status: DC | PRN
  Administered 2021-04-02: 200 mg via INTRAVENOUS

## 2021-04-02 MED ORDER — PHENYLEPHRINE HCL (PRESSORS) 10 MG/ML IV SOLN
INTRAVENOUS | Status: DC | PRN
  Administered 2021-04-02: 80 mg via INTRAVENOUS

## 2021-04-02 MED ORDER — SODIUM CHLORIDE 0.9% FLUSH: 3.0000 mL | INTRAVENOUS | Status: DC | PRN

## 2021-04-02 MED ORDER — ACETAMINOPHEN 325 MG PO TABS: 650.0000 mg | ORAL_TABLET | ORAL | Status: DC | PRN

## 2021-04-02 MED ORDER — PROPOFOL 10 MG/ML IV BOLUS
INTRAVENOUS | Status: DC | PRN
  Administered 2021-04-02: 200 mg via INTRAVENOUS

## 2021-04-02 MED ORDER — MEPERIDINE HCL 25 MG/ML IJ SOLN: 6.2500 mg | INTRAMUSCULAR | Status: DC | PRN

## 2021-04-02 MED ORDER — HYDROMORPHONE HCL 1 MG/ML IJ SOLN
0.2500 mg | INTRAMUSCULAR | Status: DC | PRN
  Administered 2021-04-02 (×2): 0.5 mg via INTRAVENOUS

## 2021-04-02 MED ORDER — KETOROLAC TROMETHAMINE 30 MG/ML IJ SOLN: 15.0000 mg | Freq: Once | INTRAMUSCULAR | Status: DC

## 2021-04-02 MED ORDER — FENTANYL CITRATE (PF) 100 MCG/2ML IJ SOLN
INTRAMUSCULAR | Status: AC
  Filled 2021-04-02: qty 2

## 2021-04-02 MED ORDER — SCOPOLAMINE 1 MG/3DAYS TD PT72
MEDICATED_PATCH | TRANSDERMAL | Status: AC
  Filled 2021-04-02: qty 1

## 2021-04-02 MED ORDER — LACTATED RINGERS IV SOLN: INTRAVENOUS | Status: DC

## 2021-04-02 MED ORDER — PHENYLEPHRINE 40 MCG/ML (10ML) SYRINGE FOR IV PUSH (FOR BLOOD PRESSURE SUPPORT): 80.0000 ug | PREFILLED_SYRINGE | INTRAVENOUS | Status: DC | PRN

## 2021-04-02 MED ORDER — KETOROLAC TROMETHAMINE 30 MG/ML IJ SOLN
INTRAMUSCULAR | Status: AC
  Filled 2021-04-02: qty 1

## 2021-04-02 MED ORDER — ACETAMINOPHEN 10 MG/ML IV SOLN
1000.0000 mg | Freq: Once | INTRAVENOUS | Status: DC | PRN
  Administered 2021-04-02: 1000 mg via INTRAVENOUS

## 2021-04-02 MED ORDER — SCOPOLAMINE 1 MG/3DAYS TD PT72
1.0000 | MEDICATED_PATCH | Freq: Once | TRANSDERMAL | Status: DC
  Administered 2021-04-02: 1.5 mg via TRANSDERMAL

## 2021-04-02 MED ORDER — DEXAMETHASONE SODIUM PHOSPHATE 10 MG/ML IJ SOLN
INTRAMUSCULAR | Status: AC
  Filled 2021-04-02: qty 1

## 2021-04-02 MED ORDER — TERBUTALINE SULFATE 1 MG/ML IJ SOLN
0.2500 mg | Freq: Once | INTRAMUSCULAR | Status: AC | PRN
  Administered 2021-04-02: 0.25 mg via SUBCUTANEOUS
  Filled 2021-04-02: qty 1

## 2021-04-02 MED ORDER — CEFAZOLIN SODIUM-DEXTROSE 2-4 GM/100ML-% IV SOLN
INTRAVENOUS | Status: AC
  Filled 2021-04-02: qty 100

## 2021-04-02 MED ORDER — ONDANSETRON HCL 4 MG/2ML IJ SOLN
INTRAMUSCULAR | Status: AC
  Filled 2021-04-02: qty 2

## 2021-04-02 MED ORDER — SUCCINYLCHOLINE CHLORIDE 200 MG/10ML IV SOSY
PREFILLED_SYRINGE | INTRAVENOUS | Status: DC | PRN
  Administered 2021-04-02: 180 mg via INTRAVENOUS

## 2021-04-02 MED ORDER — MIDAZOLAM HCL 5 MG/5ML IJ SOLN: INTRAMUSCULAR | Status: DC | PRN

## 2021-04-02 MED ORDER — CEFAZOLIN SODIUM-DEXTROSE 1-4 GM/50ML-% IV SOLN
INTRAVENOUS | Status: DC | PRN
  Administered 2021-04-02: 2 g via INTRAVENOUS

## 2021-04-02 SURGICAL SUPPLY — 33 items
APL SKNCLS STERI-STRIP NONHPOA (GAUZE/BANDAGES/DRESSINGS) ×1
BENZOIN TINCTURE PRP APPL 2/3 (GAUZE/BANDAGES/DRESSINGS) ×2 IMPLANT
CHLORAPREP W/TINT 26ML (MISCELLANEOUS) ×3 IMPLANT
CLAMP CORD UMBIL (MISCELLANEOUS) IMPLANT
CLOSURE STERI STRIP 1/2 X4 (GAUZE/BANDAGES/DRESSINGS) ×2 IMPLANT
CLOTH BEACON ORANGE TIMEOUT ST (SAFETY) ×3 IMPLANT
DRSG OPSITE POSTOP 4X10 (GAUZE/BANDAGES/DRESSINGS) ×3 IMPLANT
ELECT REM PT RETURN 9FT ADLT (ELECTROSURGICAL) ×3
ELECTRODE REM PT RTRN 9FT ADLT (ELECTROSURGICAL) ×1 IMPLANT
EXTRACTOR VACUUM KIWI (MISCELLANEOUS) IMPLANT
GLOVE SURG ENC MOIS LTX SZ6 (GLOVE) ×3 IMPLANT
GLOVE SURG UNDER POLY LF SZ7 (GLOVE) ×6 IMPLANT
GOWN STRL REUS W/TWL LRG LVL3 (GOWN DISPOSABLE) ×6 IMPLANT
KIT ABG SYR 3ML LUER SLIP (SYRINGE) IMPLANT
NDL HYPO 25X5/8 SAFETYGLIDE (NEEDLE) IMPLANT
NEEDLE HYPO 22GX1.5 SAFETY (NEEDLE) IMPLANT
NEEDLE HYPO 25X5/8 SAFETYGLIDE (NEEDLE) IMPLANT
NS IRRIG 1000ML POUR BTL (IV SOLUTION) ×3 IMPLANT
PACK C SECTION WH (CUSTOM PROCEDURE TRAY) ×3 IMPLANT
PAD OB MATERNITY 4.3X12.25 (PERSONAL CARE ITEMS) ×3 IMPLANT
PENCIL SMOKE EVAC W/HOLSTER (ELECTROSURGICAL) ×3 IMPLANT
RETRACTOR WND ALEXIS 25 LRG (MISCELLANEOUS) IMPLANT
RTRCTR WOUND ALEXIS 25CM LRG (MISCELLANEOUS)
SUT PLAIN 2 0 (SUTURE) ×3
SUT PLAIN ABS 2-0 CT1 27XMFL (SUTURE) IMPLANT
SUT VIC AB 0 CT1 36 (SUTURE) ×11 IMPLANT
SUT VIC AB 2-0 CT1 27 (SUTURE) ×3
SUT VIC AB 2-0 CT1 TAPERPNT 27 (SUTURE) ×1 IMPLANT
SUT VIC AB 4-0 KS 27 (SUTURE) ×3 IMPLANT
SYR CONTROL 10ML LL (SYRINGE) IMPLANT
TOWEL OR 17X24 6PK STRL BLUE (TOWEL DISPOSABLE) ×3 IMPLANT
TRAY FOLEY W/BAG SLVR 14FR LF (SET/KITS/TRAYS/PACK) IMPLANT
WATER STERILE IRR 1000ML POUR (IV SOLUTION) ×5 IMPLANT

## 2021-04-02 NOTE — Discharge Summary (Signed)
Postpartum Discharge Summary  Date of Service updated 04/04/21      Patient Name: Debra Lucas DOB: 03/27/1996 MRN: [id number]  Date of admission: 04/02/2021 Delivery date:04/02/2021  Delivering provider: Radene Gunning  Date of discharge: 04/04/2021  Admitting diagnosis: Polyhydramnios affecting pregnancy in third trimester [O40.3XX0] Intrauterine pregnancy: [redacted]w[redacted]d    Secondary diagnosis:  Principal Problem:   Cesarean delivery delivered Active Problems:   History of pre-eclampsia in prior pregnancy, currently pregnant   Encounter for supervision of normal pregnancy in first trimester   Polyhydramnios affecting pregnancy in third trimester  Additional problems: None    Discharge diagnosis: Term Pregnancy Delivered                                              Post partum procedures: none Augmentation: Cytotec and IP Foley Complications: [id number] Hospital course: Induction of Labor With Cesarean Section   25y.o. yo G3P3003 at 327w3das admitted to the hospital 04/02/2021 for induction of labor. Patient had her induction started with foley balloon placement and Cytotec. Patient quickly progressed to complete. Upon evaluation prior to starting pushing, fetus noted to be in face presentation. The patient went for cesarean section due to Non-Reassuring FHR and face presentation . Delivery details are as follows: Membrane Rupture Time/Date: 8:26 PM ,04/02/2021   Delivery Method:C-Section, Low Transverse  Details of operation can be found in separate operative Note.  Patient had an uncomplicated postpartum course. She is ambulating, tolerating a regular diet, passing flatus, and urinating well.  Patient is discharged home in stable condition on 04/04/21.      Newborn Data: Birth date:04/02/2021  Birth time:8:44 PM  Gender:Female  Living status:Living  Apgars:9 ,10  Weight:3430 g                                Magnesium Sulfate received: No BMZ received:  No Rhophylac:N/A MMR:N/A - Immune  T-DaP:Given prenatally Flu: Yes  Transfusion:No  IV Venofer  Physical exam  Vitals:   04/03/21 1338 04/03/21 2208 04/04/21 0506 04/04/21 1430  BP: 104/63 97/61 97/65  105/67  Pulse: 71 86 87 88  Resp: 20 18 17 17   Temp: 98.4 F (36.9 C) 98.2 F (36.8 C) 97.9 F (36.6 C) 97.6 F (36.4 C)  TempSrc:  Oral Oral Oral  SpO2:   99% 99%  Weight:      Height:       General: alert, cooperative, and no distress Lochia: appropriate Uterine Fundus: firm Incision: Dressing is clean, dry, and intact DVT Evaluation: No evidence of DVT seen on physical exam.  Labs: Lab Results  Component Value Date   WBC 13.0 (H) 04/03/2021   HGB 8.2 (L) 04/03/2021   HCT 27.0 (L) 04/03/2021   MCV 74.4 (L) 04/03/2021   PLT 135 (L) 04/03/2021   CMP Latest Ref Rng & Units 07/27/2017  Glucose 65 - 99 mg/dL 100(H)  BUN 6 - 20 mg/dL 11  Creatinine 0.44 - 1.00 mg/dL 0.81  Sodium 135 - 145 mmol/L 137  Potassium 3.5 - 5.1 mmol/L 3.8  Chloride 101 - 111 mmol/L 102  CO2 22 - 32 mmol/L 23  Calcium 8.9 - 10.3 mg/dL 9.0  Total Protein 6.5 - 8.1 g/dL 7.7  Total Bilirubin 0.3 - 1.2 mg/dL 0.6  Alkaline Phos  38 - 126 U/L 63  AST 15 - 41 U/L 22  ALT 14 - 54 U/L 15   Edinburgh Score: Edinburgh Postnatal Depression Scale Screening Tool 04/04/2021  I have been able to laugh and see the funny side of things. 0  I have looked forward with enjoyment to things. 0  I have blamed myself unnecessarily when things went wrong. 1  I have been anxious or worried for no good reason. 0  I have felt scared or panicky for no good reason. 0  Things have been getting on top of me. 1  I have been so unhappy that I have had difficulty sleeping. 0  I have felt sad or miserable. 0  I have been so unhappy that I have been crying. 0  The thought of harming myself has occurred to me. 0  Edinburgh Postnatal Depression Scale Total 2     After visit meds:  Allergies as of 04/04/2021        Reactions   Shellfish Allergy Swelling, Rash        Medication List     STOP taking these medications    aspirin EC 81 MG tablet       TAKE these medications    acetaminophen 500 MG tablet Commonly known as: TYLENOL Take 2 tablets (1,000 mg total) by mouth every 6 (six) hours as needed.   Blood Pressure Kit Devi 1 kit by Does not apply route once a week.   famotidine 20 MG tablet Commonly known as: Pepcid Take 1 tablet (20 mg total) by mouth 2 (two) times daily.   ibuprofen 600 MG tablet Commonly known as: ADVIL Take 1 tablet (600 mg total) by mouth every 6 (six) hours.   Iron Polysacch Cmplx-B12-FA 150-0.025-1 MG Caps Take 1 capsule by mouth every other day.   oxyCODONE 5 MG immediate release tablet Commonly known as: Oxy IR/ROXICODONE Take 1-2 tablets (5-10 mg total) by mouth every 6 (six) hours as needed for moderate pain.   Prenatal Complete 14-0.4 MG Tabs Take 1 each by mouth daily.         Discharge home in stable condition Infant Feeding: Breast Infant Disposition:home with mother Discharge instruction: per After Visit Summary and Postpartum booklet. Activity: Advance as tolerated. Pelvic rest for 6 weeks.  Diet: routine diet Future Appointments: Future Appointments  Date Time Provider [place]  04/10/2021  1:30 PM Woodroe Mode, MD [facility] None  05/02/2021 10:55 AM Chancy Milroy, MD [place] None   Follow up Visit: Message sent to [facility] by Dr. Gwenlyn Perking on 04/02/21.   Please schedule this patient for a In person postpartum visit in 6 weeks with the following provider: MD. Additional Postpartum F/U:Incision check 1 week  Low risk pregnancy complicated by:  Mild polyhydramnios Delivery mode:  C-Section, Low Transverse  Anticipated Birth Control:  Depo   04/04/2021 Fatima Blank, CNM

## 2021-04-02 NOTE — Anesthesia Preprocedure Evaluation (Signed)
Anesthesia Evaluation  Patient identified by MRN, date of birth, ID band Patient awake    Reviewed: Allergy & Precautions, Patient's Chart, lab work & pertinent test results  Airway Mallampati: II  TM Distance: >3 FB Neck ROM: Full    Dental  (+) Teeth Intact, Dental Advisory Given   Pulmonary neg pulmonary ROS,    Pulmonary exam normal breath sounds clear to auscultation       Cardiovascular hypertension, Normal cardiovascular exam Rhythm:Regular Rate:Normal     Neuro/Psych negative neurological ROS  negative psych ROS   GI/Hepatic negative GI ROS, Neg liver ROS,   Endo/Other  negative endocrine ROS  Renal/GU negative Renal ROS     Musculoskeletal negative musculoskeletal ROS (+)   Abdominal   Peds  Hematology     Anesthesia Other Findings   Reproductive/Obstetrics (+) Pregnancy PIH                             Anesthesia Physical  Anesthesia Plan  ASA: 2  Anesthesia Plan: Epidural   Post-op Pain Management:    Induction:   PONV Risk Score and Plan:   Airway Management Planned:   Additional Equipment:   Intra-op Plan:   Post-operative Plan:   Informed Consent: I have reviewed the patients History and Physical, chart, labs and discussed the procedure including the risks, benefits and alternatives for the proposed anesthesia with the patient or authorized representative who has indicated his/her understanding and acceptance.       Plan Discussed with: Anesthesiologist  Anesthesia Plan Comments: (Patient identified. Risks/Benefits/Options discussed with patient including but not limited to bleeding, infection, nerve damage, paralysis, failed block, incomplete pain control, headache, blood pressure changes, nausea, vomiting, reactions to medication both or allergic, itching and postpartum back pain. Confirmed with bedside nurse the patient's most recent platelet count.  Confirmed with patient that they are not currently taking any anticoagulation, have any bleeding history or any family history of bleeding disorders. Patient expressed understanding and wished to proceed. All questions were answered. )        Anesthesia Quick Evaluation

## 2021-04-02 NOTE — Anesthesia Postprocedure Evaluation (Signed)
Anesthesia Post Note  Patient: Debra Lucas  Procedure(s) Performed: CESAREAN SECTION     Patient location during evaluation: PACU Anesthesia Type: General Level of consciousness: sedated Pain management: pain level controlled Vital Signs Assessment: post-procedure vital signs reviewed and stable Respiratory status: spontaneous breathing and respiratory function stable Cardiovascular status: stable Postop Assessment: no apparent nausea or vomiting Anesthetic complications: no   No notable events documented.  Last Vitals:  Vitals:   04/02/21 2255 04/02/21 2256  BP:    Pulse:    Resp:    Temp:    SpO2: 99% 96%    Last Pain:  Vitals:   04/02/21 2215  TempSrc: Axillary  PainSc: 4    Pain Goal: Patients Stated Pain Goal: 2 (04/02/21 2137)                 [name]

## 2021-04-02 NOTE — Anesthesia Preprocedure Evaluation (Signed)
Anesthesia Evaluation  Patient identified by MRN, date of birth, ID band Patient awake    Reviewed: Allergy & Precautions, NPO status , Patient's Chart, lab work & pertinent test results  Airway Mallampati: II  TM Distance: >3 FB Neck ROM: Full    Dental no notable dental hx.    Pulmonary neg pulmonary ROS,    Pulmonary exam normal breath sounds clear to auscultation       Cardiovascular hypertension, Normal cardiovascular exam Rhythm:Regular Rate:Normal     Neuro/Psych negative neurological ROS  negative psych ROS   GI/Hepatic negative GI ROS, Neg liver ROS,   Endo/Other  obesity  Renal/GU negative Renal ROS  negative genitourinary   Musculoskeletal negative musculoskeletal ROS (+)   Abdominal   Peds negative pediatric ROS (+)  Hematology negative hematology ROS (+) anemia ,   Anesthesia Other Findings   Reproductive/Obstetrics (+) Pregnancy                             Anesthesia Physical Anesthesia Plan  ASA: 2 and emergent  Anesthesia Plan: General   Post-op Pain Management:    Induction: Intravenous and Rapid sequence  PONV Risk Score and Plan: 3 and Treatment may vary due to age or medical condition, Midazolam, Ondansetron and Dexamethasone  Airway Management Planned: Oral ETT and Video Laryngoscope Planned  Additional Equipment: None  Intra-op Plan:   Post-operative Plan: Extubation in OR  Informed Consent:   Plan Discussed with: Anesthesiologist and CRNA  Anesthesia Plan Comments: (Consent not signed due to emergent nature of case. Initially encountered patient for epidural and consented for epidural placement. Patient was 9.5cm and desired to push instead of getting an epidural since she was coping well. Per RN, patient's water broke. C-section called via Vocera stating breech presentation which was subsequently upgraded to a stat C-section. Plan for GETA/RSI.  Tanna Furry, MD  )        Anesthesia Quick Evaluation

## 2021-04-02 NOTE — Op Note (Signed)
Debra Lucas PROCEDURE DATE: 04/02/2021  PREOPERATIVE DIAGNOSES: Intrauterine pregnancy at [redacted]w[redacted]d weeks gestation;  face presentation, NRFHT  POSTOPERATIVE DIAGNOSES: The same  PROCEDURE: Primary Low Transverse Cesarean Section  SURGEON:  Dr. Milas Hock  ASSISTANT:  Dr. Evalina Field  ANESTHESIOLOGY TEAM: Anesthesiologist: [name], MD CRNA: Lenox Ahr, CRNA; Orlie Pollen, CRNA  INDICATIONS: Debra Lucas is a 25 y.o. [phone] at [redacted]w[redacted]d here for cesarean section secondary to the indications listed under preoperative diagnoses; please see preoperative note for further details.  The risks of surgery were briefly discussed given the nature of the emergency with the patient including but were not limited to: bleeding which may require transfusion or reoperation; infection which may require antibiotics; injury to bowel, bladder, ureters or other surrounding organs; injury to the fetus; need for additional procedures including hysterectomy in the event of a life-threatening hemorrhage; formation of adhesions; placental abnormalities wth subsequent pregnancies; incisional problems; thromboembolic phenomenon and other postoperative/anesthesia complications.  The patient concurred with the proposed plan, giving verbal written consent for the procedure.    FINDINGS:  Viable female infant in face presentation.  Apgars 9 and 10.  Amniotic fluid: clear.  Intact placenta, three vessel cord.  Normal uterus, fallopian tubes and ovaries bilaterally.  ANESTHESIA: general INTRAVENOUS FLUIDS: 2600 ml crystalloid,  ESTIMATED BLOOD LOSS: 768 ml via triton, and unable to weigh remaining on bed, but likely about another 500 cc. SPECIMENS: Placenta sent to L&D  COMPLICATIONS: None immediate  PROCEDURE IN DETAIL:  The patient preoperatively had sequential compression devices applied to her lower extremities and catheter was quickly place in the OR.  She was then taken to the operating  room emergently. She was then placed in a dorsal supine position with a leftward tilt, and prepped and draped in a sterile manner. General anesthesia was achieved.    A Pfannenstiel skin incision was made with scalpel and carried through to the underlying layer of fascia. The fascia was incised in the midline, and this incision was extended bluntly. The rectus muscles were separated in the midline and the peritoneum was entered bluntly.   The bladder blade was introduced into the abdominal cavity.  Attention was turned to the lower uterine segment where a low transverse hysterotomy was made with a scalpel and extended bilaterally bluntly.  The infant was successfully delivered, the cord was clamped and cut and the infant was handed over to the awaiting neonatology team. Uterine massage was then administered, and the placenta delivered intact with a three-vessel cord. The uterus was then cleared of clots and debris. She was given TXA. The hysterotomy was closed with 0 Vicryl in a running locked fashion, and an imbricating layer was also placed with 0 Vicryl.  2 Figure-of-eight 0 Vicryl serosal stitches were placed to help with hemostasis.  For poor tone, she was given a dose of methergine.  The pelvis was cleared of all clot and debris. Hemostasis was confirmed on all surfaces.  The retractors were removed.  The peritoneum was closed with a 2-0 Vicryl running stitch. The fascia was then closed using 0 Vicryl in a running fashion.  The subcutaneous layer was irrigated, was reapproximated with 2-0 plain gut in a running fashion. The skin was closed with a 4-0 Vicryl subcuticular stitch. Incision was dressed with honeycomb but pressure dressing also applied. The patient tolerated the procedure well under general anesthesia. Sponge, instrument and needle counts were correct x 3.  She was taken to the recovery room in  stable condition once awake and extubated from anesthesia.   She was hypotensive during the end  of the procedure during which time she was fluid resuscitated and improved. She will have coags and stat CBC in the PACU.

## 2021-04-02 NOTE — Transfer of Care (Signed)
Immediate Anesthesia Transfer of Care Note  Patient: Debra Lucas  Procedure(s) Performed: CESAREAN SECTION  Patient Location: PACU  Anesthesia Type:General  Level of Consciousness: awake, alert , oriented and patient cooperative  Airway & Oxygen Therapy: Patient Spontanous Breathing and Patient connected to face mask oxygen  Post-op Assessment: Report given to RN, Post -op Vital signs reviewed and stable and Patient moving all extremities X 4  Post vital signs: Reviewed and stable  Last Vitals:  Vitals Value Taken Time  BP 117/75 04/02/21 2300  Temp 36.4 C 04/02/21 2215  Pulse 99 04/02/21 2303  Resp 10 04/02/21 2303  SpO2 100 % 04/02/21 2303  Vitals shown include unvalidated device data.  Last Pain:  Vitals:   04/02/21 2215  TempSrc: Axillary  PainSc: 4       Patients Stated Pain Goal: 2 (04/02/21 2137)  Complications: No notable events documented.

## 2021-04-02 NOTE — Discharge Summary (Signed)
error 

## 2021-04-02 NOTE — H&P (Addendum)
OBSTETRIC ADMISSION HISTORY AND PHYSICAL  Debra Lucas is a 25 y.o. female G8P2002 with IUP at 3w3dby LMP presenting for IOL for polyhydramnios. She reports +FMs, No LOF, no VB, no blurry vision, headaches or peripheral edema, and RUQ pain.  She plans on breast feeding. She plans on Depo for birth control. 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 -Anemia  Past Medical History: Past Medical History:  Diagnosis Date   Hx of pre-eclampsia in prior pregnancy, currently pregnant 2017   Hypertension     Past Surgical History: Past Surgical History:  Procedure Laterality Date   ADENOIDECTOMY     TONSILLECTOMY      Obstetrical History: OB History     Gravida  3   Para  2   Term  2   Preterm      AB      Living  2      SAB      IAB      Ectopic      Multiple  0   Live Births  2           Social History Social History   Socioeconomic History   Marital status: Single    Spouse name: Not on file   Number of children: Not on file   Years of education: Not on file   Highest education level: Not on file  Occupational History   Not on file  Tobacco Use   Smoking status: Never   Smokeless tobacco: Never  Vaping Use   Vaping Use: Never used  Substance and Sexual Activity   Alcohol use: No   Drug use: No   Sexual activity: Yes    Partners: Male    Birth control/protection: None  Other Topics Concern   Not on file  Social History Narrative   Not on file   Social Determinants of Health   Financial Resource Strain: Not on file  Food Insecurity: Not on file  Transportation Needs: Not on  file  Physical Activity: Not on file  Stress: Not on file  Social Connections: Not on file    Family History: Family History  Problem Relation Age of Onset   Hypertension Maternal Grandmother    Healthy Mother    Healthy Father     Allergies: Allergies  Allergen Reactions   Shellfish Allergy Swelling and Rash    Medications Prior to Admission  Medication Sig Dispense Refill Last Dose   aspirin EC 81 MG tablet Take 1 tablet (81 mg total) by mouth daily. 60 tablet 2    Blood Pressure Monitoring (BLOOD PRESSURE KIT) DEVI 1 kit by Does not apply route once a week. 1 each 0    famotidine (PEPCID) 20 MG tablet Take 1 tablet (20 mg total) by mouth 2 (two) times daily. 60 tablet 5    Iron Polysacch Cmplx-B12-FA 150-0.025-1 MG CAPS Take 1 capsule by mouth every other day. 30 capsule 6    Prenatal Vit-Fe Fumarate-FA (PRENATAL COMPLETE) 14-0.4 MG TABS Take 1 each by mouth daily. 60 tablet 0      Review of Systems   All systems reviewed and negative except as stated  in HPI  Blood pressure 135/75, pulse 87, temperature 99.2 F (37.3 C), temperature source Oral, resp. rate 18, last menstrual period 06/30/2020. General appearance: alert, cooperative, and no distress Lungs: normal work of breathing on room air Heart: regular rate Abdomen: soft, non-tender Extremities: Homans sign is negative, no sign of DVT Presentation: cephalic by bedside ultrasound Fetal monitoringBaseline: 130 bpm, Variability: Good {> 6 bpm), and Accelerations: Reactive Uterine activityNone SVE: 1/thick/-3  Prenatal labs: ABO, Rh: O/Positive/-- (04/28 1426) Antibody: Negative (04/28 1426) Rubella: 1.89 (04/28 1426) RPR: Non Reactive (09/01 1119)  HBsAg: Negative (04/28 1426)  HIV: Non Reactive (09/01 1119)  GBS: Negative/-- (10/27 1109)  2 hr Glucola third trimester normal Genetic screening  NIPS low risk, increased risk for SMA and 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  Patient Active Problem List   Diagnosis Date Noted   Polyhydramnios affecting pregnancy in third trimester 02/01/2021   Encounter for supervision of normal pregnancy in first trimester 09/08/2020   History of group B Streptococcus (GBS) infection 07/16/2016   History of pre-eclampsia in prior pregnancy, currently pregnant 07/16/2016    Assessment/Plan:  Debra Lucas is a 25 y.o. G3P2002 at 22w3dhere for IOL for polyhydramnios.  #Labor: She presents for IOL for polyhydramnios. Cervix is 1 cm, thick, and station is -3. Induction methods were explained to patient and all questions were answered. Cytotec x 1 and foley bulb placed. #Pain: Epidural when the time comes #FWB: Moderate variability, category I tracing #ID:  GBS negative #MOF: Breast #MOC: Depo #Circ:  N/A (female)  JArcher Asa Medical Student  04/02/2021, 4:11 PM  Midwife attestation: I have seen and examined this patient; I agree with above documentation in the student's note.   PE: Gen: calm comfortable, NAD Resp: normal effort and rate Abd: gravid  ROS, labs, PMH reviewed  Assessment/Plan: [redacted] weeks gestation Labor: latent FWB: Cat I GBS: neg Admit to LD Cervical ripening Anticipate SVD  MJulianne Handler CNM  04/02/2021, 6:16 PM

## 2021-04-02 NOTE — Anesthesia Procedure Notes (Signed)
Procedure Name: Intubation Date/Time: 04/02/2021 8:43 PM Performed by: Orlie Pollen, CRNA Pre-anesthesia Checklist: Patient identified, Patient being monitored, Timeout performed, Emergency Drugs available and Suction available Patient Re-evaluated:Patient Re-evaluated prior to induction Oxygen Delivery Method: Circle System Utilized Preoxygenation: Pre-oxygenation with 100% oxygen Induction Type: IV induction Ventilation: Mask ventilation without difficulty Laryngoscope Size: 3 and Glidescope Grade View: Grade II Tube type: Oral Tube size: 7.0 mm Number of attempts: 1 Airway Equipment and Method: stylet Placement Confirmation: ETT inserted through vocal cords under direct vision, positive ETCO2 and breath sounds checked- equal and bilateral Secured at: 23 cm Tube secured with: Tape Dental Injury: Teeth and Oropharynx as per pre-operative assessment

## 2021-04-02 NOTE — Progress Notes (Signed)
Patient Vitals for the past 4 hrs:  BP Pulse Height Weight  04/02/21 1845 128/76 83 -- --  04/02/21 1612 -- -- 5\' 5"  (1.651 m) 98.9 kg   Feeling some pressure.  Cx 9/100/-1/BBOW.  Just got IV fentanyl, wants epidural.  Will go ahead and get epidural, then AROM in a little while if needed.

## 2021-04-03 ENCOUNTER — Encounter (HOSPITAL_COMMUNITY): Payer: Self-pay | Admitting: Obstetrics and Gynecology

## 2021-04-03 LAB — DIC (DISSEMINATED INTRAVASCULAR COAGULATION)PANEL
D-Dimer, Quant: 1.63 ug/mL-FEU — ABNORMAL HIGH (ref 0.00–0.50)
Fibrinogen: 374 mg/dL (ref 210–475)
INR: 1.1 (ref 0.8–1.2)
Platelets: 124 10*3/uL — ABNORMAL LOW (ref 150–400)
Prothrombin Time: 14.2 seconds (ref 11.4–15.2)
Smear Review: NONE SEEN
aPTT: 30 seconds (ref 24–36)

## 2021-04-03 LAB — CBC
HCT: 27 % — ABNORMAL LOW (ref 36.0–46.0)
Hemoglobin: 8.2 g/dL — ABNORMAL LOW (ref 12.0–15.0)
MCH: 22.6 pg — ABNORMAL LOW (ref 26.0–34.0)
MCHC: 30.4 g/dL (ref 30.0–36.0)
MCV: 74.4 fL — ABNORMAL LOW (ref 80.0–100.0)
Platelets: 135 10*3/uL — ABNORMAL LOW (ref 150–400)
RBC: 3.63 MIL/uL — ABNORMAL LOW (ref 3.87–5.11)
RDW: 14.8 % (ref 11.5–15.5)
WBC: 13 10*3/uL — ABNORMAL HIGH (ref 4.0–10.5)
nRBC: 0 % (ref 0.0–0.2)

## 2021-04-03 LAB — RPR: RPR Ser Ql: NONREACTIVE

## 2021-04-03 MED ORDER — WITCH HAZEL-GLYCERIN EX PADS: 1.0000 "application " | MEDICATED_PAD | CUTANEOUS | Status: DC | PRN

## 2021-04-03 MED ORDER — OXYCODONE HCL 5 MG PO TABS: 5.0000 mg | ORAL_TABLET | ORAL | Status: DC | PRN

## 2021-04-03 MED ORDER — KETOROLAC TROMETHAMINE 30 MG/ML IJ SOLN
30.0000 mg | Freq: Four times a day (QID) | INTRAMUSCULAR | Status: AC
  Administered 2021-04-03 (×3): 30 mg via INTRAVENOUS
  Filled 2021-04-03 (×3): qty 1

## 2021-04-03 MED ORDER — COCONUT OIL OIL: 1.0000 "application " | TOPICAL_OIL | Status: DC | PRN

## 2021-04-03 MED ORDER — SENNOSIDES-DOCUSATE SODIUM 8.6-50 MG PO TABS
2.0000 | ORAL_TABLET | Freq: Every day | ORAL | Status: DC
  Administered 2021-04-03 – 2021-04-04 (×2): 2 via ORAL
  Filled 2021-04-03 (×2): qty 2

## 2021-04-03 MED ORDER — PRENATAL MULTIVITAMIN CH
1.0000 | ORAL_TABLET | Freq: Every day | ORAL | Status: DC
  Administered 2021-04-03 – 2021-04-04 (×2): 1 via ORAL
  Filled 2021-04-03 (×2): qty 1

## 2021-04-03 MED ORDER — TETANUS-DIPHTH-ACELL PERTUSSIS 5-2.5-18.5 LF-MCG/0.5 IM SUSY: 0.5000 mL | PREFILLED_SYRINGE | Freq: Once | INTRAMUSCULAR | Status: DC

## 2021-04-03 MED ORDER — LACTATED RINGERS IV SOLN: INTRAVENOUS | Status: DC

## 2021-04-03 MED ORDER — IBUPROFEN 600 MG PO TABS
600.0000 mg | ORAL_TABLET | Freq: Four times a day (QID) | ORAL | Status: DC
  Administered 2021-04-03 – 2021-04-04 (×3): 600 mg via ORAL
  Filled 2021-04-03 (×3): qty 1

## 2021-04-03 MED ORDER — DIPHENHYDRAMINE HCL 25 MG PO CAPS: 25.0000 mg | ORAL_CAPSULE | Freq: Four times a day (QID) | ORAL | Status: DC | PRN

## 2021-04-03 MED ORDER — SODIUM CHLORIDE 0.9 % IV SOLN
500.0000 mg | Freq: Once | INTRAVENOUS | Status: AC
  Administered 2021-04-03: 500 mg via INTRAVENOUS
  Filled 2021-04-03: qty 25

## 2021-04-03 MED ORDER — OXYTOCIN-SODIUM CHLORIDE 30-0.9 UT/500ML-% IV SOLN: 2.5000 [IU]/h | INTRAVENOUS | Status: AC

## 2021-04-03 MED ORDER — MENTHOL 3 MG MT LOZG: 1.0000 | LOZENGE | OROMUCOSAL | Status: DC | PRN

## 2021-04-03 MED ORDER — MEASLES, MUMPS & RUBELLA VAC IJ SOLR: 0.5000 mL | Freq: Once | INTRAMUSCULAR | Status: DC

## 2021-04-03 MED ORDER — MEDROXYPROGESTERONE ACETATE 150 MG/ML IM SUSP: 150.0000 mg | INTRAMUSCULAR | Status: DC | PRN

## 2021-04-03 MED ORDER — SIMETHICONE 80 MG PO CHEW: 80.0000 mg | CHEWABLE_TABLET | ORAL | Status: DC | PRN

## 2021-04-03 MED ORDER — SIMETHICONE 80 MG PO CHEW
80.0000 mg | CHEWABLE_TABLET | Freq: Three times a day (TID) | ORAL | Status: DC
  Administered 2021-04-03 – 2021-04-04 (×4): 80 mg via ORAL
  Filled 2021-04-03 (×4): qty 1

## 2021-04-03 MED ORDER — ENOXAPARIN SODIUM 60 MG/0.6ML IJ SOSY
0.5000 mg/kg | PREFILLED_SYRINGE | INTRAMUSCULAR | Status: DC
  Administered 2021-04-03 – 2021-04-04 (×2): 50 mg via SUBCUTANEOUS
  Filled 2021-04-03 (×2): qty 0.6

## 2021-04-03 MED ORDER — DIBUCAINE (PERIANAL) 1 % EX OINT: 1.0000 "application " | TOPICAL_OINTMENT | CUTANEOUS | Status: DC | PRN

## 2021-04-03 NOTE — Progress Notes (Signed)
Post-Op Day 1, primary CS for face presentation /NRFTH  Subjective: No complaints, hasn't been up yet, foley still in; tolerating PO, hasn't started passing flatus,small lochia,.plans to breastfeed, Depo-Provera  Objective: Blood pressure 117/72, pulse 99, temperature 98.5 F (36.9 C), temperature source Oral, resp. rate 18, height 5\' 5"  (1.651 m), weight 98.9 kg, last menstrual period 06/30/2020, SpO2 100 %, unknown if currently breastfeeding.  Physical Exam:  General: alert, cooperative and no distress Lochia:normal flow Chest: CTAB Heart: RRR no m/r/g Abdomen: +BS, soft, nontender, dsg c/d/intact,  Uterine Fundus: firm DVT Evaluation: No evidence of DVT seen on physical exam. Extremities: trace edema  Recent Labs    04/02/21 2143 04/03/21 0556  HGB 9.8* 8.2*  HCT 33.1* 27.0*    Assessment/Plan: Anemia 2/2 blood loss, pregnancy>IV venofer Lactation consult   LOS: 1 day   04/05/21 04/03/2021, 7:33 AM

## 2021-04-03 NOTE — Lactation Note (Signed)
This note was copied from a baby's chart. Lactation Consultation Note  Patient Name: Debra Lucas Date: 04/03/2021 Reason for consult: Follow-up assessment Age:25 hours Lc order was not placed until today at 1120 am .  P 3 / 1st time breast feeding/ per mom + breast changes with pregnancy.  Baby awake after MBURN did her 3-11p assessment / LC offered to assist to latch / mom receptive.  1st LC assessed tongue mobility due to the [facility] mentioned she felt the baby had a tongue - tie. LC assessed and noted the skin notch under the mid section of the tongue was short / not tight, upper lip stretches with exam , skin notch just above the gum line.  2nd LC assessed breast tissue and noted some areola edema and with reverse pressure several times soften and was more compressible.  LC assisted mom to latch and after several attempts latched with depth and fed for 8 mins with swallows / baby fell asleep / released nipple slightly slanted.  LC Plan :  Breast shells between feedings except when sleeping or 10 mins prior to latch to stretch the nipple / areola complex.  Prior to latching breast massage, hand express, pre - pump if needed and reverse pressure until latching is easier.  Firm support for latch ( football worked well ) .  #27 F is a good fit.  Latch score 8  Maternal Data    Feeding Mother's Current Feeding Choice: Breast Milk and Formula  LATCH Score Latch: Repeated attempts needed to sustain latch, nipple held in mouth throughout feeding, stimulation needed to elicit sucking reflex.  Audible Swallowing: Spontaneous and intermittent  Type of Nipple: Everted at rest and after stimulation  Comfort (Breast/Nipple): Soft / non-tender  Hold (Positioning): Assistance needed to correctly position infant at breast and maintain latch.  LATCH Score: 8   Lactation Tools Discussed/Used    Interventions Interventions: Breast feeding basics reviewed;Assisted with  latch;Skin to skin;Breast massage;Hand express;Reverse pressure;Breast compression;Adjust position;Support pillows;Position options;Shells;Hand pump;Education;LC Services brochure  Discharge Pump: Personal;DEBP;Manual WIC Program: Yes  Consult Status Consult Status: Follow-up Date: 04/03/21 Follow-up type: In-patient    Matilde Sprang Patriciaann Rabanal 04/03/2021, 3:23 PM

## 2021-04-04 MED ORDER — ACETAMINOPHEN 500 MG PO TABS: 1000.0000 mg | ORAL_TABLET | Freq: Four times a day (QID) | ORAL | 2 refills | Status: AC | PRN

## 2021-04-04 MED ORDER — OXYCODONE HCL 5 MG PO TABS: 5.0000 mg | ORAL_TABLET | Freq: Four times a day (QID) | ORAL | 0 refills | Status: DC | PRN

## 2021-04-04 MED ORDER — IBUPROFEN 600 MG PO TABS: 600.0000 mg | ORAL_TABLET | Freq: Four times a day (QID) | ORAL | 0 refills | Status: DC

## 2021-04-04 NOTE — Lactation Note (Signed)
This note was copied from a baby's chart. Lactation Consultation Note  Patient Name: Girl Aerika Groll DXAJO'I Date: 04/04/2021 Reason for consult: Follow-up assessment;Term;Infant weight loss;Other (Comment) (3 % weight loss/ LC reviewed doc flow sheets with mom and updated.) Age:25 hours Since LC latch yesterday - per mom has attempted to latch, and baby has been getting bottles.  Per mom  has a DEBP at home - [facility] reviewed supply and demand/ importance of enhancing her milk to come in so when the baby latches it will be easier for the baby to stay in a pattern with feeding.  Yesterday's LC visit LC noted tongue mobility issues with a short skin notch under the mid section of the tongue. Baby did latch.  LC explained to mom it will be easier when there is more milk flow.  LC reviewed BF D/C teaching - see below.  Mom aware of the [facility] resources after D/C.   Maternal Data Has patient been taught Hand Expression?: Yes Does the patient have breastfeeding experience prior to this delivery?: No  Feeding Mother's Current Feeding Choice: Breast Milk and Formula Nipple Type: Slow - flow  LATCH Score                    Lactation Tools Discussed/Used    Interventions    Discharge Discharge Education: Engorgement and breast care;Outpatient recommendation;Other (comment) (LC recommended for mom when she is at her [facility] ( [facility] ) tomorrow to set up a LC O/P appt if desires.) Pump: Personal;DEBP;Manual (per mom a DEBP Spectra)  Consult Status Consult Status: Complete Date: 04/04/21    Matilde Sprang Arthuro Canelo 04/04/2021, 9:08 AM

## 2021-04-04 NOTE — Progress Notes (Signed)
Post Partum Day 2 Subjective: no complaints, up ad lib, voiding, tolerating PO, and + flatus Patient rates pain at 3/10. She state minimal vaginal bleeding. She states normal PO intake, passing flatus and BM. No issues with urination. Normal ambulation with minimal pain.   Objective: Blood pressure 97/65, pulse 87, temperature 97.9 F (36.6 C), temperature source Oral, resp. rate 17, height 5\' 5"  (1.651 m), weight 98.9 kg, last menstrual period 06/30/2020, SpO2 99 %, unknown if currently breastfeeding.  Physical Exam:  General: alert and cooperative Lochia: appropriate Uterine Fundus: firm Incision: healing well, no significant drainage, no dehiscence, no significant erythema DVT Evaluation: No evidence of DVT seen on physical exam. No cords or calf tenderness. No significant calf/ankle edema.  Recent Labs    04/02/21 2143 04/03/21 0556  HGB 9.8* 8.2*  HCT 33.1* 27.0*    Assessment/Plan: POD #2 from pLTCS for NRFHT, face presentation Normal BP throughout PP course Venofer IV on 04/03/21 for Hgb 8.2 Breastfeeding is going well  PP visits scheduled for incision check on 11/22 and PP on 12/14 at [facility] Patient requesting early discharge, will evaluate with OB team once Peds has rounded on baby   LOS: 2 days   [facility] 04/04/2021, 11:04 AM

## 2021-04-10 ENCOUNTER — Encounter: Payer: Self-pay | Admitting: Obstetrics & Gynecology

## 2021-04-10 ENCOUNTER — Ambulatory Visit (INDEPENDENT_AMBULATORY_CARE_PROVIDER_SITE_OTHER): Payer: Medicaid Other | Admitting: Obstetrics & Gynecology

## 2021-04-10 ENCOUNTER — Other Ambulatory Visit: Payer: Self-pay

## 2021-04-10 NOTE — Progress Notes (Signed)
Subjective:     Debra Lucas is a 25 y.o. female who presents to the clinic 8 days status post cesarean section. Eating a regular diet without difficulty. Bowel movements are normal. Pain is controlled with current analgesics. Medications being used: acetaminophen and ibuprofen (OTC).  The following portions of the patient's history were reviewed and updated as appropriate: allergies, current medications, past family history, past medical history, past social history, past surgical history, and problem list.  Review of Systems Pertinent items are noted in HPI.    Objective:    BP 127/81 (BP Location: Left Arm, Patient Position: Sitting, Cuff Size: Normal)   Pulse 99   Ht 5\' 5"  (1.651 m)   Wt 190 lb (86.2 kg)   LMP 06/30/2020   Breastfeeding Yes   BMI 31.62 kg/m  General:  alert, cooperative, and no distress  Abdomen: soft, bowel sounds active, non-tender  Incision:   healing well, no drainage, no erythema, no hernia, no seroma, no swelling, no dehiscence, incision well approximated     Assessment:    Doing well postoperatively. Operative findings again reviewed. Pathology report discussed.    Plan:    1. Continue any current medications. 2. Wound care discussed. 3. Activity restrictions: no lifting more than 15 pounds 4. Anticipated return to work: not applicable. 5. Follow up: 4 weeks   08/28/2020, MD 04/10/2021

## 2021-04-13 ENCOUNTER — Telehealth (HOSPITAL_COMMUNITY): Payer: Self-pay | Admitting: *Deleted

## 2021-04-13 NOTE — Telephone Encounter (Signed)
Mom reports feeling good. No concerns about herself at this time. Hospital EPDS=2. Call disconnected when EPDS started. Mom reports baby is doing well. Feeding, peeing, and pooping without difficulty. Safe sleep reviewed. Mom reports no concerns about baby at present.  Duffy Rhody, RN 04-13-2021 at 12:00pm

## 2021-05-02 ENCOUNTER — Ambulatory Visit (INDEPENDENT_AMBULATORY_CARE_PROVIDER_SITE_OTHER): Payer: Medicaid Other | Admitting: Obstetrics and Gynecology

## 2021-05-02 ENCOUNTER — Encounter: Payer: Self-pay | Admitting: Obstetrics and Gynecology

## 2021-05-02 ENCOUNTER — Other Ambulatory Visit: Payer: Self-pay

## 2021-05-02 DIAGNOSIS — Z98891 History of uterine scar from previous surgery: Secondary | ICD-10-CM | POA: Insufficient documentation

## 2021-05-02 MED ORDER — MEDROXYPROGESTERONE ACETATE 150 MG/ML IM SUSP
150.0000 mg | Freq: Once | INTRAMUSCULAR | Status: AC
  Administered 2021-05-02: 11:00:00 150 mg via INTRAMUSCULAR

## 2021-05-02 NOTE — Patient Instructions (Signed)

## 2021-05-02 NOTE — Progress Notes (Signed)
Depo given RD without difficulty Next Depo due 3 months, pt agrees. Administrations This Visit     medroxyPROGESTERone (DEPO-PROVERA) injection 150 mg     Admin Date 05/02/2021 Action Given Dose 150 mg Route Intramuscular Administered By Lewayne Bunting, CMA

## 2021-05-02 NOTE — Progress Notes (Signed)
° ° °  Post Partum Visit Note  Debra Lucas is a 25 y.o. [id number] female who presents for a postpartum visit. She is 4 weeks postpartum following a primary cesarean section.  I have fully reviewed the prenatal and intrapartum course. The delivery was at 39.3 gestational weeks.  Anesthesia: general. Postpartum course has been unremakable. Baby is doing well: yes. Baby is feeding by breast. Bleeding staining only. Bowel function is normal. Bladder function is normal. Patient is not sexually active. Contraception method is Depo-Provera injections. Postpartum depression screening: negative.   The pregnancy intention screening data noted above was reviewed. Potential methods of contraception were discussed. The patient elected to proceed with No data recorded.    Health Maintenance Due  Topic Date Due   HPV VACCINES (1 - 2-dose series) Never done   COVID-19 Vaccine (3 - Booster for Pfizer series) 05/25/2020    Medical Record  Review of Systems Pertinent items are noted in HPI.  Objective:  LMP 06/30/2020    General:  alert   Breasts:  not indicated  Lungs: clear to auscultation bilaterally  Heart:  regular rate and rhythm, S1, S2 normal, no murmur, click, rub or gallop  Abdomen: soft, non-tender; bowel sounds normal; no masses,  no organomegaly   Wound well approximated incision  GU exam:  not indicated       Assessment:    There are no diagnoses linked to this encounter.  NL postpartum exam.   Plan:   Essential components of care per ACOG recommendations:  1.  Mood and well being: Patient with negative depression screening today. Reviewed local resources for support.  - Patient tobacco use? No.   - hx of drug use? No.    2. Infant care and feeding:  -Patient currently breastmilk feeding? Yes  -Social determinants of health (SDOH) reviewed in EPIC. No concerns  3. Sexuality, contraception and birth spacing - Patient does not want a pregnancy in the next year.  Desired  family size is uncertain. - Reviewed forms of contraception in tiered fashion. Patient desired Depo-Provera today.   - Discussed birth spacing of 18 months  4. Sleep and fatigue -Encouraged family/partner/community support of 4 hrs of uninterrupted sleep to help with mood and fatigue  5. Physical Recovery  - Discussed patients delivery and complications. She describes her labor as good. - Patient had a C-section emergent. - Patient has urinary incontinence? No. - Patient is safe to resume physical and sexual activity  6.  Health Maintenance - HM due items addressed Yes - Last pap smear  Diagnosis  Date Value Ref Range Status  04/20/2020   Final   - Negative for intraepithelial lesion or malignancy (NILM)   Pap smear not done at today's visit.  -Breast Cancer screening indicated? No.   7. Chronic Disease/Pregnancy Condition follow up: None  - PCP follow up  Nettie Elm, MD Center for [facility], [facility]

## 2021-07-23 ENCOUNTER — Ambulatory Visit: Payer: Medicaid Other

## 2021-11-16 ENCOUNTER — Ambulatory Visit: Payer: Medicaid Other | Admitting: Family Medicine

## 2021-11-28 ENCOUNTER — Ambulatory Visit: Payer: Medicaid Other | Admitting: Family Medicine

## 2022-01-08 ENCOUNTER — Ambulatory Visit: Payer: Medicaid Other | Admitting: Family Medicine

## 2022-01-14 NOTE — Progress Notes (Deleted)
  Subjective:    Debra Lucas - 26 y.o. female MRN [id number]  Date of birth: 1995/06/28  HPI  Debra Lucas is to establish care.   Current issues and/or concerns:  ROS per HPI     Health Maintenance:  Health Maintenance Due  Topic Date Due   HPV VACCINES (1 - 2-dose series) Never done   COVID-19 Vaccine (3 - Pfizer series) 05/25/2020   INFLUENZA VACCINE  12/18/2021     Past Medical History: Patient Active Problem List   Diagnosis Date Noted   Postpartum care following cesarean delivery 05/02/2021   History of group B Streptococcus (GBS) infection 07/16/2016   History of pre-eclampsia in prior pregnancy, currently pregnant 07/16/2016      Social History   reports that she has never smoked. She has never used smokeless tobacco. She reports that she does not drink alcohol and does not use drugs.   Family History  family history includes Healthy in her father and mother; Hypertension in her maternal grandmother.   Medications: reviewed and updated   Objective:   Physical Exam There were no vitals taken for this visit. Physical Exam      Assessment & Plan:         Patient was given clear instructions to go to Emergency Department or return to medical center if symptoms don't improve, worsen, or new problems develop.The patient verbalized understanding.  I discussed the assessment and treatment plan with the patient. The patient was provided an opportunity to ask questions and all were answered. The patient agreed with the plan and demonstrated an understanding of the instructions.   The patient was advised to call back or seek an in-person evaluation if the symptoms worsen or if the condition fails to improve as anticipated.    Ricky Stabs, NP 01/14/2022, 12:43 PM Primary Care at [facility]

## 2022-01-15 ENCOUNTER — Inpatient Hospital Stay (HOSPITAL_COMMUNITY)
Admission: AD | Admit: 2022-01-15 | Discharge: 2022-01-15 | Disposition: A | Discharge status: Home / Self Care | Payer: Medicaid Other | Attending: Obstetrics & Gynecology | Admitting: Obstetrics & Gynecology

## 2022-01-15 ENCOUNTER — Inpatient Hospital Stay (HOSPITAL_COMMUNITY): Payer: Medicaid Other

## 2022-01-15 ENCOUNTER — Encounter (HOSPITAL_COMMUNITY): Payer: Self-pay

## 2022-01-15 DIAGNOSIS — O99331 Smoking (tobacco) complicating pregnancy, first trimester: Secondary | ICD-10-CM | POA: Insufficient documentation

## 2022-01-15 DIAGNOSIS — Z3481 Encounter for supervision of other normal pregnancy, first trimester: Secondary | ICD-10-CM | POA: Diagnosis not present

## 2022-01-15 DIAGNOSIS — O09291 Supervision of pregnancy with other poor reproductive or obstetric history, first trimester: Secondary | ICD-10-CM | POA: Insufficient documentation

## 2022-01-15 DIAGNOSIS — O418X10 Other specified disorders of amniotic fluid and membranes, first trimester, not applicable or unspecified: Secondary | ICD-10-CM

## 2022-01-15 DIAGNOSIS — N939 Abnormal uterine and vaginal bleeding, unspecified: Secondary | ICD-10-CM

## 2022-01-15 DIAGNOSIS — O10911 Unspecified pre-existing hypertension complicating pregnancy, first trimester: Secondary | ICD-10-CM | POA: Diagnosis not present

## 2022-01-15 DIAGNOSIS — O209 Hemorrhage in early pregnancy, unspecified: Secondary | ICD-10-CM | POA: Diagnosis not present

## 2022-01-15 DIAGNOSIS — Z3A09 9 weeks gestation of pregnancy: Secondary | ICD-10-CM

## 2022-01-15 DIAGNOSIS — O468X1 Other antepartum hemorrhage, first trimester: Secondary | ICD-10-CM

## 2022-01-15 DIAGNOSIS — Z3A10 10 weeks gestation of pregnancy: Secondary | ICD-10-CM | POA: Diagnosis not present

## 2022-01-15 LAB — WET PREP, GENITAL
Clue Cells Wet Prep HPF POC: NONE SEEN
Sperm: NONE SEEN
Trich, Wet Prep: NONE SEEN
WBC, Wet Prep HPF POC: <10 (ref <10)
Yeast Wet Prep HPF POC: NONE SEEN

## 2022-01-15 LAB — CBC
HCT: 37.9 % (ref 36.0–46.0)
Hemoglobin: 12 g/dL (ref 12.0–15.0)
MCH: 22.4 pg — ABNORMAL LOW (ref 26.0–34.0)
MCHC: 31.7 g/dL (ref 30.0–36.0)
MCV: 70.7 fL — ABNORMAL LOW (ref 80.0–100.0)
Platelets: 176 10*3/uL (ref 150–400)
RBC: 5.36 MIL/uL — ABNORMAL HIGH (ref 3.87–5.11)
RDW: 16.4 % — ABNORMAL HIGH (ref 11.5–15.5)
WBC: 5.3 10*3/uL (ref 4.0–10.5)
nRBC: 0 % (ref 0.0–0.2)

## 2022-01-15 LAB — ABO/RH: ABO/RH(D): O POS

## 2022-01-15 LAB — HCG, QUANTITATIVE, PREGNANCY: hCG, Beta Chain, Quant, S: 92662 m[IU]/mL — ABNORMAL HIGH (ref <5)

## 2022-01-15 NOTE — Discharge Instructions (Signed)
[facility] [place] Address: [address], [place], [place] [address] Hours:  Monday 9AM-5PM Tuesday 10AM-6PM Wednesday 11AM-7PM Thursday 9AM-5PM Friday              8AM-2PM Saturday Sunday  Phone: [phone]  

## 2022-01-15 NOTE — MAU Note (Signed)
.  Debra Lucas is a 26 y.o. at [redacted]w[redacted]d here in MAU reporting: heaving vaginal bleeding that started around 0900 this morning when she got up from the toilet. She also passed a few large clots and saturated through one pad. [facility] Also reporting lower abdominal cramping.   Pain score: 4 Vitals:   01/15/22 0938  BP: 112/75  Pulse: 96  Resp: 14  Temp: 98.7 F (37.1 C)  SpO2: 100%      Lab orders placed from triage:  UA

## 2022-01-15 NOTE — MAU Provider Note (Signed)
History     CSN: [id number]  Arrival date and time: 01/15/22 2297   Event Date/Time   First Provider Initiated Contact with Patient 01/15/22 1007      Chief Complaint  Patient presents with   Vaginal Bleeding   Debra Lucas, a  26 y.o. [phone] at 18w0dpresents to MAU with complaints of Light spotting that started last Thursday. Patient states this Morning bleeding became heavier, bright red and passing clots, starting about 1 hour ago. Patient states since passing 1 large clot, bleeding has slowed. She also endorses some mild pelvic period-like cramping currently rating 4/10. Being somewhat relieved with warm heating pads.  Denies complaints of nausea vomiting, cinstipation diahrrhea, other vaginal or urinary symptoms. Patient does not desire to continue with pregnancy.     OB History     Gravida  4   Para  3   Term  3   Preterm      AB      Living  3      SAB      IAB      Ectopic      Multiple  0   Live Births  3           Past Medical History:  Diagnosis Date   Hx of pre-eclampsia in prior pregnancy, currently pregnant 2017   Hypertension     Past Surgical History:  Procedure Laterality Date   ADENOIDECTOMY     CESAREAN SECTION N/A 04/02/2021   Procedure: CESAREAN SECTION;  Surgeon: DRadene Gunning MD;  Location: [facility] LD ORS;  Service: Obstetrics;  Laterality: N/A;   TONSILLECTOMY      Family History  Problem Relation Age of Onset   Hypertension Maternal Grandmother    Healthy Mother    Healthy Father     Social History   Tobacco Use   Smoking status: Never   Smokeless tobacco: Never  Vaping Use   Vaping Use: Never used  Substance Use Topics   Alcohol use: No   Drug use: No    Allergies:  Allergies  Allergen Reactions   Shellfish Allergy Swelling and Rash    Medications Prior to Admission  Medication Sig Dispense Refill Last Dose   acetaminophen (TYLENOL) 500 MG tablet Take 2 tablets (1,000 mg total) by mouth every 6  (six) hours as needed. 100 tablet 2    Blood Pressure Monitoring (BLOOD PRESSURE KIT) DEVI 1 kit by Does not apply route once a week. 1 each 0    ibuprofen (ADVIL) 600 MG tablet Take 1 tablet (600 mg total) by mouth every 6 (six) hours. 30 tablet 0    Prenatal Vit-Fe Fumarate-FA (PRENATAL COMPLETE) 14-0.4 MG TABS Take 1 each by mouth daily. 60 tablet 0     Review of Systems  Constitutional:  Negative for chills, fatigue and fever.  Eyes:  Negative for pain and visual disturbance.  Respiratory:  Negative for apnea, shortness of breath and wheezing.   Cardiovascular:  Negative for chest pain and palpitations.  Gastrointestinal:  Positive for abdominal pain, nausea and vomiting. Negative for constipation and diarrhea.  Genitourinary:  Positive for vaginal bleeding and vaginal pain. Negative for difficulty urinating, dysuria and pelvic pain.  Musculoskeletal:  Positive for back pain.  Neurological:  Negative for seizures, weakness and headaches.  Psychiatric/Behavioral:  Negative for suicidal ideas.    Physical Exam   Blood pressure 112/75, pulse 96, temperature 98.7 F (37.1 C), temperature source Oral, resp. rate 14, height 5'  5" (1.651 m), weight 89.8 kg, SpO2 100 %, currently breastfeeding.  Physical Exam Vitals and nursing note reviewed.  Constitutional:      General: She is not in acute distress.    Appearance: Normal appearance.  HENT:     Head: Normocephalic.  Cardiovascular:     Rate and Rhythm: Normal rate and regular rhythm.     Heart sounds: Normal heart sounds.  Pulmonary:     Effort: Pulmonary effort is normal.     Breath sounds: Normal breath sounds.  Abdominal:     Palpations: Abdomen is soft.     Tenderness: There is abdominal tenderness.  Musculoskeletal:        General: Normal range of motion.     Cervical back: Normal range of motion.  Skin:    General: Skin is warm and dry.  Neurological:     Mental Status: She is alert and oriented to person, place, and  time.  Psychiatric:        Mood and Affect: Mood normal.        Behavior: Behavior normal.     MAU Course  Procedures Orders Placed This Encounter  Procedures   Wet prep, genital   US OB LESS THAN 14 WEEKS WITH OB TRANSVAGINAL   Urinalysis, Routine w reflex microscopic Urine, Clean Catch   CBC   hCG, quantitative, pregnancy   Diet NPO time specified   ABO/Rh   Results for orders placed or performed during the hospital encounter of 01/15/22 (from the past 72 hour(s))  CBC     Status: Abnormal   Collection Time: 01/15/22 10:19 AM  Result Value Ref Range   WBC 5.3 4.0 - 10.5 K/uL   RBC 5.36 (H) 3.87 - 5.11 MIL/uL   Hemoglobin 12.0 12.0 - 15.0 g/dL   HCT 37.9 36.0 - 46.0 %   MCV 70.7 (L) 80.0 - 100.0 fL   MCH 22.4 (L) 26.0 - 34.0 pg   MCHC 31.7 30.0 - 36.0 g/dL   RDW 16.4 (H) 11.5 - 15.5 %   Platelets 176 150 - 400 K/uL    Comment: REPEATED TO VERIFY   nRBC 0.0 0.0 - 0.2 %    Comment: Performed at [facility], [place] [address], [place], [place] [address]  ABO/Rh     Status: None   Collection Time: 01/15/22 10:19 AM  Result Value Ref Range   ABO/RH(D) O POS    No rh immune globuloin      NOT A RH IMMUNE GLOBULIN CANDIDATE, PT RH POSITIVE Performed at [place] [address], [place],  [address]   hCG, quantitative, pregnancy     Status: Abnormal   Collection Time: 01/15/22 10:19 AM  Result Value Ref Range   hCG, Beta Chain, Quant, S 92,662 (H) <5 mIU/mL    Comment:          GEST. AGE      CONC.  (mIU/mL)   <=1 WEEK        5 - 50     2 WEEKS       50 - 500     3 WEEKS       100 - 10,000     4 WEEKS     1,000 - 30,000     5 WEEKS     3,500 - 115,000   6-8 WEEKS     12,000 - 270,000    12 WEEKS     15,000 - 220,000  FEMALE AND NON-PREGNANT FEMALE:     LESS THAN 5 mIU/mL Performed at [facility], [place] [address] [place], [place] [address]   Wet prep, genital     Status: None   Collection Time: 01/15/22 10:26 AM   Specimen: PATH  Cytology Cervicovaginal Ancillary Only  Result Value Ref Range   Yeast Wet Prep HPF POC NONE SEEN NONE SEEN   Trich, Wet Prep NONE SEEN NONE SEEN   Clue Cells Wet Prep HPF POC NONE SEEN NONE SEEN   WBC, Wet Prep HPF POC <10 <10   Sperm NONE SEEN     Comment: Performed at [facility], [place] [address], [place], [place] [address]   [place] Comp Less 14 Wks  Result Date: 01/15/2022 CLINICAL DATA:  Vaginal bleeding in first trimester of pregnancy, LMP 08/13/2021 EXAM: OBSTETRIC <14 WK ULTRASOUND TECHNIQUE: Transabdominal ultrasound was performed for evaluation of the gestation as well as the maternal uterus and adnexal regions. COMPARISON:  None for this gestation FINDINGS: Intrauterine gestational sac: Present, single Yolk sac:  Present Embryo:  Present Cardiac Activity: Present Heart Rate: 176 bpm CRL:   30.8 mm   10 w 0 d                  [place] EDC: 08/13/2022 Subchorionic hemorrhage:  Large subchronic hemorrhage Maternal uterus/adnexae: Subchronic mass 3.0 x 2.2 x 3.2 cm suspicious for uterine leiomyoma. RIGHT ovary not visualized. LEFT ovary normal size and morphology. No free pelvic fluid or adnexal masses. IMPRESSION: Single live intrauterine gestation at 10 weeks 0 days EGA by crown-rump length. Large subchronic hemorrhage. 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 Electronically Signed   By: Lavonia Dana M.D.   On: 01/15/2022 11:07    MDM Lab results reviewed and interpreted by me.   Wet Prep Normal Hbg 12.0, Platelet count 176. Both normal  Patient bleeding has significantly slowed "per patient" since arrival to MAU [place] Results Revealed a Live IUP measuring [redacted]w[redacted]d Large Subchorionic hemorrhage noted on [place]  Plan for Discharge   Assessment and Plan   1. Supervision of normal intrauterine pregnancy in multigravida in first trimester   2. Vaginal bleeding   3. Subchorionic hemorrhage of placenta in first trimester, single or unspecified fetus   4. [redacted] weeks gestation of pregnancy    - Discussed  that bleeding is likely associated with large subchorionic hemorrhage noted from Ultrasound.  [facility] expectations and precautions reviewed at bedside.  - Worsening signs and return precautions also reviewed.  - Pregnancy verification for termination provided and a list of resources provided.  - Patient discharged home in stable condition and may return to MAU as needed.   SJacquiline Doe MSN CNM  01/15/2022, 10:07 AM

## 2022-01-16 LAB — GC/CHLAMYDIA PROBE AMP (~~LOC~~) NOT AT ARMC
Chlamydia: NEGATIVE
Comment: NEGATIVE
Comment: NORMAL
Neisseria Gonorrhea: NEGATIVE

## 2022-01-18 ENCOUNTER — Inpatient Hospital Stay (HOSPITAL_COMMUNITY)
Admission: AD | Admit: 2022-01-18 | Discharge: 2022-01-18 | Disposition: A | Discharge status: Home / Self Care | Payer: Medicaid Other | Attending: Obstetrics and Gynecology | Admitting: Obstetrics and Gynecology

## 2022-01-18 ENCOUNTER — Inpatient Hospital Stay (HOSPITAL_COMMUNITY): Payer: Medicaid Other

## 2022-01-18 DIAGNOSIS — O208 Other hemorrhage in early pregnancy: Secondary | ICD-10-CM | POA: Insufficient documentation

## 2022-01-18 DIAGNOSIS — Z3A10 10 weeks gestation of pregnancy: Secondary | ICD-10-CM | POA: Insufficient documentation

## 2022-01-18 DIAGNOSIS — O10911 Unspecified pre-existing hypertension complicating pregnancy, first trimester: Secondary | ICD-10-CM | POA: Insufficient documentation

## 2022-01-18 DIAGNOSIS — O418X10 Other specified disorders of amniotic fluid and membranes, first trimester, not applicable or unspecified: Secondary | ICD-10-CM

## 2022-01-18 DIAGNOSIS — Z3490 Encounter for supervision of normal pregnancy, unspecified, unspecified trimester: Secondary | ICD-10-CM

## 2022-01-18 DIAGNOSIS — O209 Hemorrhage in early pregnancy, unspecified: Secondary | ICD-10-CM | POA: Diagnosis not present

## 2022-01-18 DIAGNOSIS — Z6740 Type O blood, Rh positive: Secondary | ICD-10-CM

## 2022-01-18 NOTE — Discharge Instructions (Signed)
Prenatal Care Providers           [facility]  [address] [phone]  [facility]   [address]  [phone]  [facility] [place]       [address] [phone]            [facility] [place]     [address] [phone]          [facility] [place]   [address] [phone]  [facility]  [address] [phone]     [facility]  [address]   [phone]     [facility]  Phone: [phone]  [facility]  Phone: [phone]  [facility] Phone: [phone]  [facility] Phone: [phone]  [facility] Phone: [phone]  [facility] Phone: [phone]  [facility]  Phone: [phone]  

## 2022-01-18 NOTE — MAU Note (Signed)
.  Debra Lucas is a 26 y.o. at [redacted]w[redacted]d here in MAU reporting: her vaginal bleeding is continuing like it was the other day when she was here. C/o sharp abd pain as well. Stated she has changed her pad 3 times since she has gotten here in MAU. LMP:  Onset of complaint:  Pain score: 8 Vitals:   01/18/22 1755  BP: 119/71  Pulse: (!) 103  Resp: 18  Temp: 98.4 F (36.9 C)     FHT:n/a Lab orders placed from triage:

## 2022-01-18 NOTE — MAU Provider Note (Signed)
History     CSN: [id number]  Arrival date and time: 01/18/22 1643   None     Chief Complaint  Patient presents with   Vaginal Bleeding   Debra Lucas is a 26 y.o. G4P3003 at [redacted]w[redacted]d who presents today with vaginal bleeding. Patient was seen on 01/15/2022 with vaginal bleeding and diagnosed with subchorionic hematoma.   Vaginal Bleeding The patient's primary symptoms include vaginal bleeding. This is a new problem. The current episode started in the past 7 days. The problem occurs constantly. The problem has been gradually improving. The pain is mild. The problem affects both sides. She is pregnant. The vaginal discharge was bloody. The vaginal bleeding is heavier than menses. She has been passing clots. She has not been passing tissue. Nothing aggravates the symptoms. She has tried nothing for the symptoms.    OB History     Gravida  4   Para  3   Term  3   Preterm      AB      Living  3      SAB      IAB      Ectopic      Multiple  0   Live Births  3           Past Medical History:  Diagnosis Date   Hx of pre-eclampsia in prior pregnancy, currently pregnant 2017   Hypertension     Past Surgical History:  Procedure Laterality Date   ADENOIDECTOMY     CESAREAN SECTION N/A 04/02/2021   Procedure: CESAREAN SECTION;  Surgeon: Milas Hock, MD;  [facility]: [facility];  Service: Obstetrics;  Laterality: N/A;   TONSILLECTOMY      Family History  Problem Relation Age of Onset   Hypertension Maternal Grandmother    Healthy Mother    Healthy Father     Social History   Tobacco Use   Smoking status: Never   Smokeless tobacco: Never  Vaping Use   Vaping Use: Never used  Substance Use Topics   Alcohol use: No   Drug use: No    Allergies:  Allergies  Allergen Reactions   Shellfish Allergy Swelling and Rash    No medications prior to admission.    Review of Systems  Genitourinary:  Positive for vaginal bleeding.  All other systems  reviewed and are negative.  Physical Exam   Blood pressure 119/71, pulse (!) 103, temperature 98.4 F (36.9 C), resp. rate 18, height 5\' 5"  (1.651 m), weight 88 kg, currently breastfeeding.  Physical Exam Constitutional:      Appearance: She is well-developed.  HENT:     Head: Normocephalic.  Eyes:     Pupils: Pupils are equal, round, and reactive to light.  Cardiovascular:     Rate and Rhythm: Normal rate and regular rhythm.     Heart sounds: Normal heart sounds.  Pulmonary:     Effort: Pulmonary effort is normal. No respiratory distress.     Breath sounds: Normal breath sounds.  Abdominal:     Palpations: Abdomen is soft.     Tenderness: There is no abdominal tenderness.  Genitourinary:    Vagina: No bleeding. Vaginal discharge: mucusy.    Comments: External: no lesion Vagina: small amount of white discharge     Musculoskeletal:        General: Normal range of motion.     Cervical back: Normal range of motion and neck supple.  Skin:    General: Skin is  warm and dry.  Neurological:     Mental Status: She is alert and oriented to person, place, and time.  Psychiatric:        Mood and Affect: Mood normal.        Behavior: Behavior normal.     US OB Comp Less 14 Wks  Result Date: 01/18/2022 CLINICAL DATA:  Nonenlarged subchorionic hemorrhage, bleeding x4 days. Beta HCG of 92,662 on August 29 EXAM: OBSTETRIC <14 WK [place] AND TRANSVAGINAL OB US TECHNIQUE: Both transabdominal and transvaginal ultrasound examinations were performed for complete evaluation of the gestation as well as the maternal uterus, adnexal regions, and pelvic cul-de-sac. Transvaginal technique was performed to assess early pregnancy. COMPARISON:  Ultrasound January 15, 2022. FINDINGS: Intrauterine gestational sac: Single Yolk sac:  Not Visualized. Embryo:  Visualized. Cardiac Activity: Visualized. Heart Rate: 198 bpm CRL:  41.2 mm   11 w   0 d                  [place] EDC: August 09, 2022 Subchorionic hemorrhage:  Large volume subchorionic hemorrhage measuring approximately 4.5 x 2.8 x 1.7 cm. Maternal uterus/adnexae: Limited evaluation reveals no acute findings. IMPRESSION: 1. Single live intrauterine pregnancy at 11 weeks and 0 days. 2. Large subchorionic hemorrhage again seen. 3. Persistent fetal tachycardia. Electronically Signed   By: Maudry Mayhew M.D.   On: 01/18/2022 18:36     MAU Course  Procedures  MDM   Assessment and Plan   1. Vaginal bleeding in pregnancy, first trimester   2. [redacted] weeks gestation of pregnancy   3. Intrauterine pregnancy   4. Subchorionic hematoma in first trimester, single or unspecified fetus   5. Type O blood, Rh positive    DC home in stable condition  Comfort measures reviewed  1st Trimester precautions  Bleeding precautions RX: none  Return to MAU as needed FU with OB as planned   Follow-up Information     Department, [facility] Follow up.   Contact information: [address] [place] [address] [phone]                Thressa Sheller DNP, CNM  01/18/22  8:08 PM

## 2022-01-22 ENCOUNTER — Encounter: Payer: Self-pay | Admitting: General Practice

## 2022-01-25 ENCOUNTER — Ambulatory Visit: Payer: Medicaid Other | Admitting: Family

## 2022-01-25 DIAGNOSIS — Z7689 Persons encountering health services in other specified circumstances: Secondary | ICD-10-CM

## 2022-01-29 ENCOUNTER — Inpatient Hospital Stay (HOSPITAL_COMMUNITY): Payer: Medicaid Other

## 2022-01-29 ENCOUNTER — Inpatient Hospital Stay (HOSPITAL_COMMUNITY)
Admission: AD | Admit: 2022-01-29 | Discharge: 2022-01-29 | Disposition: A | Discharge status: Home / Self Care | Payer: Medicaid Other | Attending: Family Medicine | Admitting: Family Medicine

## 2022-01-29 ENCOUNTER — Other Ambulatory Visit: Payer: Self-pay

## 2022-01-29 ENCOUNTER — Encounter (HOSPITAL_COMMUNITY): Payer: Self-pay | Admitting: Family Medicine

## 2022-01-29 DIAGNOSIS — O074 Failed attempted termination of pregnancy without complication: Secondary | ICD-10-CM | POA: Diagnosis not present

## 2022-01-29 DIAGNOSIS — O071 Delayed or excessive hemorrhage following failed attempted termination of pregnancy: Secondary | ICD-10-CM | POA: Diagnosis present

## 2022-01-29 LAB — CBC
HCT: 36 % (ref 36.0–46.0)
Hemoglobin: 11.4 g/dL — ABNORMAL LOW (ref 12.0–15.0)
MCH: 22.9 pg — ABNORMAL LOW (ref 26.0–34.0)
MCHC: 31.7 g/dL (ref 30.0–36.0)
MCV: 72.3 fL — ABNORMAL LOW (ref 80.0–100.0)
Platelets: 214 10*3/uL (ref 150–400)
RBC: 4.98 MIL/uL (ref 3.87–5.11)
RDW: 16.1 % — ABNORMAL HIGH (ref 11.5–15.5)
WBC: 5.9 10*3/uL (ref 4.0–10.5)
nRBC: 0 % (ref 0.0–0.2)

## 2022-01-29 LAB — TYPE AND SCREEN
ABO/RH(D): O POS
Antibody Screen: NEGATIVE

## 2022-01-29 MED ORDER — METHYLERGONOVINE MALEATE 0.2 MG/ML IJ SOLN
INTRAMUSCULAR | Status: AC
  Filled 2022-01-29: qty 1

## 2022-01-29 MED ORDER — LACTATED RINGERS IV BOLUS
1000.0000 mL | Freq: Once | INTRAVENOUS | Status: AC
  Administered 2022-01-29: 1000 mL via INTRAVENOUS

## 2022-01-29 MED ORDER — MISOPROSTOL 200 MCG PO TABS
1000.0000 ug | ORAL_TABLET | Freq: Once | ORAL | Status: AC
  Administered 2022-01-29: 1000 ug via BUCCAL
  Filled 2022-01-29: qty 5

## 2022-01-29 MED ORDER — METHYLERGONOVINE MALEATE 0.2 MG/ML IJ SOLN
0.2000 mg | Freq: Once | INTRAMUSCULAR | Status: AC
  Administered 2022-01-29: 0.2 mg via INTRAMUSCULAR

## 2022-01-29 MED ORDER — KETOROLAC TROMETHAMINE 30 MG/ML IJ SOLN
30.0000 mg | Freq: Once | INTRAMUSCULAR | Status: AC
Start: 2022-01-29 — End: 2022-01-29
  Administered 2022-01-29: 30 mg via INTRAVENOUS
  Filled 2022-01-29: qty 1

## 2022-01-29 NOTE — MAU Provider Note (Cosign Needed Addendum)
Chief Complaint:  Vaginal Bleeding   None    HPI: Debra Lucas is a 26 y.o. [id number] at 10w6dwho presents to maternity admissions reporting heavy vaginal bleeding with clots since 0415 this morning (likely started earlier). Pt is s/p surgical abortion at [facility] on 01/22/22. Has only had mild to moderate vaginal bleeding since "akin to period flow". Has been taking ibuprofen every morning for the cramps, last took it yesterday morning. This morning her alarm went off and she felt clots and gushes of blood soaking her sheets and pajama pants. Took a shower and it slowed down to her normal flow. Put a pad on and went to work (for [employer]. While taking the trash out just before arriving to MAU, she felt another large gush and several clots come out - soaked her pants and got on the floor. Bled on the floor in the MAU triage then into the room. Having some cramping, no other physical complaints. Specifically denies dizziness, light-headedness, nausea/vomiting or fever.  Pregnancy Course: Receives OB/GYN care at [facility]. Records reviewed. [facility]  Past Medical History:  Diagnosis Date   Hx of pre-eclampsia in prior pregnancy, currently pregnant 2017   Hypertension    OB History  Gravida Para Term Preterm AB Living  _0 SAB IAB Ectopic Multiple Live Births    1   0 3    # Outcome Date GA Lbr Len/2nd Weight Sex Delivery Anes PTL Lv  4 IAB 01/22/22 152w6d       3 Term 04/02/21 [id number]:17 3430 g F CS-LTranv Gen  LIV  2 Term 01/02/17 39w45w6d48 / 00:21 3405 g F Vag-Spont EPI  LIV  1 Term 09/19/15 39w623w6d:36 2935 g F Vag-Spont EPI  LIV   Past Surgical History:  Procedure Laterality Date   ADENOIDECTOMY     CESAREAN SECTION N/A 04/02/2021   Procedure: CESAREAN SECTION;  Surgeon: DuncaRadene Gunning  [facility];  Service: Obstetrics;  Laterality: N/A;   TONSILLECTOMY     Family History  Problem  Relation Age of Onset   Hypertension Maternal Grandmother    Healthy Mother    Healthy Father    Social History   Tobacco Use   Smoking status: Never   Smokeless tobacco: Never  Vaping Use   Vaping Use: Never used  Substance Use Topics   Alcohol use: No   Drug use: No   Allergies  Allergen Reactions   Shellfish Allergy Swelling and Rash   Medications Prior to Admission  Medication Sig Dispense Refill Last Dose   acetaminophen (TYLENOL) 500 MG tablet Take 2 tablets (1,000 mg total) by mouth every 6 (six) hours as needed. 100 tablet 2 01/29/2022 at 0440   ibuprofen (ADVIL) 600 MG tablet Take 1 tablet (600 mg total) by mouth every 6 (six) hours. 30 tablet 0 01/28/2022   Blood Pressure Monitoring (BLOOD PRESSURE KIT) DEVI 1 kit by Does not apply route once a week. 1 each 0    Prenatal Vit-Fe Fumarate-FA (PRENATAL COMPLETE) 14-0.4 MG TABS Take 1 each by mouth daily. 60 tablet 0    I have reviewed patient's Past Medical Hx, Surgical Hx, Family Hx, Social Hx, medications and allergies.   ROS:  Pertinent items noted in HPI and remainder of comprehensive ROS otherwise negative.   Physical Exam  Patient Vitals for the past 24 hrs:  BP Temp Temp src Pulse Resp SpO2 Height Weight  01/29/22 0930 108/66 98 F (36.7 C) Oral 83 18 97 % -- --  01/29/22 0728 122/71 -- -- 88 19 100 % -- --  01/29/22 0726 -- -- -- -- -- 100 % -- --  01/29/22 0650 114/67 -- -- 89 -- 100 % -- --  01/29/22 0556 121/67 -- -- 96 -- 97 % -- --  01/29/22 0540 130/81 98.8 F (37.1 C) Oral (!) 129 18 99 % _0  (1.651 m) 87.5 kg   Constitutional: Well-developed, well-nourished female in no acute distress.  Cardiovascular: normal rhythm, tachycardic, at times diaphoretic but overall warm and well-perfused Respiratory: normal effort, no problems with respiration noted GI: Abd soft, non-tender MS: Extremities nontender, no edema, normal ROM Neurologic: Alert and oriented x 4.  GU: no CVA tenderness Pelvic: normal  external female genitalia, copious blood with clots noted. Continuously soaking large pads    Labs: Results for orders placed or performed during the hospital encounter of 01/29/22 (from the past 24 hour(s))  Type and screen [place]     Status: None   Collection Time: 01/29/22  5:30 AM  Result Value Ref Range   ABO/RH(D) O POS    Antibody Screen NEG    Sample Expiration      02/01/2022,2359 Performed at [facility], [place] [address], [place], [place] [address]   CBC     Status: Abnormal   Collection Time: 01/29/22  5:35 AM  Result Value Ref Range   WBC 5.9 4.0 - 10.5 K/uL   RBC 4.98 3.87 - 5.11 MIL/uL   Hemoglobin 11.4 (L) 12.0 - 15.0 g/dL   HCT 36.0 36.0 - 46.0 %   MCV 72.3 (L) 80.0 - 100.0 fL   MCH 22.9 (L) 26.0 - 34.0 pg   MCHC 31.7 30.0 - 36.0 g/dL   RDW 16.1 (H) 11.5 - 15.5 %   Platelets 214 150 - 400 K/uL   nRBC 0.0 0.0 - 0.2 %   Imaging:  US OB Transvaginal  Result Date: 01/29/2022 CLINICAL DATA:  Heavy vaginal bleeding. Positive pregnancy test with documented intrauterine gestational sac on previous study. EXAM: TRANSVAGINAL OB ULTRASOUND TECHNIQUE: Transvaginal ultrasound was performed for complete evaluation of the gestation as well as the maternal uterus, adnexal regions, and pelvic cul-de-sac. COMPARISON:  01/18/2022 FINDINGS: Intrauterine gestational sac: None. Yolk sac:  Not visualized. Embryo:  Not visualized Cardiac Activity: Not visualized Maternal uterus/adnexae: Endometrium is heterogeneous measuring 16 mm in AP thickness. Color Doppler evaluation reveals no substantial associated flow signal within the endometrium. No endometrial mass lesion evident. No evidence for free fluid in the cul-de-sac. Neither ovary can be discretely visualized. IMPRESSION: 1. No intrauterine gestational sac identified on the current study. Given presence of gestational sac on the study from 11 days ago, imaging features today are consistent with pregnancy  loss/spontaneous abortion. 2. Heterogeneous endometrial canal measuring 16 mm in thickness without a discrete/focal mass or endometrial vascularity. Findings are nonspecific and could indicate endometrial blood products, although hypovascular retained products of conception cannot be excluded. Consider short term follow-up pelvic ultrasound or further evaluation with pelvic MRI with and without IV contrast, as clinically warranted. Electronically Signed   By: Misty Stanley M.D.   On: 01/29/2022 06:14    MAU Course: Orders Placed This Encounter  Procedures   US OB Transvaginal   CBC   Type and screen [place]   Discharge patient  Meds ordered this encounter  Medications   methylergonovine (METHERGINE) injection 0.2 mg   methylergonovine (METHERGINE) 0.2 MG/ML injection    Twitty, Alondra S: cabinet override   lactated ringers bolus 1,000 mL   ketorolac (TORADOL) 30 MG/ML injection 30 mg   misoprostol (CYTOTEC) tablet 1,000 mcg   lactated ringers bolus 1,000 mL   MDM: LR bolus hung, helped pt get changed and began weighing pads. Ordered CBC, T&S, methergine and called ultrasound to bedside for stat TVUS (order in).  Bleeding still heavy but slowed somewhat. Hemoglobin stable and ultrasound showed likely clots, low suspicion for POC. Discussed presentation and findings with Dr. Kennon Rounds who recommended 1067mg cytotec and monitoring for passage of clots/bleeding. Advised speculum exam if bleeding remains unresolved.  Care turned over to CLen Blalock CNM at 0[place] CNM, MSN, [facility]ified Nurse Midwife, [facility]oon[facility]   Upon repeat assessment, bleeding minimal since 0800. Patient reports feeling much better. Follow up plan reviewed at length and [facility]. Return precautions reviewed  Assessment: 1. Retained products of conception after induced termination of pregnancy     Plan:  -Discharge  home in stable condition -Vaginal bleeding and pain precautions discussed -Patient advised to follow-up with [facility] for outpatient follow up -Patient may return to MAU as needed or if her condition were to change or worsen  CWende Mott CNM 01/29/22. 9:40 AM

## 2022-01-29 NOTE — Progress Notes (Signed)
Reports cramping is tolerable

## 2022-01-29 NOTE — MAU Note (Signed)
.  Debra Lucas is a 26 y.o. at [redacted]w[redacted]d here in MAU reporting: [facility] here with increased bleeding this morning at 0415. Clothes were soaked in blood when she woke up. Has continued to have heavy bright red bleeding. Lower abdominal cramping coming and going rates pain 5 out of 10.   Edd Arbour, CNM at bedside.

## 2022-01-29 NOTE — MAU Note (Signed)
Ultrasound at bedside

## 2022-02-12 ENCOUNTER — Encounter: Payer: Self-pay | Admitting: Obstetrics and Gynecology

## 2022-02-12 ENCOUNTER — Ambulatory Visit (INDEPENDENT_AMBULATORY_CARE_PROVIDER_SITE_OTHER): Payer: Medicaid Other | Admitting: Obstetrics and Gynecology

## 2022-02-12 VITALS — BP 119/77 | HR 99 | Ht 65.0 in | Wt 195.0 lb

## 2022-02-12 DIAGNOSIS — Z09 Encounter for follow-up examination after completed treatment for conditions other than malignant neoplasm: Secondary | ICD-10-CM | POA: Diagnosis not present

## 2022-02-12 NOTE — Progress Notes (Signed)
26 yo P3 presenting today as an MAU follow up on retained products following termination of pregnancy. Patient received cytotec on 01/29/22 in MAU and heavy vaginal bleeding resolved. She denies any further episodes of vaginal bleeding. She is awaiting the onset of her menstrual cycle to start her birth control patch which was prescribed by [facility]  Past Medical History:  Diagnosis Date   Hx of pre-eclampsia in prior pregnancy, currently pregnant 2017   Hypertension    Past Surgical History:  Procedure Laterality Date   ADENOIDECTOMY     CESAREAN SECTION N/A 04/02/2021   Procedure: CESAREAN SECTION;  Surgeon: Radene Gunning, MD;  [facility];  Service: Obstetrics;  Laterality: N/A;   TONSILLECTOMY     Family History  Problem Relation Age of Onset   Hypertension Maternal Grandmother    Healthy Mother    Healthy Father    Social History   Tobacco Use   Smoking status: Never   Smokeless tobacco: Never  Vaping Use   Vaping Use: Never used  Substance Use Topics   Alcohol use: No   Drug use: No   ROS See pertinent in HPI. All other systems reviewed and non contributory Blood pressure 119/77, pulse 99, height 5\' 5"  (1.651 m), weight 195 lb (88.5 kg), currently breastfeeding. GENERAL: Well-developed, well-nourished female in no acute distress.  NEURO: alert and oriented x 3  A/P 26 yo here for follow up s/p termination of pregnancy - Reassurance provided - Patient may start patch as planned. Discussed decrease in effectiveness with BMI greater than 30 - Patient plans to return in 2024 for annual exam with pap smear and may consider changing her birth control

## 2022-02-12 NOTE — Progress Notes (Signed)
26 y.o GYN presents for FU of TAB.  Reports no problems today.

## 2022-03-15 ENCOUNTER — Encounter (HOSPITAL_COMMUNITY): Payer: Self-pay | Admitting: Obstetrics & Gynecology

## 2022-03-15 ENCOUNTER — Other Ambulatory Visit: Payer: Self-pay

## 2022-03-15 ENCOUNTER — Inpatient Hospital Stay (HOSPITAL_COMMUNITY)
Admission: AD | Admit: 2022-03-15 | Discharge: 2022-03-15 | Disposition: A | Discharge status: Home / Self Care | Payer: Medicaid Other | Attending: Obstetrics & Gynecology | Admitting: Obstetrics & Gynecology

## 2022-03-15 DIAGNOSIS — R1030 Lower abdominal pain, unspecified: Secondary | ICD-10-CM | POA: Insufficient documentation

## 2022-03-15 DIAGNOSIS — N939 Abnormal uterine and vaginal bleeding, unspecified: Secondary | ICD-10-CM

## 2022-03-15 DIAGNOSIS — I10 Essential (primary) hypertension: Secondary | ICD-10-CM | POA: Insufficient documentation

## 2022-03-15 DIAGNOSIS — O909 Complication of the puerperium, unspecified: Secondary | ICD-10-CM | POA: Insufficient documentation

## 2022-03-15 LAB — HCG, QUANTITATIVE, PREGNANCY: hCG, Beta Chain, Quant, S: 10 m[IU]/mL — ABNORMAL HIGH (ref <5)

## 2022-03-15 LAB — POCT PREGNANCY, URINE: Preg Test, Ur: NEGATIVE

## 2022-03-15 NOTE — MAU Note (Signed)
Debra Lucas is a 26 y.o. at [redacted]w[redacted]d here in MAU reporting: she' having VB that been occurring since her abortion last month on 01/22/2022.  States wearing a sanitary napkin, states VB is heavy.  Reports she had +HPT last Friday, unsure if new pregnancy.  Endorses mild cramping. LMP: no cycle since 06/30/20 Onset of complaint: today Pain score: 2 Vitals:   03/15/22 0844  BP: 109/64  Pulse: 86  Resp: 18  Temp: 98.4 F (36.9 C)  SpO2: 100%     FHT:NA Lab orders placed from triage:   UPT

## 2022-03-15 NOTE — MAU Provider Note (Signed)
History     CSN: [id number]  Arrival date and time: 03/15/22 0831   Event Date/Time   First Provider Initiated Contact with Patient 03/15/22 0901      Chief Complaint  Patient presents with   Vaginal Bleeding   HPI Debra Lucas is a 26 y.o. G4P3013 who presents to MAU for vaginal bleeding. She reports that she had a surgical TAB on 9/5 and has had vaginal bleeding every day since her procedure, however last night the bleeding became heavier. She reports she soaked one pad last night and has had to change her pad once already today. She has not passed any clots or tissue. She reports mild, lower abdominal cramping as well. She was given a prescription for the birth control patch provided by [facility] however has not started using it because she "wanted to stop bleeding first and allow her body to reset before starting  it". She has had unprotected intercourse since the TAB. She does not want to be pregnant. She reports she had a positive UPT at home on Friday, but took it because she has not yet gotten her period.   Patient receives care at [facility].   OB History     Gravida  5   Para  3   Term  3   Preterm      AB  1   Living  3      SAB      IAB  1   Ectopic      Multiple  0   Live Births  3           Past Medical History:  Diagnosis Date   Hx of pre-eclampsia in prior pregnancy, currently pregnant 2017   Hypertension     Past Surgical History:  Procedure Laterality Date   ADENOIDECTOMY     CESAREAN SECTION N/A 04/02/2021   Procedure: CESAREAN SECTION;  Surgeon: Duncan, Paula, MD;  [facility]: [facility];  Service: Obstetrics;  Laterality: N/A;   TONSILLECTOMY      Family History  Problem Relation Age of Onset   Hypertension Maternal Grandmother    Healthy Mother    Healthy Father     Social History   Tobacco Use   Smoking status: Never   Smokeless tobacco: Never  Vaping Use   Vaping Use: Never used  Substance Use Topics    Alcohol use: No   Drug use: No    Allergies:  Allergies  Allergen Reactions   Shellfish Allergy Swelling and Rash    Medications Prior to Admission  Medication Sig Dispense Refill Last Dose   acetaminophen (TYLENOL) 500 MG tablet Take 2 tablets (1,000 mg total) by mouth every 6 (six) hours as needed. (Patient not taking: Reported on 02/12/2022) 100 tablet 2    Blood Pressure Monitoring (BLOOD PRESSURE KIT) DEVI 1 kit by Does not apply route once a week. 1 each 0    ibuprofen (ADVIL) 600 MG tablet Take 1 tablet (600 mg total) by mouth every 6 (six) hours. (Patient not taking: Reported on 02/12/2022) 30 tablet 0    Prenatal Vit-Fe Fumarate-FA (PRENATAL COMPLETE) 14-0.4 MG TABS Take 1 each by mouth daily. (Patient not taking: Reported on 02/12/2022) 60 tablet 0    Review of Systems  Constitutional: Negative.   Respiratory: Negative.    Cardiovascular: Negative.   Gastrointestinal:  Positive for abdominal pain (cramping).  Genitourinary:  Positive for vaginal bleeding. Negative for dysuria.  Musculoskeletal: Negative.     Neurological: Negative.    Physical Exam   Blood pressure 109/64, pulse 86, temperature 98.4 F (36.9 C), temperature source Oral, resp. rate 18, height 5' 5" (1.651 m), weight 85.3 kg, SpO2 100 %, currently breastfeeding.  Physical Exam Vitals and nursing note reviewed.  Constitutional:      General: She is not in acute distress. Eyes:     Extraocular Movements: Extraocular movements intact.     Pupils: Pupils are equal, round, and reactive to light.  Cardiovascular:     Rate and Rhythm: Normal rate.  Pulmonary:     Effort: Pulmonary effort is normal.  Abdominal:     Palpations: Abdomen is soft.     Tenderness: There is no abdominal tenderness.  Musculoskeletal:        General: Normal range of motion.     Cervical back: Normal range of motion.  Skin:    General: Skin is warm and dry.  Neurological:     General: No focal deficit present.     Mental Status:  She is alert and oriented to person, place, and time.  Psychiatric:        Mood and Affect: Mood normal.        Behavior: Behavior normal.    MAU Course  Procedures  MDM UPT negative Hcg 10 today  Given that it is unknown if hcg is still elevated from previous pregnancy or if this is a new pregnancy will have patient return to MAU in 48 hours for repeat bhcg.   Reviewed records, blood type O positive  Assessment and Plan  Vaginal bleeding  - Discharge home in stable condition - Return to MAU on 10/29 for repeat bHCG. Return sooner for new/worsening symptoms   Renee Harder, CNM 03/15/2022, 10:27 AM

## 2022-03-17 ENCOUNTER — Inpatient Hospital Stay (HOSPITAL_COMMUNITY): Payer: Medicaid Other | Attending: Obstetrics & Gynecology

## 2022-04-08 ENCOUNTER — Encounter: Payer: Self-pay | Admitting: Advanced Practice Midwife

## 2022-04-08 ENCOUNTER — Ambulatory Visit (INDEPENDENT_AMBULATORY_CARE_PROVIDER_SITE_OTHER): Payer: Medicaid Other | Admitting: Advanced Practice Midwife

## 2022-04-08 VITALS — BP 119/71 | HR 89 | Ht 65.0 in | Wt 187.2 lb

## 2022-04-08 DIAGNOSIS — Z3045 Encounter for surveillance of transdermal patch hormonal contraceptive device: Secondary | ICD-10-CM | POA: Insufficient documentation

## 2022-04-08 DIAGNOSIS — Z3009 Encounter for other general counseling and advice on contraception: Secondary | ICD-10-CM

## 2022-04-08 DIAGNOSIS — N939 Abnormal uterine and vaginal bleeding, unspecified: Secondary | ICD-10-CM

## 2022-04-08 MED ORDER — XULANE 150-35 MCG/24HR TD PTWK: MEDICATED_PATCH | TRANSDERMAL | 4 refills | Status: DC

## 2022-04-08 NOTE — Progress Notes (Signed)
   GYNECOLOGY PROGRESS NOTE  History:  26 y.o. [id number] presents to [facility] office today for problem gyn visit. She reports brown spotting daily since 01/2022 after a TAB. She had a cesarean delivery on 04/02/21 and reports irregular light bleeding due to breastfeeding until 2023.  She had an increase in bleeding in August 2023, and found out she was pregnant. After the TAB she reports light spotting every day that has not resolved.  Hcg on 03/15/22 was 10 when she was seen in MAU for her symptoms. She denies h/a, dizziness, shortness of breath, n/v, or fever/chills.    The following portions of the patient's history were reviewed and updated as appropriate: allergies, current medications, past family history, past medical history, past social history, past surgical history and problem list. Last pap smear on 04/20/20 was normal.  Health Maintenance Due  Topic Date Due   HPV VACCINES (1 - 2-dose series) Never done   COVID-19 Vaccine (3 - Pfizer series) 05/25/2020   INFLUENZA VACCINE  12/18/2021     Review of Systems:  Pertinent items are noted in HPI.   Objective:  Physical Exam Blood pressure 119/71, pulse 89, height 5\' 5"  (1.651 m), weight 187 lb 3.2 oz (84.9 kg), last menstrual period 03/15/2022, unknown if currently breastfeeding. VS reviewed, nursing note reviewed,  Constitutional: well developed, well nourished, no distress HEENT: normocephalic CV: normal rate Pulm/chest wall: normal effort Breast Exam: deferred Abdomen: soft Neuro: alert and oriented x 3 Skin: warm, dry Psych: affect normal Pelvic exam: Deferred  Assessment & Plan:  1. Vaginal spotting --Pt is s/p TAB 01/2022, with hcg in MAU on 03/15/22 of 10.  --Pt reports irregular light spotting only since childbirth 04/02/22 with breastfeeding, then an increase in bleeding with pregnancy 01/2022. After the TAB, she reports daily brown spotting  --Prior to childbirth in 2022 she had monthly regular periods and no in  between spotting  - Beta hCG quant (ref lab) - norelgestromin-ethinyl estradiol 2023) 150-35 MCG/24HR transdermal patch; Place one patch onto the skin weekly for 3 weeks then do not wear a patch for the 4th week. Repeat.  Dispense: 9 patch; Refill: 4  2. Encounter for counseling regarding contraception --Discussed pt contraceptive plans and reviewed contraceptive methods based on pt preferences and effectiveness.  Pt prefers contraceptive patch. She is nonsmoker, no HTN, no migraines, no hx DVT/PE.    --Virtual f/u in 3 months  - norelgestromin-ethinyl estradiol Burr Medico) 150-35 MCG/24HR transdermal patch; Place one patch onto the skin weekly for 3 weeks then do not wear a patch for the 4th week. Repeat.  Dispense: 9 patch; Refill: 4   No follow-ups on file.   Burr Medico, CNM 10:18 AM

## 2022-04-08 NOTE — Progress Notes (Signed)
Pt reports that she has been having some bleeding/spotting since her abortion in September. She reports that the discharge is brown and often has an odor. Pt would like to discuss BC options to determine if that'll help with the bleeding. Would potentially like the patch. No other concerns at this time.

## 2022-04-09 LAB — BETA HCG QUANT (REF LAB): hCG Quant: <1 m[IU]/mL

## 2022-08-12 NOTE — Progress Notes (Unsigned)
Subjective:     Debra Lucas is a 27 y.o. female here at [facility] for a routine exam.  Current complaints: none.  Rx for contraceptive patch on 04/08/22. Pt likes the patch, sometimes a patch comes off and won't stay on, however so she asks about getting a few extra for when that happens.    Personal health questionnaire reviewed: yes.  Do you have a primary care provider? yes Do you feel safe at home? yes  [place] Visit from 08/13/2022 in [facility] for [place] at [facility] Total Score 0       Health Maintenance Due  Topic Date Due   HPV VACCINES (1 - 2-dose series) Never done   INFLUENZA VACCINE  12/18/2021   COVID-19 Vaccine (3 - 2023-24 season) 01/18/2022     Risk factors for chronic health problems: Smoking: Alchohol/how much: Pt BMI: Body mass index is 29.49 kg/m.   Gynecologic History Patient's last menstrual period was 07/31/2022 (approximate). Contraception: Ortho-Evra patches weekly Last Pap: 04/20/20. Results were: normal Last mammogram: n/a.   Obstetric History OB History  Gravida Para Term Preterm AB Living  4 3 3   1 3   SAB IAB Ectopic Multiple Live Births    1   0 3    # Outcome Date GA Lbr Len/2nd Weight Sex Delivery Anes PTL Lv  4 IAB 01/22/22 [redacted]w[redacted]d         3 Term 04/02/21 [redacted]w[redacted]d / 00:17 7 lb 9 oz (3.43 kg) F CS-LTranv Gen  LIV  2 Term 01/02/17 [redacted]w[redacted]d 18:48 / 00:21 7 lb 8.1 oz (3.405 kg) F Vag-Spont EPI  LIV  1 Term 09/19/15 [redacted]w[redacted]d / 00:36 6 lb 7.5 oz (2.935 kg) F Vag-Spont EPI  LIV     The following portions of the patient's history were reviewed and updated as appropriate: allergies, current medications, past family history, past medical history, past social history, past surgical history, and problem list.  Review of Systems Pertinent items noted in HPI and remainder of comprehensive ROS otherwise negative.    Objective:   VS reviewed, nursing note reviewed,  Constitutional: well developed, well  nourished, no distress HEENT: normocephalic, thyroid without enlargement or mass HEART: RRR, no murmurs rubs/gallops RESP: clear and equal to auscultation bilaterally in all lobes  Breast Exam:  exam performed: right breast normal without mass, skin or nipple changes or axillary nodes, left breast normal without mass, skin or nipple changes or axillary nodes Abdomen: soft Neuro: alert and oriented x 3 Skin: warm, dry Psych: affect normal Pelvic exam: Performed: Cervix pink, visually closed, without lesion, scant white creamy discharge, vaginal walls and external genitalia normal Bimanual exam: Cervix 0/long/high, firm, anterior, neg CMT, uterus nontender, nonenlarged, adnexa without tenderness, enlargement, or mass        Assessment/Plan:   1. Well woman exam with routine gynecological exam   2. Routine screening for STI (sexually transmitted infection)  - Cervicovaginal ancillary only( [place]) - HIV antibody (with reflex) - RPR - Hepatitis C Antibody - Hepatitis B Surface AntiGEN  3. Cervical cancer screening  - Cytology - PAP( [place])  4. Encounter for counseling regarding contraception --Pt is happy with the patch.  Rx renewed, with additional refills for when a patch comes off and needs to be replaced.  - norelgestromin-ethinyl estradiol Marilu Favre) 150-35 MCG/24HR transdermal patch; Place one patch onto the skin weekly for 3 weeks then do not wear a patch for the 4th week.  Repeat.  Dispense: 9 patch; Refill: 6    Return in about 1 year (around 08/13/2023) for annual exam.   Fatima Blank, CNM 8:46 AM

## 2022-08-13 ENCOUNTER — Ambulatory Visit (INDEPENDENT_AMBULATORY_CARE_PROVIDER_SITE_OTHER): Payer: Medicaid Other | Admitting: Advanced Practice Midwife

## 2022-08-13 ENCOUNTER — Encounter: Payer: Self-pay | Admitting: Advanced Practice Midwife

## 2022-08-13 ENCOUNTER — Other Ambulatory Visit (HOSPITAL_COMMUNITY)
Admission: RE | Admit: 2022-08-13 | Discharge: 2022-08-13 | Disposition: A | Discharge status: Home / Self Care | Payer: Medicaid Other | Source: Ambulatory Visit | Attending: Advanced Practice Midwife | Admitting: Advanced Practice Midwife

## 2022-08-13 VITALS — BP 121/67 | HR 88 | Ht 65.0 in | Wt 177.2 lb

## 2022-08-13 DIAGNOSIS — Z124 Encounter for screening for malignant neoplasm of cervix: Secondary | ICD-10-CM | POA: Insufficient documentation

## 2022-08-13 DIAGNOSIS — Z3009 Encounter for other general counseling and advice on contraception: Secondary | ICD-10-CM

## 2022-08-13 DIAGNOSIS — Z113 Encounter for screening for infections with a predominantly sexual mode of transmission: Secondary | ICD-10-CM

## 2022-08-13 DIAGNOSIS — Z1339 Encounter for screening examination for other mental health and behavioral disorders: Secondary | ICD-10-CM | POA: Diagnosis not present

## 2022-08-13 DIAGNOSIS — Z8759 Personal history of other complications of pregnancy, childbirth and the puerperium: Secondary | ICD-10-CM

## 2022-08-13 DIAGNOSIS — Z98891 History of uterine scar from previous surgery: Secondary | ICD-10-CM | POA: Diagnosis not present

## 2022-08-13 DIAGNOSIS — Z01419 Encounter for gynecological examination (general) (routine) without abnormal findings: Secondary | ICD-10-CM | POA: Diagnosis not present

## 2022-08-13 MED ORDER — NORELGESTROMIN-ETH ESTRADIOL 150-35 MCG/24HR TD PTWK: MEDICATED_PATCH | TRANSDERMAL | 6 refills | Status: DC

## 2022-08-13 NOTE — Progress Notes (Signed)
Pt presents for AEX. Last PAP 04/2020. Requesting PAP and STD testing. Pt requesting refill of Xulane. No other concerns.

## 2022-08-14 LAB — CERVICOVAGINAL ANCILLARY ONLY
Chlamydia: NEGATIVE
Comment: NEGATIVE
Comment: NEGATIVE
Comment: NORMAL
Neisseria Gonorrhea: NEGATIVE
Trichomonas: NEGATIVE

## 2022-08-15 LAB — CYTOLOGY - PAP: Adequacy: ABSENT

## 2022-08-16 ENCOUNTER — Encounter (HOSPITAL_BASED_OUTPATIENT_CLINIC_OR_DEPARTMENT_OTHER): Payer: Self-pay | Admitting: Advanced Practice Midwife

## 2022-08-16 DIAGNOSIS — R87612 Low grade squamous intraepithelial lesion on cytologic smear of cervix (LGSIL): Secondary | ICD-10-CM | POA: Insufficient documentation

## 2022-08-21 ENCOUNTER — Telehealth: Payer: Self-pay

## 2022-08-21 ENCOUNTER — Encounter: Payer: Self-pay | Admitting: Obstetrics and Gynecology

## 2022-08-21 LAB — HEPATITIS B SURFACE ANTIGEN: Hepatitis B Surface Ag: NEGATIVE

## 2022-08-21 LAB — RPR: RPR Ser Ql: NONREACTIVE

## 2022-08-21 LAB — HEPATITIS C ANTIBODY: Hep C Virus Ab: NONREACTIVE

## 2022-08-21 LAB — HIV ANTIBODY (ROUTINE TESTING W REFLEX): HIV Screen 4th Generation wRfx: NONREACTIVE

## 2022-08-21 NOTE — Telephone Encounter (Signed)
-----   Message from Elvera Maria, CNM sent at 08/21/2022 12:19 PM EDT ----- Regarding: Needs colposcopy I sent this patient a message on 3/29 about her abnormal pap with LSIL and need for colposcopy.  I am not sure she has seen my message and I don't see anything scheduled yet. Can we call her and let her know about the test result and need for further eval with colpo?  And then make sure she gets scheduled up front?  Thank you.   ----- Message ----- From: Interface, [facility] Sent: 08/14/2022   2:08 PM EDT To: Elvera Maria, CNM

## 2022-08-21 NOTE — Telephone Encounter (Signed)
Patient informed of results and the need for a colpo. Patient transferred to front desk to schedule appt.

## 2022-09-09 ENCOUNTER — Encounter: Payer: Self-pay | Admitting: Advanced Practice Midwife

## 2022-09-09 ENCOUNTER — Other Ambulatory Visit (HOSPITAL_BASED_OUTPATIENT_CLINIC_OR_DEPARTMENT_OTHER): Payer: Self-pay | Admitting: Advanced Practice Midwife

## 2022-09-09 DIAGNOSIS — Z3009 Encounter for other general counseling and advice on contraception: Secondary | ICD-10-CM

## 2022-09-09 MED ORDER — DROSPIRENONE-ETHINYL ESTRADIOL 3-0.02 MG PO TABS: 1.0000 | ORAL_TABLET | Freq: Every day | ORAL | 11 refills | Status: DC

## 2022-10-05 IMAGING — US US OB COMP LESS 14 WK
1 series · 15 of 28 positions shown · non-contrast
Comparison: September 02, 2016

CLINICAL DATA: A 24-year-old female with bleeding and spotting for
2 days. Quantitative beta HCG is in process. Last menstrual period
with gestational age of 7 weeks 5 days, last menstrual period on
06/30/2020.

EXAM:
OBSTETRIC <14 WK US AND TRANSVAGINAL OB US
TECHNIQUE: Both transabdominal and transvaginal ultrasound examinations were
performed for complete evaluation of the gestation as well as the
maternal uterus, adnexal regions, and pelvic cul-de-sac.
Transvaginal technique was performed to assess early pregnancy.

[Series 1: us ob comp less 14 wk · 15 of 44 slices shown]
[im 1/44]
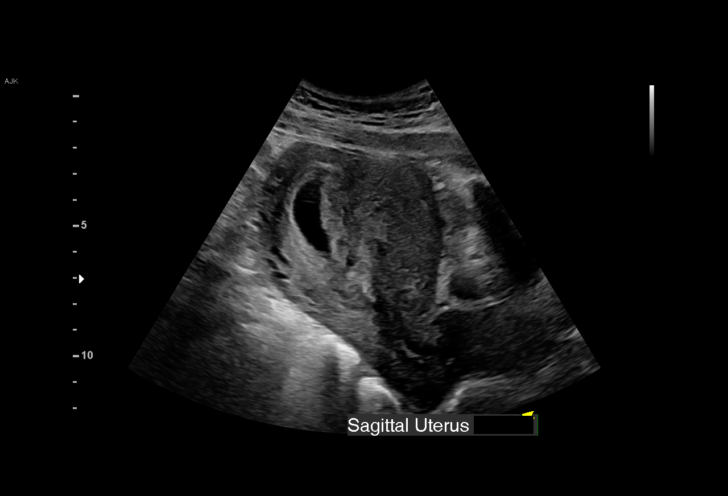
[im 4/44]
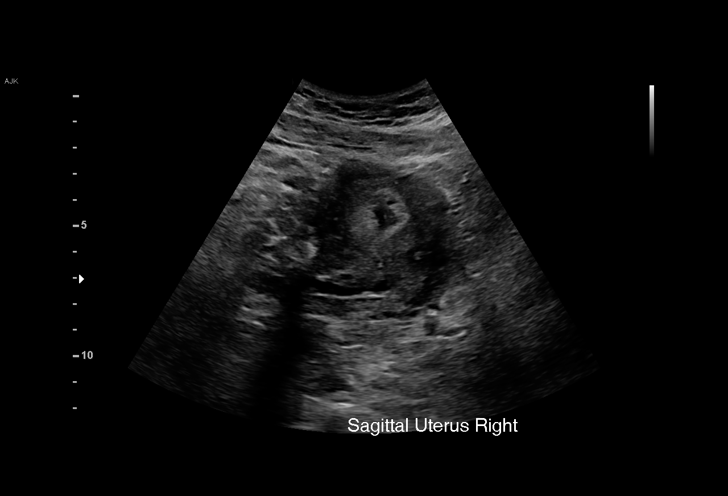
[im 7/44]
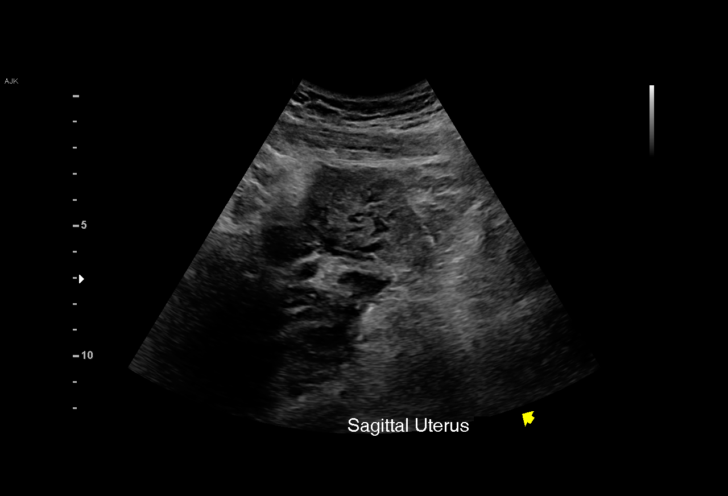
[im 10/44]
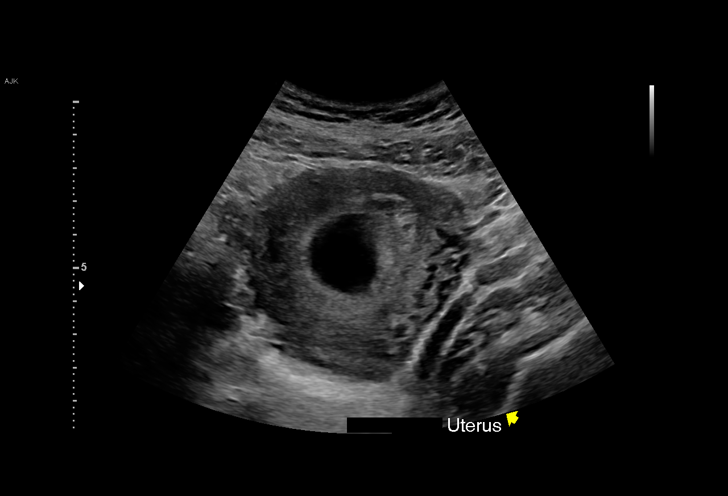
[im 13/44]
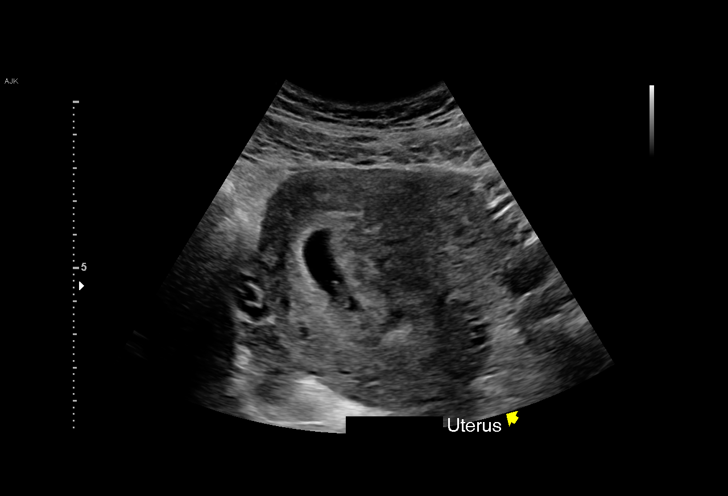
[im 16/44]
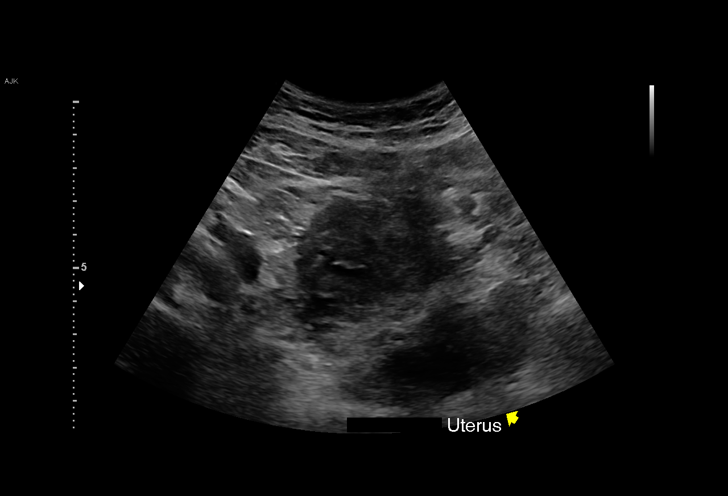
[im 20/44]
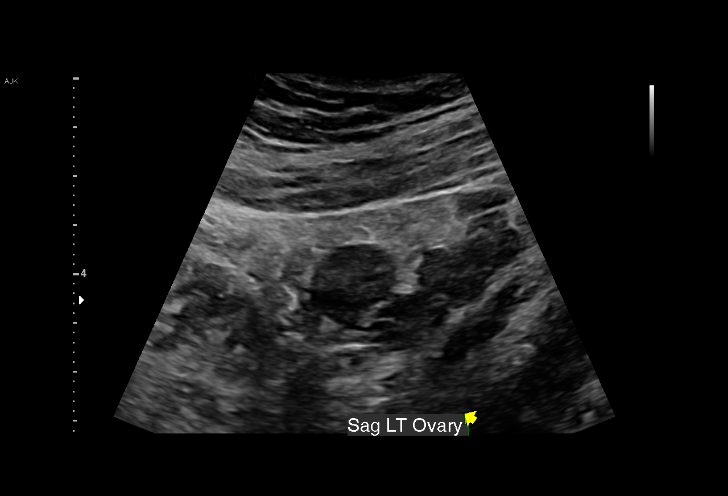
[im 23/44]
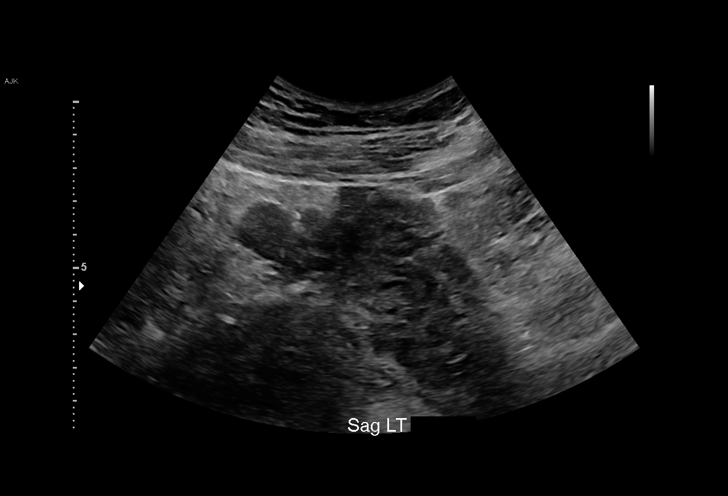
[im 24/44]
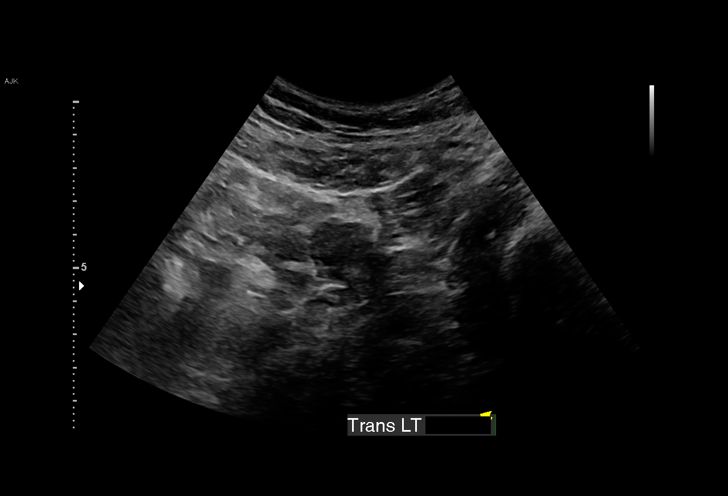
[im 28/44]
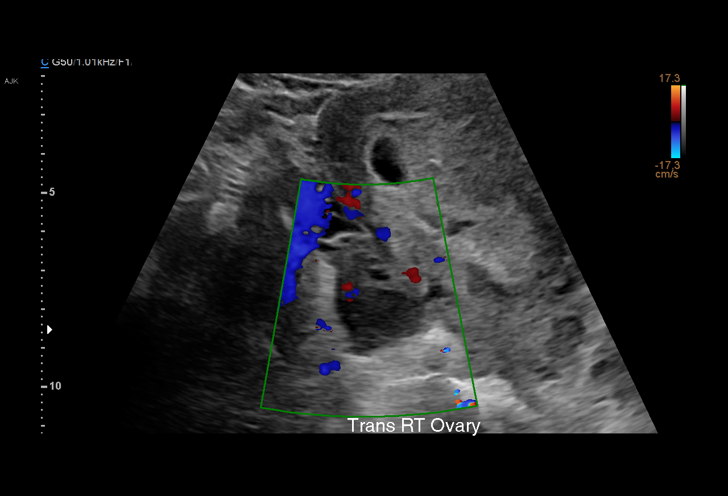
[im 31/44]
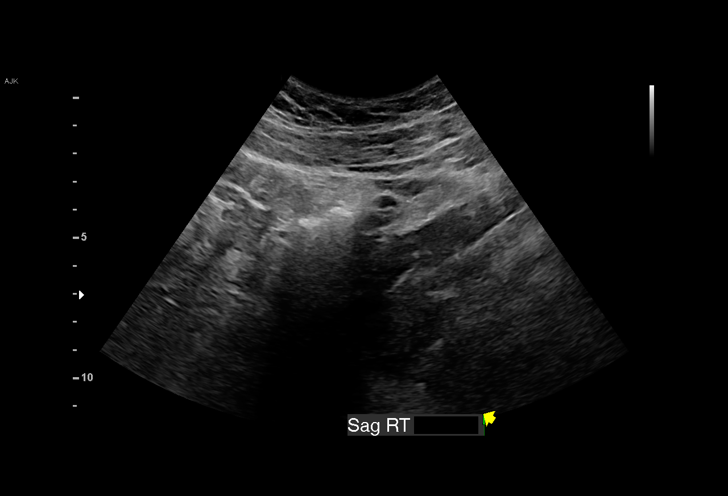
[im 34/44]
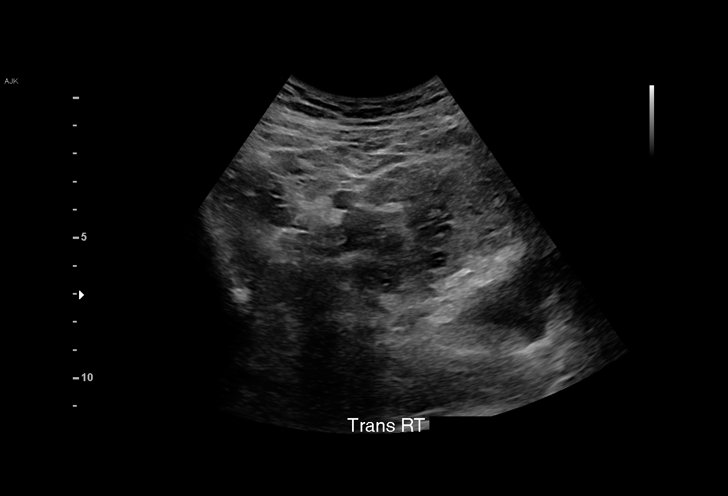
[im 37/44]
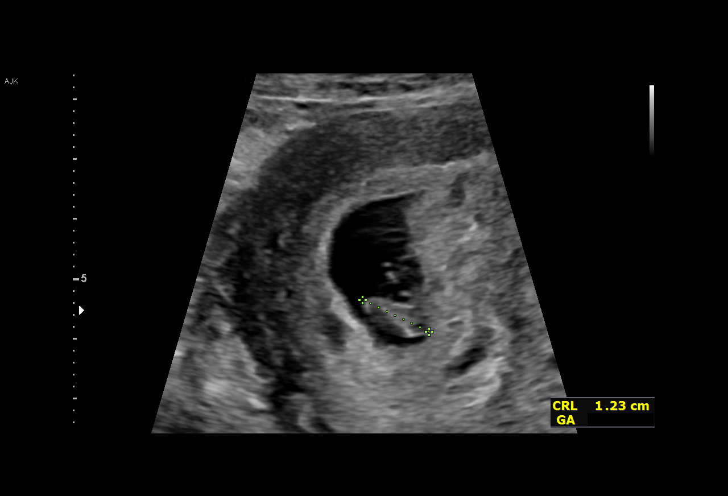
[im 40/44]
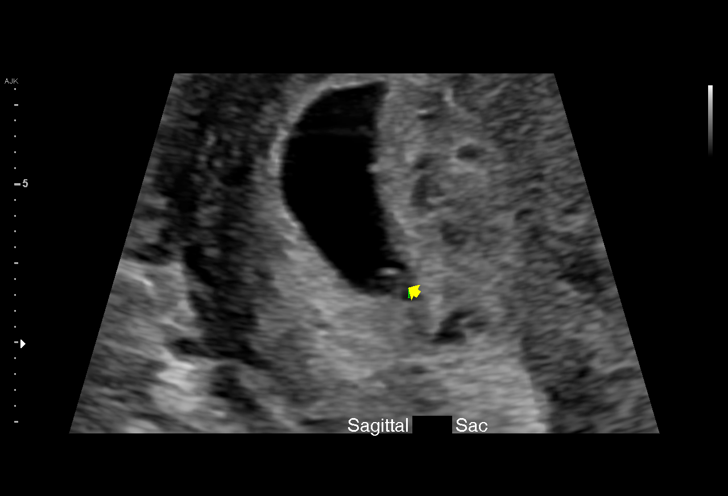
[im 44/44]
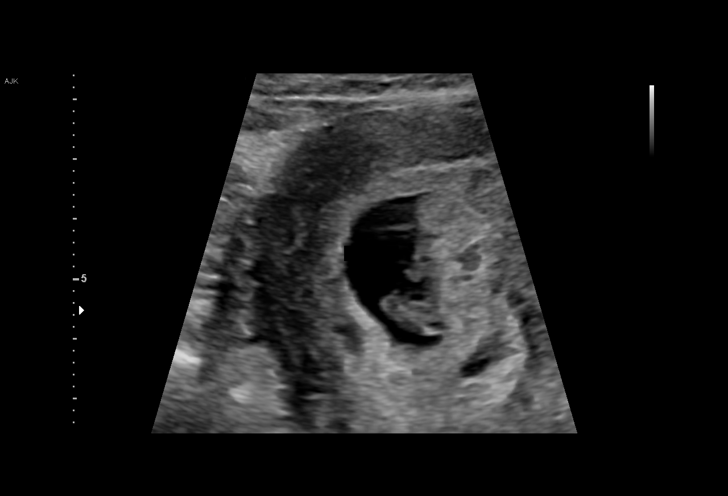

[15 of 28 positions shown; findings below may reference images not displayed]

FINDINGS: Intrauterine gestational sac: Single

Yolk sac:  Visualized

Embryo:  Visualized

Cardiac Activity: Visualized

Heart Rate: 150 bpm

CRL: 12.2 mm   7 w   3 d                  US EDC: 04/08/2021

Subchorionic hemorrhage:  Small

Maternal uterus/adnexae: Uterus and adnexa unremarkable. Corpus
luteum cyst in the RIGHT ovary. No free fluid.
IMPRESSION: Single intrauterine pregnancy, 7 weeks and 3 days by crown-rump
length, with a fetal heart rate of 150 beats per minute and small
subchorionic hemorrhage.

## 2022-10-08 ENCOUNTER — Encounter: Payer: Medicaid Other | Admitting: Obstetrics and Gynecology

## 2022-12-11 ENCOUNTER — Encounter: Payer: Medicaid Other | Admitting: Obstetrics and Gynecology

## 2022-12-23 IMAGING — US US MFM OB DETAIL+14 WK
1 series · 13 of 28 positions shown · non-contrast
Comparison: none

[Series 1: us mfm ob detail+14 wk · 13 of 96 slices shown]
[im 4/96]
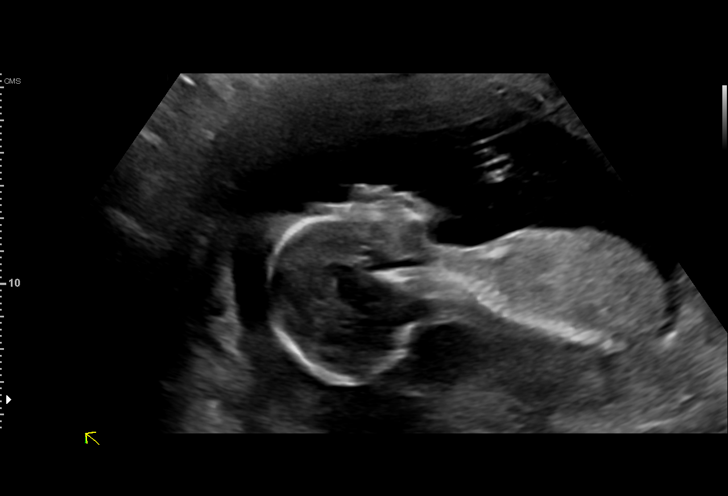
[im 11/96]
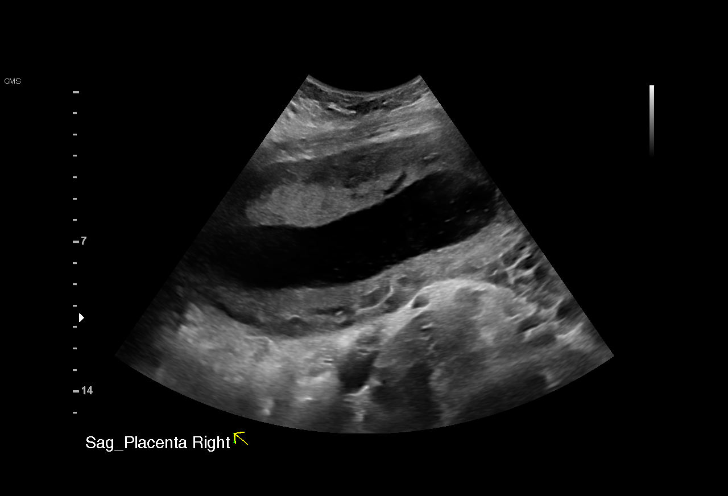
[im 18/96]
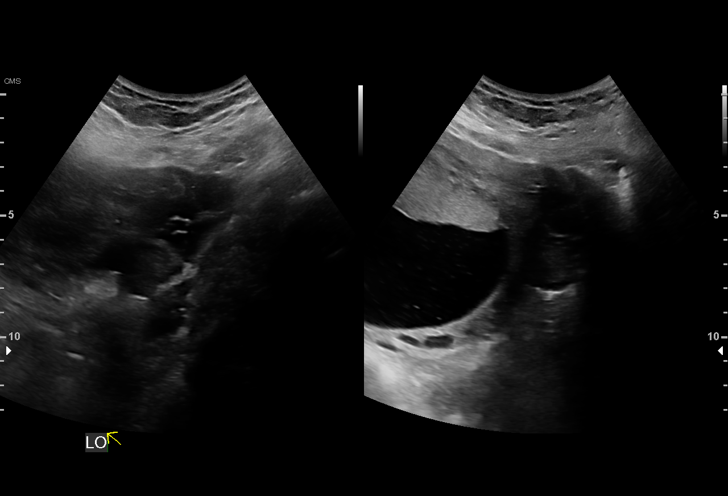
[im 25/96]
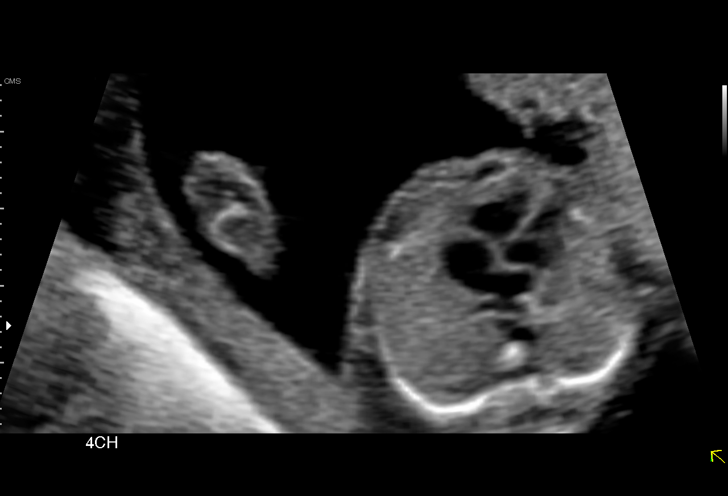
[im 32/96]
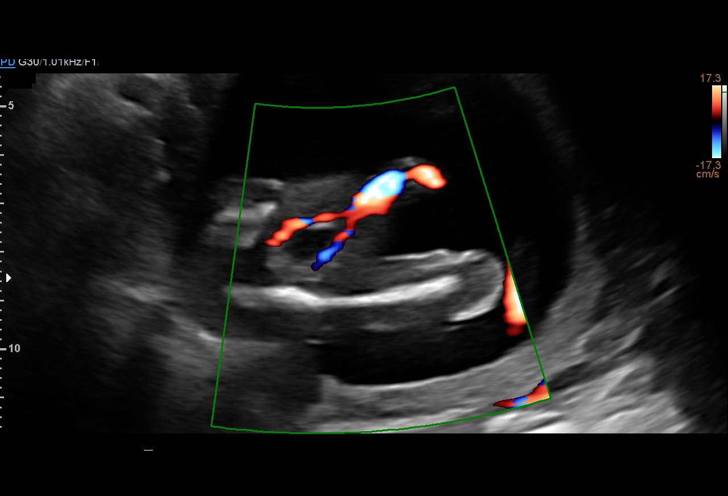
[im 39/96]
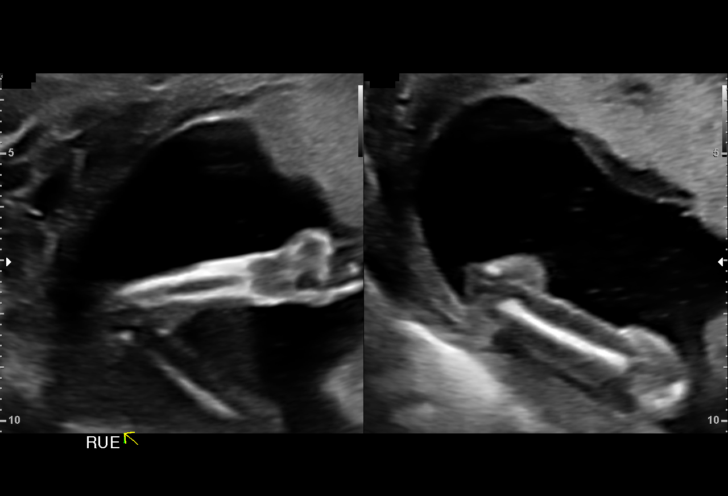
[im 50/96]
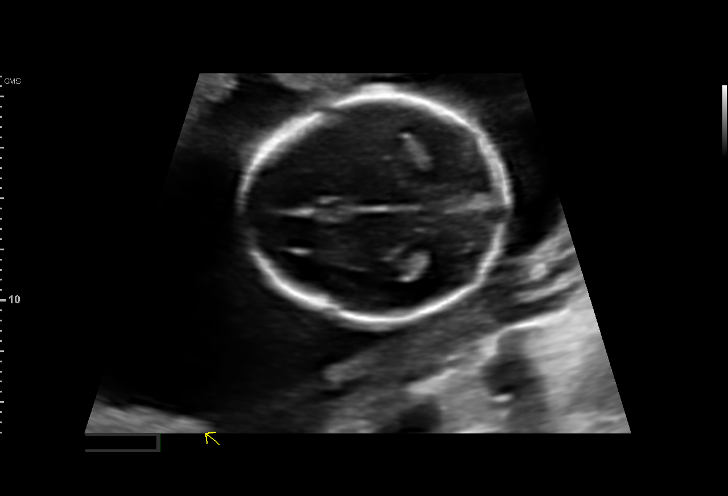
[im 57/96]
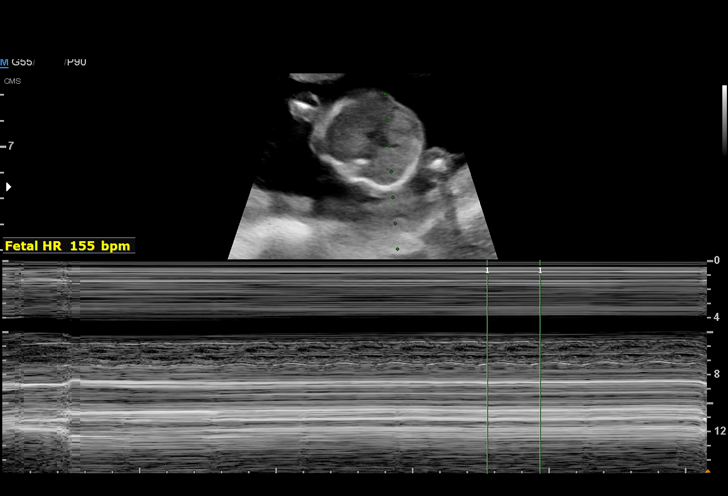
[im 64/96]
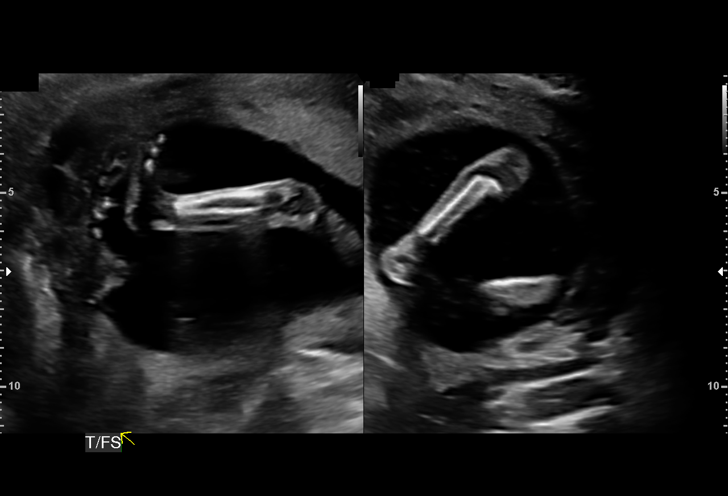
[im 71/96]
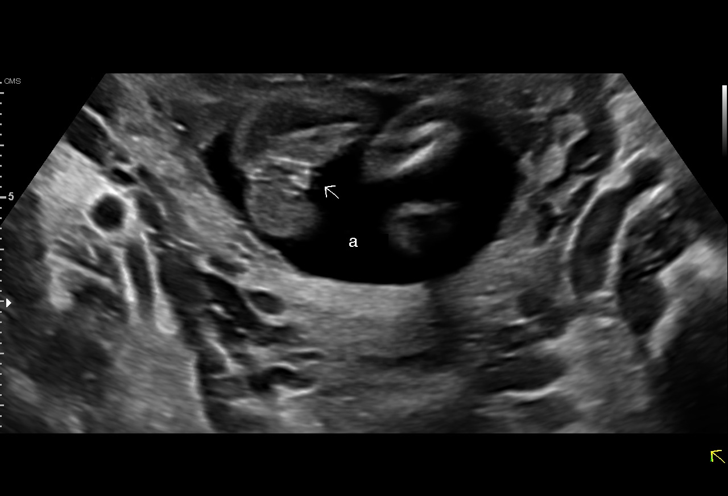
[im 78/96]
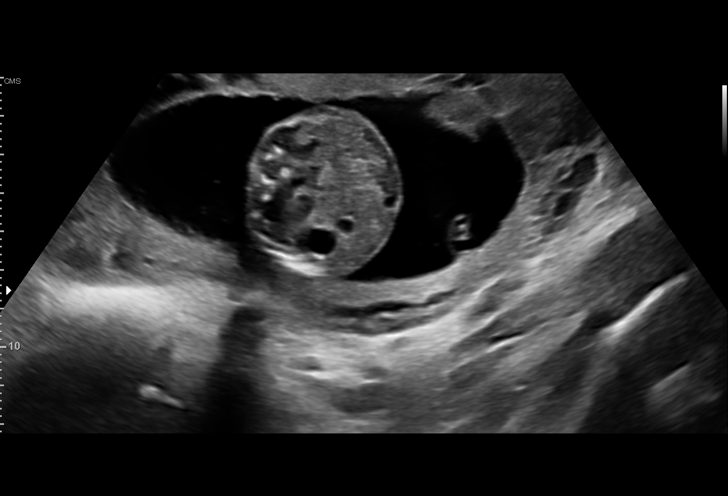
[im 85/96]
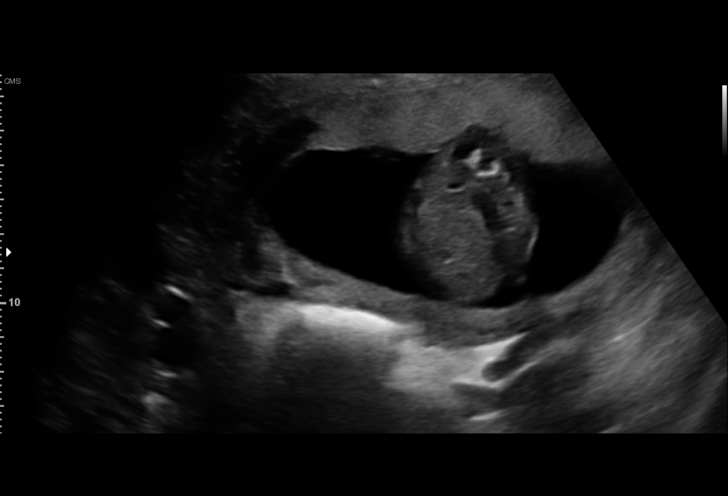
[im 92/96]
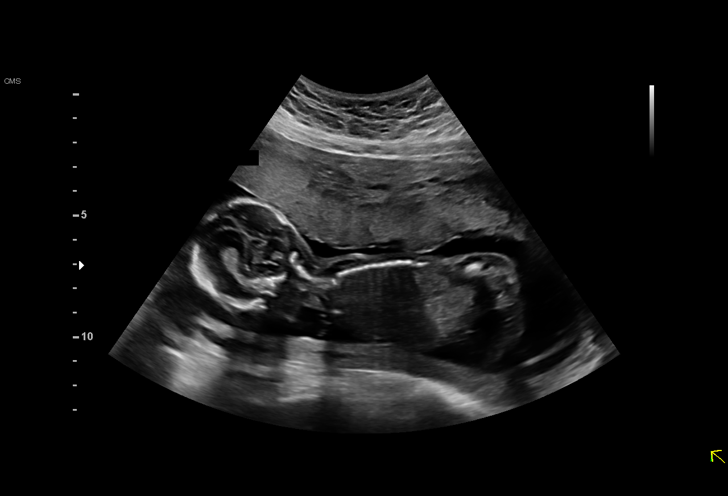

[13 of 28 positions shown; findings below may reference images not displayed]

Indications

 19 weeks gestation of pregnancy
 Encounter for antenatal screening for
 malformations
 Low risk NIPS, neg AFP
 Genetic carrier (SMA and Chetansinh Dabbs or beta
 hemoglobinopathy)
 Poor obstetric history: Previous preeclampsia
Fetal Evaluation

 Num Of Fetuses:         1
 Fetal Heart Rate(bpm):  155
 Cardiac Activity:       Observed
 Presentation:           Breech
 Placenta:               Anterior
 P. Cord Insertion:      Visualized

 Amniotic Fluid
 AFI FV:      Within normal limits

                             Largest Pocket(cm)
                             6
Biometry

 BPD:      44.3  mm     G. Age:  19w 3d         68  %    CI:        76.73   %    70 - 86
                                                         FL/HC:      17.8   %    16.1 -
 HC:      160.2  mm     G. Age:  18w 6d         35  %    HC/AC:      1.21        1.09 -
 AC:      132.5  mm     G. Age:  18w 5d         37  %    FL/BPD:     64.3   %
 FL:       28.5  mm     G. Age:  18w 5d         34  %    FL/AC:      21.5   %    20 - 24
 HUM:      25.9  mm     G. Age:  18w 1d         26  %

 LV:        7.5  mm

 Est. FW:     257  gm      0 lb 9 oz     33  %
OB History

 Gravidity:    3         Term:   2        Prem:   0        SAB:   0
 TOP:          0       Ectopic:  0        Living: 2
Gestational Age

 LMP:           19w 0d        Date:  06/30/20                 EDD:   04/06/21
 U/S Today:     19w 0d                                        EDD:   04/06/21
 Best:          19w 0d     Det. By:  LMP  (06/30/20)          EDD:   04/06/21
Anatomy

 Cranium:               Appears normal         LVOT:                   Appears normal
 Cavum:                 Appears normal         Aortic Arch:            Appears normal
 Ventricles:            Appears normal         Ductal Arch:            Appears normal
 Choroid Plexus:        Appears normal         Diaphragm:              Appears normal
 Cerebellum:            Appears normal         Stomach:                Appears normal, left
                                                                       sided
 Posterior Fossa:       Appears normal         Abdomen:                Appears normal
 Nuchal Fold:           Appears normal         Abdominal Wall:         Appears nml (cord
                                                                       insert, abd wall)
 Face:                  Appears normal         Cord Vessels:           Appears normal (3
                        (orbits and profile)                           vessel cord)
 Lips:                  Not well visualized    Kidneys:                Appear normal
 Palate:                Appears normal         Bladder:                Appears normal
 Thoracic:              Appears normal         Spine:                  Appears normal
 Heart:                 Appears normal         Upper Extremities:      Appears normal
                        (4CH, axis, and
                        situs)
 RVOT:                  Appears normal         Lower Extremities:      Appears normal

 Other:  Technically difficult due to fetal position.
Targeted Anatomy

 Head/Neck
 Nasal Bone:            Present                Mandible:               Appears normal
 Orbits/Eyes:           Appears normal         Maxilla:                Appears normal

 Thorax
 SVC:                   Not well visualized    3 V Trachea View:       Not well visualized
 3 Vessel View:         Not well visualized    IVC:                    Not well visualized

 Extremities
 Lt Hand:               Appears normal         Lt Foot:                Appears normal
 Rt Hand:               Not well visualized    Rt Foot:                Appears normal

 Other
 Genitalia:             Normal Female
Cervix Uterus Adnexa
 Cervix
 Length:           3.22  cm.
 Normal appearance by transabdominal scan.

 Uterus
 No abnormality visualized.

 Right Ovary
 Within normal limits.

 Left Ovary
 Within normal limits.

 Cul De Sac
 No free fluid seen.

 Adnexa
 No abnormality visualized.
Impression

 Single intrauterine pregnancy here for a detailed anatomy
 due positive carrier screening for SMA and beta thalassemia
 Normal anatomy with measurements consistent with dates
 There is good fetal movement and amniotic fluid volume
 Suboptimal views of the fetal anatomy were obtained
 secondary to fetal position.

 Ms. Gueen is contemplating genetic counseling.
Recommendations

 Follow up growth in 4-6 weeks.

## 2023-02-03 ENCOUNTER — Encounter: Payer: Medicaid Other | Admitting: Obstetrics and Gynecology

## 2023-02-24 IMAGING — US US MFM OB FOLLOW-UP
1 series · 14 of 28 positions shown · non-contrast
Comparison: none

[Series 1: us mfm ob follow-up · 14 of 29 slices shown]
[im 2/29]
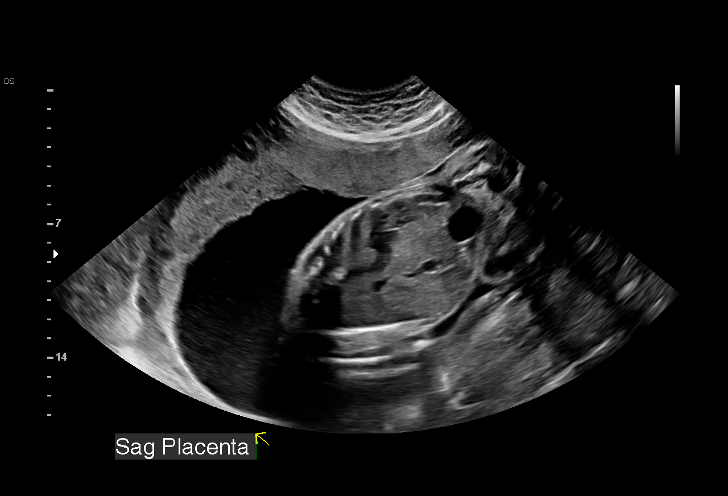
[im 4/29]
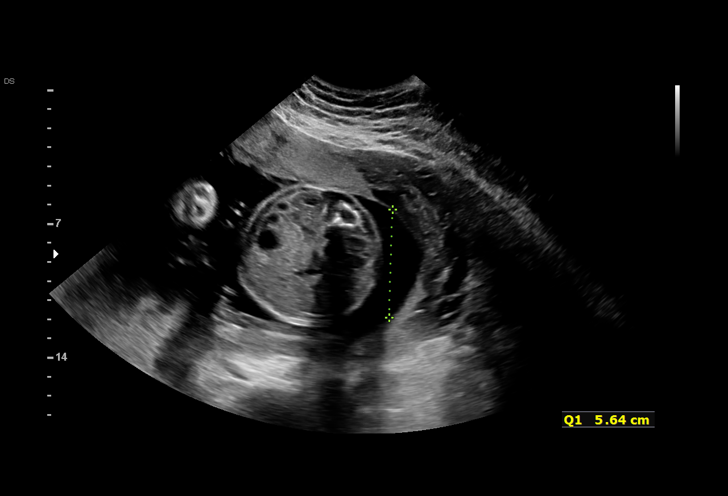
[im 6/29]
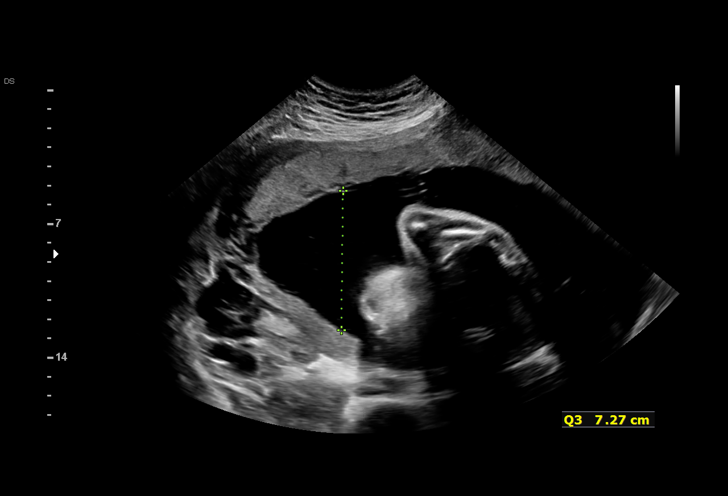
[im 8/29]
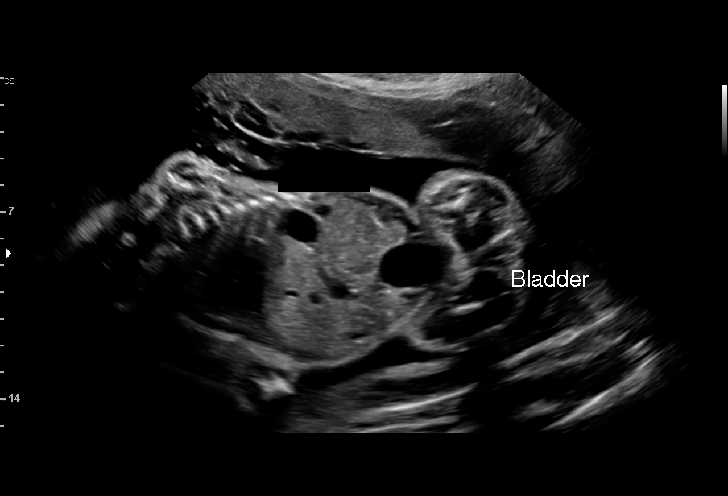
[im 10/29]
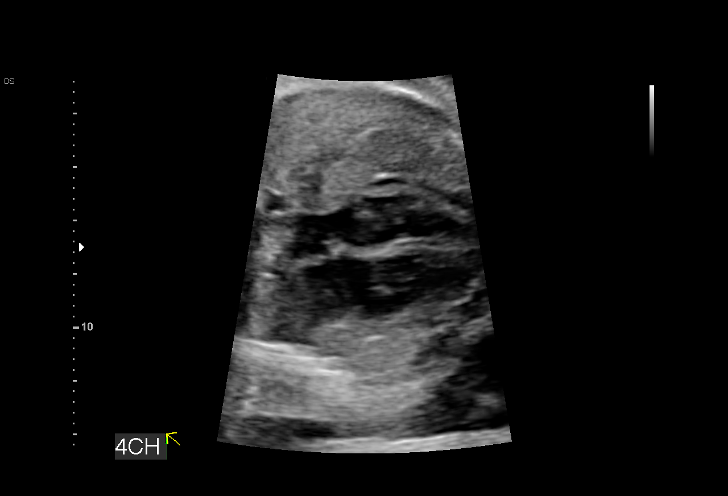
[im 12/29]
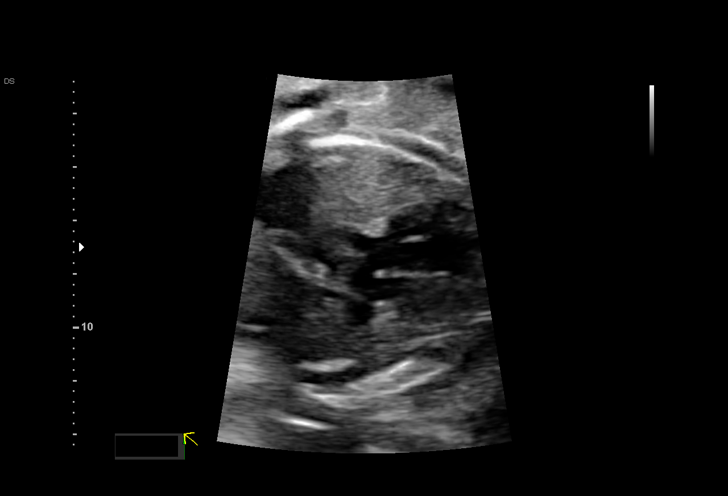
[im 14/29]
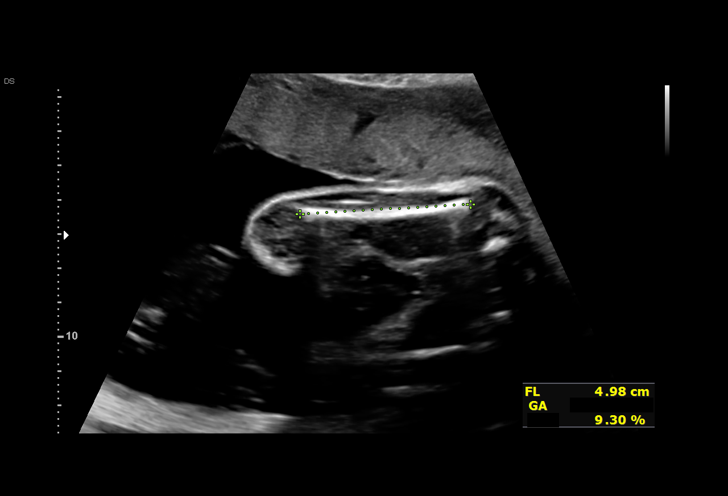
[im 16/29]
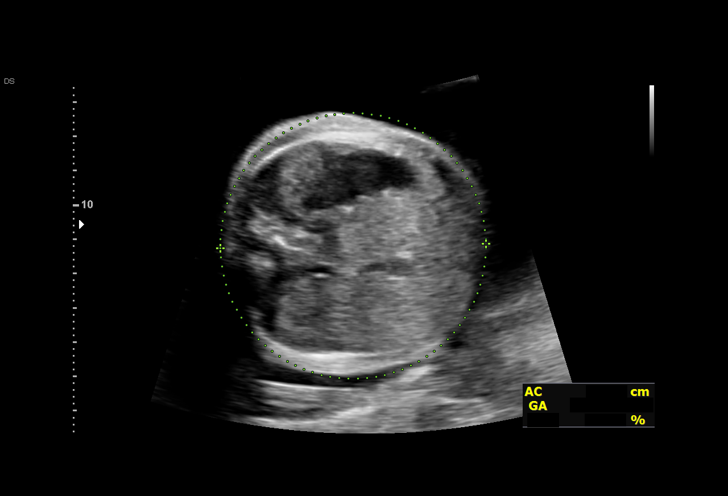
[im 18/29]
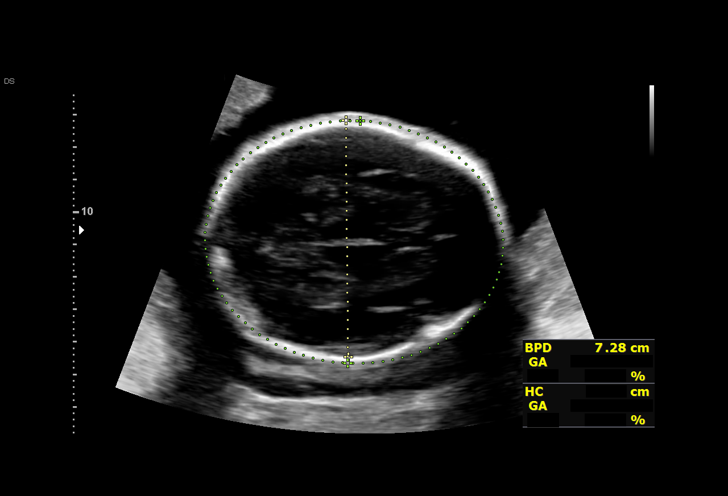
[im 20/29]
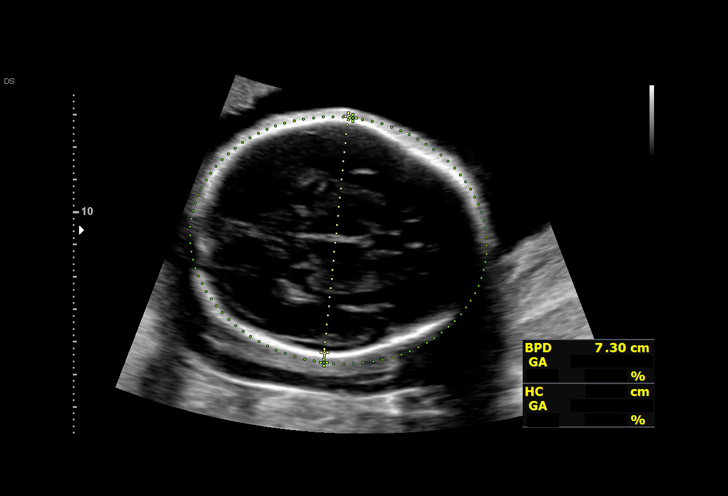
[im 22/29]
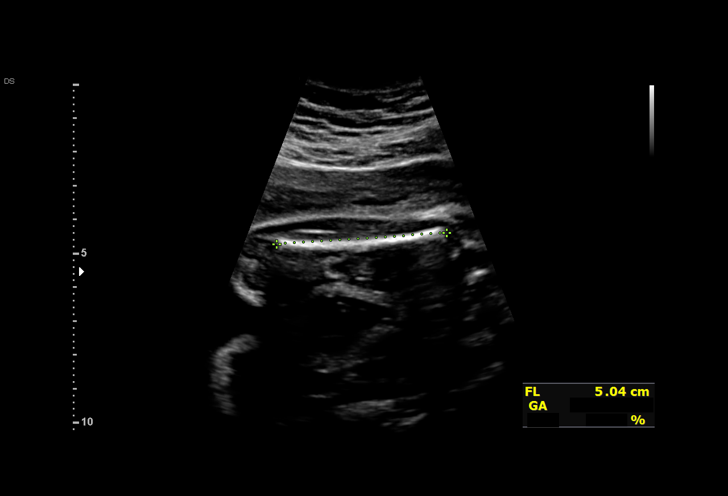
[im 24/29]
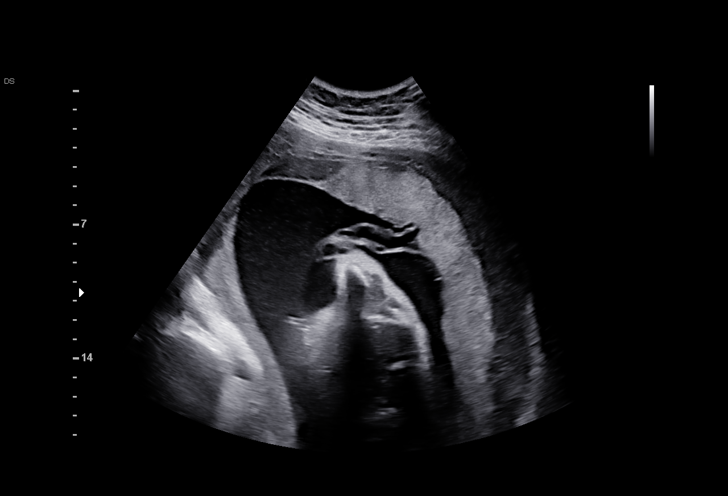
[im 26/29]
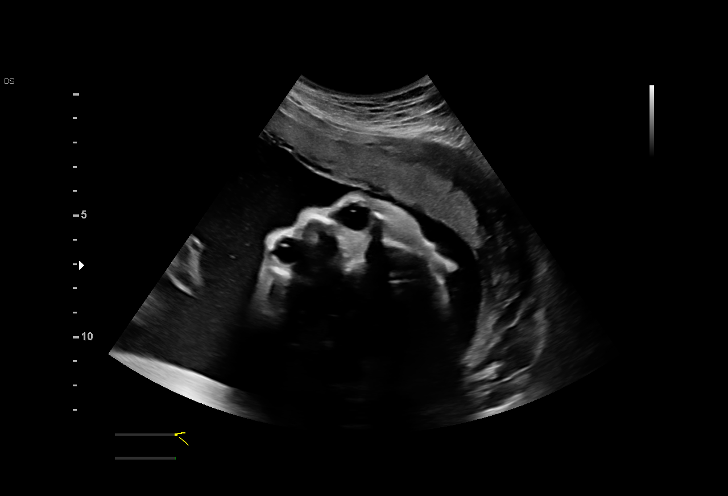
[im 29/29]
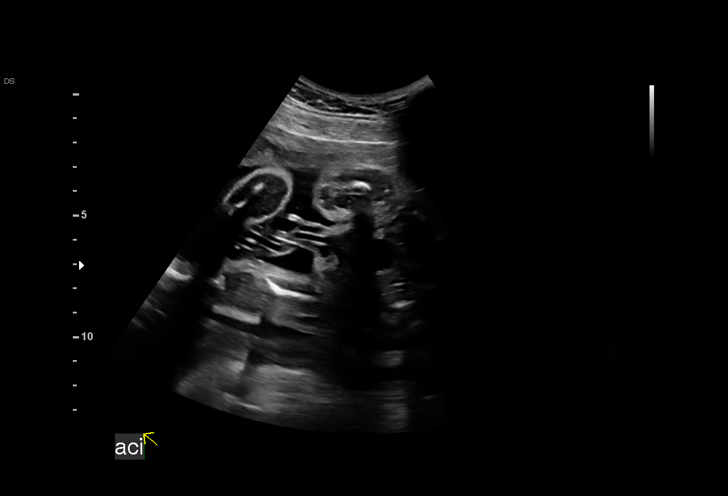

[14 of 28 positions shown; findings below may reference images not displayed]

ZAURO

Indications

 Polyhydramnios, third trimester, antepartum
 condition or complication, unspecified fetus
 Genetic carrier (SMA and Chuppy Justin or beta
 hemoglobinopathy)
 Poor obstetric history: Previous preeclampsia
 Antenatal follow-up for nonvisualized fetal
 anatomy
 Low risk NIPS, neg AFP
 28 weeks gestation of pregnancy
Fetal Evaluation

 Num Of Fetuses:         1
 Cardiac Activity:       Observed
 Presentation:           Breech

 Amniotic Fluid
 AFI FV:      Polyhydramnios

 AFI Sum(cm)     %Tile       Largest Pocket(cm)
 28.37           > 97

 RUQ(cm)       RLQ(cm)       LUQ(cm)        LLQ(cm)

Biometry
 BPD:      72.7  mm     G. Age:  29w 1d         76  %    CI:        77.77   %    70 - 86
                                                         FL/HC:      19.1   %    18.8 -
 HC:      260.9  mm     G. Age:  28w 3d         30  %    HC/AC:      1.08        1.05 -
 AC:      242.2  mm     G. Age:  28w 3d         58  %    FL/BPD:     68.6   %    71 - 87
 FL:       49.9  mm     G. Age:  26w 6d         10  %    FL/AC:      20.6   %    20 - 24

 Est. FW:    3377  gm      2 lb 9 oz     36  %
OB History

 Gravidity:    3         Term:   2        Prem:   0        SAB:   0
 TOP:          0       Ectopic:  0        Living: 2
Gestational Age

 LMP:           28w 0d        Date:  06/30/20                 EDD:   04/06/21
 U/S Today:     28w 2d                                        EDD:   04/04/21
 Best:          28w 0d     Det. By:  LMP  (06/30/20)          EDD:   04/06/21
Anatomy

 Cranium:               Appears normal         Aortic Arch:            Appears normal
 Cavum:                 Appears normal         Stomach:                Appears normal, left
                                                                       sided
 Heart:                 Appears normal         Kidneys:                Appear normal
                        (4CH, axis, and
                        situs)
 RVOT:                  Appears normal         Bladder:                Appears normal
 LVOT:                  Appears normal
Cervix Uterus Adnexa

 Cervix
 Length:           4.08  cm.
 Normal appearance by transabdominal scan.
Impression

 Patient returned for fetal growth assessment.
 Polyhydramnios was seen on previous ultrasound.  Fetal
 anatomical survey was normal.

 On today's ultrasound, mild polyhydramnios is seen (AFI 28
 cm).  Good fetal activity is present.  Fetal growth is
 appropriate for gestational age.  Fetal stomach, kidneys and
 spine appear normal.

 I counseled her on the finding of mild polyhydramnios and
 explained that gestational diabetes is one of the causes of
 polyhydramnios.  Most cases are, however, or idiopathic (no
 known cause) and are associated with good fetal outcomes.
 In some cases, polyhydramnios can be associated with fetal
 anomalies that may be evident only at postnatal life.

 I encouraged her to screen for gestational diabetes next
 week.
Recommendations

 -An appointment was made for her to return in 4 weeks for
 fetal growth assessment.
                 Toietmoi, Jacques Frantz

## 2023-02-27 ENCOUNTER — Other Ambulatory Visit: Payer: Self-pay

## 2023-02-27 ENCOUNTER — Encounter (HOSPITAL_COMMUNITY): Payer: Self-pay

## 2023-02-27 ENCOUNTER — Emergency Department (HOSPITAL_COMMUNITY)
Admission: EM | Admit: 2023-02-27 | Discharge: 2023-02-28 | Discharge status: Against Medical Advice | Payer: Medicaid Other | Attending: Emergency Medicine | Admitting: Emergency Medicine

## 2023-02-27 DIAGNOSIS — R220 Localized swelling, mass and lump, head: Secondary | ICD-10-CM | POA: Insufficient documentation

## 2023-02-27 DIAGNOSIS — L299 Pruritus, unspecified: Secondary | ICD-10-CM | POA: Insufficient documentation

## 2023-02-27 DIAGNOSIS — T7840XA Allergy, unspecified, initial encounter: Secondary | ICD-10-CM

## 2023-02-27 DIAGNOSIS — Z5321 Procedure and treatment not carried out due to patient leaving prior to being seen by health care provider: Secondary | ICD-10-CM | POA: Insufficient documentation

## 2023-02-27 MED ORDER — DEXAMETHASONE SODIUM PHOSPHATE 10 MG/ML IJ SOLN
10.0000 mg | Freq: Once | INTRAMUSCULAR | Status: AC
  Administered 2023-02-27: 10 mg
  Filled 2023-02-27: qty 1

## 2023-02-27 MED ORDER — DIPHENHYDRAMINE HCL 25 MG PO CAPS
50.0000 mg | ORAL_CAPSULE | Freq: Once | ORAL | Status: AC
  Administered 2023-02-27: 50 mg via ORAL
  Filled 2023-02-27: qty 2

## 2023-02-27 MED ORDER — FAMOTIDINE 20 MG PO TABS
20.0000 mg | ORAL_TABLET | Freq: Once | ORAL | Status: AC
  Administered 2023-02-27: 20 mg via ORAL
  Filled 2023-02-27: qty 1

## 2023-02-27 NOTE — ED Notes (Signed)
Called pt 3x

## 2023-02-27 NOTE — ED Provider Triage Note (Signed)
Emergency Medicine Provider Triage Evaluation Note  Debra Lucas , a 27 y.o. female  was evaluated in triage.  Pt complains of possible allergic reaction.  She has known allergy to shellfish.  She was eating chicken earlier today and began having itching to her face, throat felt scratchy, rash to her face.  No difficulty breathing.  No vomiting.  No rash elsewhere to her extremities.  No medications prior to arrival.  No difficulty breathing or swallowing.  Review of Systems  Positive: Facial swelling Negative: Dyspnea dysphagia dysphonia  Physical Exam  BP (!) 129/94   Pulse 92   Temp 98.6 F (37 C)   Resp 18   Ht 5\' 5"  (1.651 m)   Wt 80.4 kg   SpO2 100%   BMI 29.50 kg/m  Gen:   Awake, no distress   Resp:  Normal effort  MSK:   Moves extremities without difficulty  Other:  No drooling stridor or trismus, uvula is midline, no dysphonia  Medical Decision Making  Medically screening exam initiated at 1:36 PM.  Appropriate orders placed.  Debra Lucas was informed that the remainder of the evaluation will be completed by another provider, this initial triage assessment does not replace that evaluation, and the importance of remaining in the ED until their evaluation is complete.  Concern for allergic reaction.  Give Benadryl, Pepcid, steroids.  She is adamant that she is not pregnant does not want to wait for pregnancy test.  Recommend outpatient allergy follow-up   Sloan Leiter, DO 02/27/23 1337

## 2023-02-27 NOTE — ED Triage Notes (Signed)
Pt ate chicken downstairs in the cafeteria. Pt states her throat and gums started itching. Pt states her eyes started swelling. Pt face is erythematous. Pt is eupneic. Pt states hasn't taken anything, because she doesn't have benadryl on her.

## 2023-04-03 ENCOUNTER — Encounter: Payer: Self-pay | Admitting: Allergy & Immunology

## 2023-04-03 ENCOUNTER — Other Ambulatory Visit: Payer: Self-pay

## 2023-04-03 ENCOUNTER — Ambulatory Visit (INDEPENDENT_AMBULATORY_CARE_PROVIDER_SITE_OTHER): Payer: Medicaid Other | Admitting: Allergy & Immunology

## 2023-04-03 VITALS — BP 116/78 | HR 91 | Temp 98.4°F | Resp 20 | Ht 65.0 in | Wt 187.0 lb

## 2023-04-03 DIAGNOSIS — R220 Localized swelling, mass and lump, head: Secondary | ICD-10-CM

## 2023-04-03 DIAGNOSIS — R221 Localized swelling, mass and lump, neck: Secondary | ICD-10-CM | POA: Diagnosis not present

## 2023-04-03 DIAGNOSIS — J302 Other seasonal allergic rhinitis: Secondary | ICD-10-CM | POA: Diagnosis not present

## 2023-04-03 MED ORDER — EPINEPHRINE 0.3 MG/0.3ML IJ SOAJ: 0.3000 mg | INTRAMUSCULAR | 1 refills | Status: AC | PRN

## 2023-04-03 NOTE — Progress Notes (Signed)
NEW PATIENT  Date of Service/Encounter:  04/03/23  Consult requested by: Patient, No Pcp Per   Assessment:   Concern for food allergies (seafood, pork, chicken)  Seasonal allergic rhinitis - did not do testing today  Plan/Recommendations:   1. Swelling of lip, tongue, and throat - We will get testing for pork as well as chicken and seafood. - EpiPen training and script provided. - Emergency Action Plan provided. - We will call you in 10-2 weeks with the results of the testing.   2. Seasonal allergies - We did not do testing since your symptoms were not too severe. - We can always do testing in the future if needed.   3. Return in about 1 year (around 04/02/2024). You can have the follow up appointment with Dr. Dellis Anes or a Nurse Practicioner (our Nurse Practitioners are excellent and always have Physician oversight!).    This note in its entirety was forwarded to the Provider who requested this consultation.  Subjective:   Debra Lucas is a 27 y.o. 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  Discussed the use of AI scribe software for clinical note transcription with the patient and/or guardian, who gave verbal consent to proceed.  Debra Lucas was referred by Patient, No Pcp Per.     Debra Lucas is a 27 y.o. female presenting for evaluation of possible food allergies.  Debra Lucas presents with recent onset  of allergic reactions to pork and chicken. They report that consumption of these meats leads to itching of the fingers and throat, swelling of the lips, and development of bumps on the lips. The reactions have been severe enough to warrant an ER visit four weeks ago after consuming chicken at work. The chicken was reportedly baked with a certain type of sauce, which the patient had consumed without issue in the past.  Interestingly, the patient reports that the reactions are not consistent with all types of chicken. For instance, they can consume certain fried chicken products without any allergic reactions. However, other types of fried chicken, as well as baked chicken prepared at home, trigger the allergic reactions.  The patient also reports a severe reaction to seafood, even to the extent of breaking out in hives upon smelling seafood. They have been avoiding all seafood since childhood due to these severe reactions.  The patient does not have any known allergies to other common food allergens such as peanut butter, tree nuts, milk, eggs, sesame, and soy. They also consume red meats like beef and lamb without any issues.  The patient reports occasional environmental allergies, but these are not severe enough to warrant routine medication. They deny any history of asthma, hives, eczema, or frequent infections. She is fine with not testing for environmental allergies today.   The patient works at a hospital, delivering supplies, and has been doing so for three years. They report no difference in their allergic reactions  at work versus at home.   Otherwise, there is no history of other atopic diseases, including asthma, drug allergies, stinging insect allergies, or contact dermatitis. There is no significant infectious history. Vaccinations are up to date.    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 If you have any questions, ask your nurse or doctor.          STOP taking these medications    Blood Pressure Kit Devi Stopped by: Alfonse Spruce   Prenatal Complete 14-0.4 MG Tabs Stopped by: Alfonse Spruce       TAKE these medications    drospirenone-ethinyl estradiol 3-0.02 MG tablet Commonly known as: YAZ Take 1 tablet by mouth daily. What changed: Another medication with the same name was removed. Continue taking this medication, and follow the directions you see here. Changed by: Alfonse Spruce   EPINEPHrine 0.3 mg/0.3 mL Soaj injection Commonly known as: EpiPen 2-Pak Inject 0.3 mg into the muscle as needed for anaphylaxis. Started by: Alfonse Spruce        Birth History: non-contributory  Developmental History: non-contributory  Past Surgical History: Past Surgical History:  Procedure Laterality Date   ADENOIDECTOMY     CESAREAN SECTION N/A 04/02/2021   Procedure: CESAREAN SECTION;  Surgeon: Milas Hock, MD;  Location: [facility];  Service: Obstetrics;  Laterality: N/A;   TONSILLECTOMY     TYMPANOSTOMY TUBE PLACEMENT       Family History: Family History  Problem Relation Age of Onset   Healthy Mother    Healthy Father    Hypertension Maternal Grandmother      Social History: Debra Lucas lives at home with her family, including 3 daughters ages 51, 42, and 75 years old today.  They live in an apartment.  There is wood in the main living areas and carpeting in the bedroom.  They have electric heating and central cooling.  There are  no animals inside or outside of the home.  There are dust mite covers on the bedding.  There is no tobacco exposure.  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  There is no HEPA filter.  There is no fume, chemical, or dust exposure.  [place]   Review of systems otherwise negative other than that mentioned in the HPI.    Objective:   Blood pressure 116/78, pulse 91, temperature 98.4 F (36.9 C), temperature source Temporal, resp. rate 20, height 5\' 5"  (1.651 m), weight 187 lb (84.8 kg), SpO2 99%, unknown if currently breastfeeding. Body mass index is 31.12 kg/m.     Physical Exam Vitals reviewed.  Constitutional:      Appearance: She is well-developed.  HENT:     Head: Normocephalic and atraumatic.     Right Ear: Tympanic membrane, ear canal and external ear normal. No drainage, swelling or  tenderness. Tympanic membrane is not injected, scarred, erythematous, retracted or bulging.     Left Ear: Tympanic membrane, ear canal and external ear normal. No drainage, swelling or tenderness. Tympanic membrane is not injected, scarred, erythematous, retracted or bulging.     Nose: No nasal deformity, septal deviation, mucosal edema or rhinorrhea.     Right Turbinates: Enlarged, swollen and pale.     Left Turbinates: Enlarged, swollen and pale.     Right Sinus: No maxillary sinus tenderness or frontal sinus tenderness.     Left Sinus: No maxillary sinus tenderness or frontal sinus tenderness.     Mouth/Throat:     Mouth: Mucous membranes are not pale and not dry.     Pharynx: Uvula midline.  Eyes:     General:        Right eye: No discharge.        Left eye: No discharge.     Conjunctiva/sclera: Conjunctivae normal.     Right eye: Right conjunctiva is not injected. No chemosis.    Left eye: Left conjunctiva is not injected. No chemosis.    Pupils: Pupils are equal, round, and reactive to light.   Cardiovascular:     Rate and Rhythm: Normal rate and regular rhythm.     Heart sounds: Normal heart sounds.  Pulmonary:     Effort: Pulmonary effort is normal. No tachypnea, accessory muscle usage or respiratory distress.     Breath sounds: Normal breath sounds. No wheezing, rhonchi or rales.  Chest:     Chest wall: No tenderness.  Abdominal:     Tenderness: There is no abdominal tenderness. There is no guarding or rebound.  Lymphadenopathy:     Head:     Right side of head: No submandibular, tonsillar or occipital adenopathy.     Left side of head: No submandibular, tonsillar or occipital adenopathy.     Cervical: No cervical adenopathy.  Skin:    Coloration: Skin is not pale.     Findings: No abrasion, erythema, petechiae or rash. Rash is not papular, urticarial or vesicular.  Neurological:     Mental Status: She is alert.  Psychiatric:        Behavior: Behavior is cooperative.      Diagnostic studies: labs sent instead        Malachi Bonds, MD [facility]

## 2023-04-03 NOTE — Patient Instructions (Addendum)
1. Swelling of lip, tongue, and throat - We will get testing for pork as well as chicken and seafood. - EpiPen training and script provided. - Emergency Action Plan provided. - We will call you in 10-2 weeks with the results of the testing.   2. Seasonal allergies - We did not do testing since your symptoms were not too severe. - We can always do testing in the future if needed.   3. Return in about 1 year (around 04/02/2024). You can have the follow up appointment with Dr. Dellis Anes or a Nurse Practicioner (our Nurse Practitioners are excellent and always have Physician oversight!).    Please inform us of any Emergency Department visits, hospitalizations, or changes in symptoms. Call us before going to the ED for breathing or allergy symptoms since we might be able to fit you in for a sick visit. Feel free to contact us anytime with any questions, problems, or concerns.  It was a pleasure to meet you today! HBD to your youngest!   Websites that have reliable patient information: 1. American Academy of Asthma, Allergy, and Immunology: www.aaaai.org 2. Food Allergy Research and Education (FARE): foodallergy.org 3. Mothers of Asthmatics: http://www.asthmacommunitynetwork.org 4. American College of Allergy, Asthma, and Immunology: www.acaai.org      "Like" [place] on Facebook and Instagram for our latest updates!      A healthy democracy works best when [employer] participate! Make sure you are registered to vote! If you have moved or changed any of your contact information, you will need to get this updated before voting! Scan the QR codes below to learn more!

## 2023-04-06 LAB — ALLERGY PANEL 19, SEAFOOD GROUP
Allergen Salmon IgE: 8.45 kU/L — AB
Catfish: 26 kU/L — AB
Codfish IgE: 15.6 kU/L — AB
F023-IgE Crab: 8.88 kU/L — AB
F080-IgE Lobster: 8.73 kU/L — AB
Shrimp IgE: 9.32 kU/L — AB
Tuna: 5.46 kU/L — AB

## 2023-04-06 LAB — ALLERGEN, PORK, F26: Pork IgE: <0.1 kU/L

## 2023-04-06 LAB — ALLERGEN, CHICKEN F83: Chicken IgE: 0.41 kU/L — AB

## 2023-04-28 ENCOUNTER — Other Ambulatory Visit: Payer: Self-pay | Admitting: *Deleted

## 2023-04-28 DIAGNOSIS — Z3009 Encounter for other general counseling and advice on contraception: Secondary | ICD-10-CM

## 2023-05-28 ENCOUNTER — Telehealth: Payer: Self-pay

## 2023-05-28 NOTE — Telephone Encounter (Signed)
 Received form from [facility] for request of additional information. Dr.Joel Gallagher,MD has updated dx codes and it has been faxed to [facility] at [phone].

## 2023-06-05 ENCOUNTER — Other Ambulatory Visit: Payer: Self-pay | Admitting: Allergy & Immunology

## 2023-06-05 ENCOUNTER — Other Ambulatory Visit: Payer: Self-pay

## 2023-06-05 MED ORDER — DROSPIRENONE-ETHINYL ESTRADIOL 3-0.02 MG PO TABS: 1.0000 | ORAL_TABLET | Freq: Every day | ORAL | 1 refills | Status: AC

## 2023-06-05 NOTE — Progress Notes (Signed)
One refill RX sent for [facility]. Pt made aware she needs AEX in march to continue.

## 2023-07-01 ENCOUNTER — Emergency Department (HOSPITAL_COMMUNITY)
Admission: EM | Admit: 2023-07-01 | Discharge: 2023-07-01 | Disposition: A | Discharge status: Home / Self Care | Payer: Medicaid Other | Attending: Emergency Medicine | Admitting: Emergency Medicine

## 2023-07-01 ENCOUNTER — Encounter (HOSPITAL_COMMUNITY): Payer: Self-pay

## 2023-07-01 ENCOUNTER — Other Ambulatory Visit: Payer: Self-pay

## 2023-07-01 DIAGNOSIS — Z79899 Other long term (current) drug therapy: Secondary | ICD-10-CM | POA: Insufficient documentation

## 2023-07-01 DIAGNOSIS — I10 Essential (primary) hypertension: Secondary | ICD-10-CM | POA: Diagnosis not present

## 2023-07-01 DIAGNOSIS — T7840XA Allergy, unspecified, initial encounter: Secondary | ICD-10-CM | POA: Insufficient documentation

## 2023-07-01 LAB — PREGNANCY, URINE: Preg Test, Ur: NEGATIVE

## 2023-07-01 MED ORDER — DIPHENHYDRAMINE HCL 25 MG PO CAPS
25.0000 mg | ORAL_CAPSULE | Freq: Once | ORAL | Status: AC
  Administered 2023-07-01: 25 mg via ORAL
  Filled 2023-07-01: qty 1

## 2023-07-01 MED ORDER — FAMOTIDINE 20 MG PO TABS
10.0000 mg | ORAL_TABLET | Freq: Once | ORAL | Status: AC
  Administered 2023-07-01: 10 mg via ORAL
  Filled 2023-07-01: qty 1

## 2023-07-01 MED ORDER — PREDNISONE 20 MG PO TABS
60.0000 mg | ORAL_TABLET | ORAL | Status: AC
  Administered 2023-07-01: 60 mg via ORAL
  Filled 2023-07-01: qty 3

## 2023-07-01 NOTE — ED Provider Notes (Signed)
[place] EMERGENCY DEPARTMENT AT [facility] Provider Note   CSN: [id number] Arrival date & time: 07/01/23  1009     History  Chief Complaint  Patient presents with   Allergic Reaction    Debra Lucas is a 28 y.o. female.   Allergic Reaction Patient history of allergies.  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  Did not eat anything she knows she is allergic to but states her face started to itch.  Feeling somewhat better now.  No difficulty breathing.  No swelling of her mouth.  Has a history of allergies to shellfish and fish.  Has an EpiPen at home but had not used it.    Past Medical History:  Diagnosis Date   Angio-edema    Hx of pre-eclampsia in prior pregnancy, currently pregnant 2017   Hypertension     Home Medications Prior to Admission medications   Medication Sig Start Date End Date Taking? Authorizing Provider  drospirenone-ethinyl estradiol (YAZ) 3-0.02 MG tablet Take 1 tablet by mouth daily. 06/05/23   Constant, Peggy, MD  EPINEPHrine (EPIPEN 2-PAK) 0.3 mg/0.3 mL IJ SOAJ injection Inject 0.3 mg into the muscle as needed for anaphylaxis. 04/03/23   Alfonse Spruce, MD      Allergies    Shellfish allergy    Review of Systems   Review of Systems  Physical Exam Updated Vital Signs BP 122/71 (BP Location: Right Arm)   Pulse 65   Temp 98.1 F (36.7 C)   Resp 16   Ht 5\' 5"  (1.651 m)   Wt 84.8 kg   SpO2 100%   BMI 31.12 kg/m  Physical Exam Vitals and nursing note reviewed.  HENT:     Head:     Comments: Mild hives on face.  Possible mild swelling of upper lip.  No posterior pharyngeal edema. Neck:     Comments: No stridor. Pulmonary:     Breath sounds: No wheezing.  Neurological:     Mental Status: She is alert.     ED Results / Procedures / Treatments   Labs (all labs ordered are listed, but only abnormal results are displayed) Labs Reviewed  PREGNANCY, URINE     EKG None  Radiology No results found.  Procedures Procedures    Medications Ordered in ED Medications  diphenhydrAMINE (BENADRYL) capsule 25 mg (25 mg Oral Given 07/01/23 1027)  famotidine (PEPCID) tablet 10 mg (10 mg Oral Given 07/01/23 1027)  predniSONE (DELTASONE) tablet 60 mg (60 mg Oral Given 07/01/23 1027)    ED Course/ Medical Decision Making/ A&P                                 Medical Decision Making  Patient with level of appearing with potential allergic reaction to food.  Thinks may be skin contact.  Slight itching on face.  Has had Benadryl Pepcid and prednisone.  States she is feeling better.  She works in the hospital I think is reasonable to discharge at this time.  She would like to go back to work.  Will follow-up with her allergist as needed.  Does not appear to be a severe angioedema at this time.        Final Clinical Impression(s) / ED Diagnoses Final diagnoses:  Allergic reaction, initial encounter    Rx / DC Orders ED Discharge Orders     None  Benjiman Core, MD 07/01/23 [phone]

## 2023-07-01 NOTE — ED Triage Notes (Signed)
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  Skin around right eye is red and itchy.  Denies throat itching swelling or tongue swelling. No difficutly swallowing or breathing.

## 2023-07-23 ENCOUNTER — Ambulatory Visit
Admission: EM | Admit: 2023-07-23 | Discharge: 2023-07-23 | Disposition: A | Discharge status: Home / Self Care | Attending: Family Medicine | Admitting: Family Medicine

## 2023-07-23 DIAGNOSIS — L03213 Periorbital cellulitis: Secondary | ICD-10-CM | POA: Diagnosis not present

## 2023-07-23 DIAGNOSIS — J018 Other acute sinusitis: Secondary | ICD-10-CM

## 2023-07-23 DIAGNOSIS — H109 Unspecified conjunctivitis: Secondary | ICD-10-CM

## 2023-07-23 MED ORDER — AMOXICILLIN-POT CLAVULANATE 875-125 MG PO TABS: 1.0000 | ORAL_TABLET | Freq: Two times a day (BID) | ORAL | 0 refills | Status: DC

## 2023-07-23 MED ORDER — CETIRIZINE HCL 10 MG PO TABS: 10.0000 mg | ORAL_TABLET | Freq: Every day | ORAL | 0 refills | Status: AC

## 2023-07-23 MED ORDER — PSEUDOEPHEDRINE HCL 60 MG PO TABS: 60.0000 mg | ORAL_TABLET | Freq: Three times a day (TID) | ORAL | 0 refills | Status: AC | PRN

## 2023-07-23 MED ORDER — IBUPROFEN 600 MG PO TABS: 600.0000 mg | ORAL_TABLET | Freq: Four times a day (QID) | ORAL | 0 refills | Status: DC | PRN

## 2023-07-23 NOTE — Discharge Instructions (Signed)
 You have a deep space sinus infection complicating to preseptal cellulitis and pink eye. Please make sure you take Augmentin to completion. Use Zyrtec, Sudafed and ibuprofen for supportive care the rest of this week and then as needed.

## 2023-07-23 NOTE — ED Triage Notes (Signed)
 Pt reports she has left eye pain/ soreness under eye and redness x 6 days.  States she also has some nasal congestion

## 2023-07-23 NOTE — ED Provider Notes (Signed)
 [facility] - URGENT [facility]ARE C[facility]  Note:  This document was prepared using Conservation officer, historic buildings and may include unintentional dictation errors.  MRN: [id number] DOB: August 23, 1995  Subjective:   Debra Lucas is a 28 y.o. female presenting for 6-day history of persistent left-sided facial fullness, pain around her eye and sinus.  Has had tenderness of the left lower eyelid, sinus congestion and drainage.  No fever, cough, chest pain, shortness of breath or wheezing.  No current facility-administered medications for this encounter.  Current Outpatient Medications:    drospirenone-ethinyl estradiol (YAZ) 3-0.02 MG tablet, Take 1 tablet by mouth daily., Disp: 28 tablet, Rfl: 1   EPINEPHrine (EPIPEN 2-PAK) 0.3 mg/0.3 mL IJ SOAJ injection, Inject 0.3 mg into the muscle as needed for anaphylaxis., Disp: 0.3 mL, Rfl: 1   Allergies  Allergen Reactions   Shellfish Allergy Swelling and Rash    Past Medical History:  Diagnosis Date   Angio-edema    Hx of pre-eclampsia in prior pregnancy, currently pregnant 2017   Hypertension      Past Surgical History:  Procedure Laterality Date   ADENOIDECTOMY     CESAREAN SECTION N/A 04/02/2021   Procedure: CESAREAN SECTION;  Surgeon: Milas Hock, MD;  Location: [facility] LD ORS;  Service: Obstetrics;  Laterality: N/A;   TONSILLECTOMY     TYMPANOSTOMY TUBE PLACEMENT      Family History  Problem Relation Age of Onset   Healthy Mother    Healthy Father    Hypertension Maternal Grandmother     Social History   Tobacco Use   Smoking status: Never    Passive exposure: Past   Smokeless tobacco: Never  Vaping Use   Vaping status: Never Used  Substance Use Topics   Alcohol use: No   Drug use: No    ROS   Objective:   Vitals: BP 118/76 (BP Location: Left Arm)   Pulse 95   Temp 98.9 F (37.2 C) (Oral)   Resp 16   LMP 06/26/2023   SpO2 97%   Physical Exam Constitutional:      General: She is not in acute  distress.    Appearance: Normal appearance. She is well-developed and normal weight. She is not ill-appearing, toxic-appearing or diaphoretic.  HENT:     Head: Normocephalic and atraumatic.     Right Ear: Tympanic membrane, ear canal and external ear normal. No drainage or tenderness. No middle ear effusion. There is no impacted cerumen. Tympanic membrane is not erythematous or bulging.     Left Ear: Tympanic membrane, ear canal and external ear normal. No drainage or tenderness.  No middle ear effusion. There is no impacted cerumen. Tympanic membrane is not erythematous or bulging.     Nose: Congestion present. No rhinorrhea.     Left Sinus: Maxillary sinus tenderness present.     Mouth/Throat:     Mouth: Mucous membranes are moist. No oral lesions.     Pharynx: No pharyngeal swelling, oropharyngeal exudate, posterior oropharyngeal erythema or uvula swelling.     Tonsils: No tonsillar exudate or tonsillar abscesses.  Eyes:     General: Lids are everted, no foreign bodies appreciated. Vision grossly intact. No scleral icterus.       Right eye: No foreign body, discharge or hordeolum.        Left eye: No foreign body, discharge or hordeolum.     Extraocular Movements: Extraocular movements intact.     Right eye: Normal extraocular motion.     Left  eye: Normal extraocular motion and no nystagmus.     Conjunctiva/sclera:     Right eye: Right conjunctiva is not injected. No chemosis, exudate or hemorrhage.    Left eye: Left conjunctiva is injected. No chemosis, exudate or hemorrhage.  Cardiovascular:     Rate and Rhythm: Normal rate.  Pulmonary:     Effort: Pulmonary effort is normal.  Musculoskeletal:     Cervical back: Normal range of motion and neck supple.  Lymphadenopathy:     Cervical: No cervical adenopathy.  Skin:    General: Skin is warm and dry.  Neurological:     General: No focal deficit present.     Mental Status: She is alert and oriented to person, place, and time.      Cranial Nerves: No cranial nerve deficit.     Motor: No weakness.     Coordination: Coordination normal.     Gait: Gait normal.  Psychiatric:        Mood and Affect: Mood normal.        Behavior: Behavior normal.     Assessment and Plan :   PDMP not reviewed this encounter.  1. Preseptal cellulitis of left lower eyelid   2. Bacterial conjunctivitis of left eye   3. Other acute sinusitis, recurrence not specified    Recommended Augmentin, supportive care.  Will cover for developing bacterial conjunctivitis as well with tobramycin.  Counseled patient on potential for adverse effects with medications prescribed/recommended today, ER and return-to-clinic precautions discussed, patient verbalized understanding.    Wallis Bamberg, [place] 07/23/23 [id number]

## 2023-08-15 ENCOUNTER — Other Ambulatory Visit (HOSPITAL_COMMUNITY)
Admission: RE | Admit: 2023-08-15 | Discharge: 2023-08-15 | Disposition: A | Discharge status: Home / Self Care | Source: Ambulatory Visit | Attending: Nurse Practitioner | Admitting: Nurse Practitioner

## 2023-08-15 ENCOUNTER — Encounter: Payer: Self-pay | Admitting: Nurse Practitioner

## 2023-08-15 ENCOUNTER — Telehealth: Payer: Self-pay | Admitting: General Practice

## 2023-08-15 ENCOUNTER — Ambulatory Visit: Payer: Medicaid Other | Admitting: Nurse Practitioner

## 2023-08-15 VITALS — BP 110/70 | HR 80 | Ht 65.0 in | Wt 194.8 lb

## 2023-08-15 DIAGNOSIS — Z113 Encounter for screening for infections with a predominantly sexual mode of transmission: Secondary | ICD-10-CM | POA: Insufficient documentation

## 2023-08-15 DIAGNOSIS — Z30012 Encounter for prescription of emergency contraception: Secondary | ICD-10-CM | POA: Diagnosis not present

## 2023-08-15 DIAGNOSIS — Z3009 Encounter for other general counseling and advice on contraception: Secondary | ICD-10-CM | POA: Diagnosis not present

## 2023-08-15 DIAGNOSIS — Z8742 Personal history of other diseases of the female genital tract: Secondary | ICD-10-CM

## 2023-08-15 DIAGNOSIS — Z3202 Encounter for pregnancy test, result negative: Secondary | ICD-10-CM

## 2023-08-15 LAB — POCT URINE PREGNANCY: Preg Test, Ur: NEGATIVE

## 2023-08-15 MED ORDER — LEVONORGESTREL 1.5 MG PO TABS: 1.5000 mg | ORAL_TABLET | Freq: Once | ORAL | 0 refills | Status: AC

## 2023-08-15 MED ORDER — MEDROXYPROGESTERONE ACETATE 150 MG/ML IM SUSP: 150.0000 mg | INTRAMUSCULAR | 0 refills | Status: DC

## 2023-08-15 NOTE — Progress Notes (Signed)
 History:  [name] is a 28 y.o. [phone] who presents to clinic today for STI screening and for Contraceptive management. She reports previously on the patch, and pills which she forgets to take and the patch would not stay on. She reports unprotected IC yesterday and desires Plan B and would like to start Depo Injections.  Offers no GI/GU complaints today. She previously had an abnormal pap on 08/13/22 LGSIL and was unable to schedule colposcopy - which she said she would do today.   She denies any lumps on SBE and denies any [facility] h/o of breast cancer    The following portions of the patient's history were reviewed and updated as appropriate: allergies, current medications, family history, past medical history, social history, past surgical history and problem list.  Review of Systems:  Review of Systems  All other systems reviewed and are negative. Unless otherwise documented in HPI    Objective:  Physical Exam BP 110/70   Pulse 80   Ht 5\' 5"  (1.651 m)   Wt 194 lb 12.8 oz (88.4 kg)   LMP 08/04/2023   BMI 32.42 kg/m  Physical Exam Vitals and nursing note reviewed.  Constitutional:      General: She is not in acute distress.    Appearance: Normal appearance. She is obese. She is not ill-appearing.  HENT:     Head: Normocephalic.     Mouth/Throat:     Mouth: Mucous membranes are moist.  Cardiovascular:     Rate and Rhythm: Normal rate and regular rhythm.  Pulmonary:     Effort: Pulmonary effort is normal.     Breath sounds: Normal breath sounds.  Abdominal:     General: There is no distension.     Palpations: Abdomen is soft. There is no mass.     Tenderness: There is no abdominal tenderness. There is no right CVA tenderness, left CVA tenderness, guarding or rebound.  Musculoskeletal:        General: Normal range of motion.     Cervical back: Normal range of motion.  Skin:    General: Skin is warm.  Neurological:     Mental Status: She is alert and oriented to  person, place, and time.  Psychiatric:        Mood and Affect: Mood normal.        Behavior: Behavior normal.     Assessment & Plan:   1. Routine screening for STI (sexually transmitted infection) (Primary) - Cervicovaginal ancillary only( [place]) - HIV antibody (with reflex) - RPR - Hepatitis C Antibody - Hepatitis B Surface AntiGEN - POCT urine pregnancy  2. Encounter for emergency contraception - levonorgestrel (PLAN B ONE-STEP) 1.5 MG tablet; Take 1 tablet (1.5 mg total) by mouth once for 1 dose.  Dispense: 1 tablet; Refill: 0  3. Encounter for contraceptive planning - medroxyPROGESTERone (DEPO-PROVERA) 150 MG/ML injection; Inject 1 mL (150 mg total) into the muscle every 3 (three) months.  Dispense: 1 mL; Refill: 0  4. H/O abnormal cervical Papanicolaou smear  - Schedule Colposcopy   Colman Cater, NP 08/15/2023 8:37 AM

## 2023-08-15 NOTE — Progress Notes (Signed)
 Pt presents for AEX.  Last PAP 08-13-23 Did not have colpo. Requesting PAP and STD testing  Pt interesting in Depo. Last unprotected sex yesterday.

## 2023-08-15 NOTE — Addendum Note (Signed)
 Addended by: Jearld Adjutant on: 08/15/2023 09:49 AM   Modules accepted: Orders

## 2023-08-16 LAB — HIV ANTIBODY (ROUTINE TESTING W REFLEX): HIV Screen 4th Generation wRfx: NONREACTIVE

## 2023-08-16 LAB — RPR: RPR Ser Ql: NONREACTIVE

## 2023-08-16 LAB — HEPATITIS B SURFACE ANTIGEN: Hepatitis B Surface Ag: NEGATIVE

## 2023-08-16 LAB — HEPATITIS C ANTIBODY: Hep C Virus Ab: NONREACTIVE

## 2023-08-18 ENCOUNTER — Telehealth

## 2023-08-18 LAB — CERVICOVAGINAL ANCILLARY ONLY
Chlamydia: NEGATIVE
Comment: NEGATIVE
Comment: NEGATIVE
Comment: NORMAL
Neisseria Gonorrhea: NEGATIVE
Trichomonas: NEGATIVE

## 2023-08-29 ENCOUNTER — Ambulatory Visit

## 2023-08-29 ENCOUNTER — Telehealth: Admitting: Family Medicine

## 2023-08-29 NOTE — Progress Notes (Signed)
 Pt did not show for visit [facility]

## 2023-09-04 ENCOUNTER — Ambulatory Visit: Admitting: Obstetrics and Gynecology

## 2023-09-04 ENCOUNTER — Other Ambulatory Visit (HOSPITAL_COMMUNITY)
Admission: RE | Admit: 2023-09-04 | Discharge: 2023-09-04 | Disposition: A | Discharge status: Home / Self Care | Source: Ambulatory Visit | Attending: Obstetrics and Gynecology | Admitting: Obstetrics and Gynecology

## 2023-09-04 VITALS — BP 103/69 | HR 80 | Wt 195.0 lb

## 2023-09-04 DIAGNOSIS — R87612 Low grade squamous intraepithelial lesion on cytologic smear of cervix (LGSIL): Secondary | ICD-10-CM | POA: Insufficient documentation

## 2023-09-04 DIAGNOSIS — R87810 Cervical high risk human papillomavirus (HPV) DNA test positive: Secondary | ICD-10-CM

## 2023-09-04 NOTE — Progress Notes (Signed)
    GYNECOLOGY OFFICE COLPOSCOPY PROCEDURE NOTE  28 y.o. [id number] here for colposcopy for LSIL pap smear on 07/2022. Discussed role for HPV in cervical dysplasia, need for surveillance.  We reviewed that since it has been a year since her last pap, we could just do a pap today or we could do a pap with colposcopy/biopsies. She chooses pap with colposcopy/biopsies.  Reviewed the risks of pain, bleeding, infection, inadequate sample. Patient gave informed written consent, time out was performed.  Placed in lithotomy position. Cervix viewed with speculum and colposcope after application of acetic acid.   Colposcopy adequate? Yes  acetowhite lesion(s) noted at 10 and 2 o'clock; corresponding biopsies obtained.  ECC specimen obtained. All specimens were labeled and sent to pathology.  Chaperone was present during entire procedure.  Patient was given post procedure instructions.  Will follow up pathology and manage accordingly; patient will be contacted with results and recommendations.     Marci Setter, MD, FACOG Obstetrician & Gynecologist, [facility] for [facility], [facility]

## 2023-09-05 LAB — SURGICAL PATHOLOGY

## 2023-09-09 ENCOUNTER — Encounter: Payer: Self-pay | Admitting: Obstetrics and Gynecology

## 2023-09-09 LAB — CYTOLOGY - PAP: Diagnosis: NEGATIVE

## 2023-09-10 ENCOUNTER — Ambulatory Visit
Admission: EM | Admit: 2023-09-10 | Discharge: 2023-09-10 | Disposition: A | Discharge status: Home / Self Care | Attending: Family Medicine | Admitting: Family Medicine

## 2023-09-10 DIAGNOSIS — L301 Dyshidrosis [pompholyx]: Secondary | ICD-10-CM

## 2023-09-10 MED ORDER — TRIAMCINOLONE ACETONIDE 0.1 % EX CREA: 1.0000 | TOPICAL_CREAM | Freq: Two times a day (BID) | CUTANEOUS | 0 refills | Status: AC

## 2023-09-10 NOTE — ED Triage Notes (Signed)
 Pt presents with itchy rash on her hands x 4 years that come and go but now the rash has spread to her elbows and forearm x 1 week. Pt states she tried hydrocortisone cream, and Vaseline.

## 2023-09-10 NOTE — Discharge Instructions (Signed)
 Start Kenalog  topical steroid cream twice daily to the affected areas as needed.  You may continue over-the-counter Vaseline or use Aquaphor lotion frequently to there is help with hydration.  I would also start an allergy  medicine such as Claritin or Zyrtec  daily for at least 14 days.  Follow-up with your PCP or dermatology for further treatment options.  Please go to the ER for any worsening symptoms.  Hope you feel better soon!

## 2023-09-10 NOTE — ED Provider Notes (Signed)
 [facility]    CSN: [id number] Arrival date & time: 09/10/23  1348      History   Chief Complaint Chief Complaint  Patient presents with   Rash    HPI Debra Lucas is a 28 y.o. female presents for rash.  Patient reports an intermittent rash that is dry and itchy on her hands over the past 4 years.  Reports over the past week it seems to have come back and she has also noticed areas on her axillas bilaterally.  No pain drainage swelling fevers or chills.  Denies history of eczema.  She has been using Vaseline OTC.  No other new contacts including soaps, medications, detergents, etc.  No other concerns at this time.   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   Abnormal uterine bleeding (AUB) 04/08/2022   Contraceptive patch status 04/08/2022   History of cesarean section 05/02/2021   History of group B Streptococcus (GBS) infection 07/16/2016   History of gestational hypertension 07/16/2016    Past Surgical History:  Procedure Laterality Date   ADENOIDECTOMY     CESAREAN SECTION N/A 04/02/2021   Procedure: CESAREAN SECTION;  Surgeon: Lacey Pian, MD;  Location: [facility];  Service: Obstetrics;  Laterality: N/A;   TONSILLECTOMY     TYMPANOSTOMY TUBE PLACEMENT      OB History     Gravida  4   Para  3   Term  3   Preterm      AB  1   Living  3      SAB      IAB  1   Ectopic      Multiple  0   Live Births  3            Home Medications    Prior to Admission medications   Medication Sig Start Date End Date Taking? Authorizing Provider  triamcinolone  cream (KENALOG ) 0.1 % Apply 1 Application topically 2 (two) times daily. 09/10/23  Yes Mahagony Grieb, Jodi R, NP  amoxicillin -clavulanate (AUGMENTIN ) 875-125 MG tablet Take 1 tablet by mouth  2 (two) times daily. Patient not taking: Reported on 08/15/2023 07/23/23   Adolph Hoop, PA-C  cetirizine  (ZYRTEC  ALLERGY ) 10 MG tablet Take 1 tablet (10 mg total) by mouth daily. 07/23/23   Adolph Hoop, PA-C  drospirenone -ethinyl estradiol  (YAZ) 3-0.02 MG tablet Take 1 tablet by mouth daily. Patient not taking: Reported on 08/15/2023 06/05/23   Constant, Peggy, MD  EPINEPHrine  (EPIPEN  2-PAK) 0.3 mg/0.3 mL IJ SOAJ injection Inject 0.3 mg into the muscle as needed for anaphylaxis. 04/03/23   Rochester Chuck, MD  ibuprofen  (ADVIL ) 600 MG tablet Take 1 tablet (600 mg total) by mouth every 6 (six) hours as needed. 07/23/23   Adolph Hoop, PA-C  medroxyPROGESTERone  (DEPO-PROVERA ) 150 MG/ML injection Inject 1 mL (150 mg total) into the muscle every 3 (three) months. 08/15/23   Cherlynn Cornfield, NP  pseudoephedrine  (SUDAFED) 60 MG tablet Take 1 tablet (60 mg total) by mouth every 8 (eight) hours as needed for congestion. Patient not taking: Reported on 08/15/2023 07/23/23   Adolph Hoop, PA-C    Family History Family History  Problem Relation Age of Onset   Healthy Mother    Healthy Father  Hypertension Maternal Grandmother     Social History Social History   Tobacco Use   Smoking status: Never    Passive exposure: Past   Smokeless tobacco: Never  Vaping Use   Vaping status: Never Used  Substance Use Topics   Alcohol use: No   Drug use: No     Allergies   Shellfish allergy    Review of Systems Review of Systems  Skin:  Positive for rash.     Physical Exam Triage Vital Signs ED Triage Vitals  Encounter Vitals Group     BP 09/10/23 1434 118/74     Systolic BP Percentile --      Diastolic BP Percentile --      Pulse Rate 09/10/23 1434 86     Resp 09/10/23 1434 16     Temp 09/10/23 1434 98.6 F (37 C)     Temp Source 09/10/23 1434 Oral     SpO2 09/10/23 1434 99 %     Weight --      Height --      Head Circumference --      Peak Flow --      Pain Score 09/10/23 1435 0      Pain Loc --      Pain Education --      Exclude from Growth Chart --    No data found.  Updated Vital Signs BP 118/74 (BP Location: Right Arm)   Pulse 86   Temp 98.6 F (37 C) (Oral)   Resp 16   LMP 09/05/2023   SpO2 99%   Breastfeeding No   Visual Acuity Right Eye Distance:   Left Eye Distance:   Bilateral Distance:    Right Eye Near:   Left Eye Near:    Bilateral Near:     Physical Exam Vitals and nursing note reviewed.  Constitutional:      Appearance: Normal appearance.  HENT:     Head: Normocephalic and atraumatic.  Eyes:     Pupils: Pupils are equal, round, and reactive to light.  Cardiovascular:     Rate and Rhythm: Normal rate.  Pulmonary:     Effort: Pulmonary effort is normal.  Skin:    General: Skin is warm and dry.     Comments: Dry scaling skin/dermatitis on palms of bilateral hands as well as lateral aspects of fingers and to bilateral antecubital areas.  Small scattered vesicles noted to palms and fingers.  Neurological:     General: No focal deficit present.     Mental Status: She is alert and oriented to person, place, and time.  Psychiatric:        Mood and Affect: Mood normal.        Behavior: Behavior normal.      UC Treatments / Results  Labs (all labs ordered are listed, but only abnormal results are displayed) Labs Reviewed - No data to display  EKG   Radiology No results found.  Procedures Procedures (including critical care time)  Medications Ordered in UC Medications - No data to display  Initial Impression / Assessment and Plan / UC Course  I have reviewed the triage vital signs and the nursing notes.  Pertinent labs & imaging results that were available during my care of the patient were reviewed by me and considered in my medical decision making (see chart for details).     Reviewed exam and symptoms with patient.  No red flags.  Discussed symptoms consistent with dyshidrotic eczema.  Will  do trial of topical  Kenalog  twice daily.  Discussed continued Vaseline or using over-the-counter Aquaphor lotion frequently for hydration.  Also advised starting an over-the-counter allergy  medicine such as Claritin or Zyrtec  daily for at least 14 days.  Will give contact information for patient to follow-up with dermatology for further treatment options.  ER precautions reviewed and patient verbalized understanding. Final Clinical Impressions(s) / UC Diagnoses   Final diagnoses:  Dyshidrotic eczema     Discharge Instructions      Start Kenalog  topical steroid cream twice daily to the affected areas as needed.  You may continue over-the-counter Vaseline or use Aquaphor lotion frequently to there is help with hydration.  I would also start an allergy  medicine such as Claritin or Zyrtec  daily for at least 14 days.  Follow-up with your PCP or dermatology for further treatment options.  Please go to the ER for any worsening symptoms.  Hope you feel better soon!    ED Prescriptions     Medication Sig Dispense Auth. Provider   triamcinolone  cream (KENALOG ) 0.1 % Apply 1 Application topically 2 (two) times daily. 45 g Justus Duerr, Jodi R, NP      PDMP not reviewed this encounter.   Alleen Arbour, NP 09/10/23 1450

## 2023-10-15 ENCOUNTER — Ambulatory Visit: Admitting: Family Medicine

## 2023-11-14 ENCOUNTER — Other Ambulatory Visit: Payer: Self-pay | Admitting: Nurse Practitioner

## 2023-11-14 DIAGNOSIS — Z3009 Encounter for other general counseling and advice on contraception: Secondary | ICD-10-CM

## 2023-12-15 ENCOUNTER — Ambulatory Visit: Admitting: Family Medicine

## 2024-01-10 ENCOUNTER — Ambulatory Visit
Admission: RE | Admit: 2024-01-10 | Discharge: 2024-01-10 | Disposition: A | Discharge status: Home / Self Care | Source: Ambulatory Visit | Attending: Family Medicine

## 2024-01-10 ENCOUNTER — Other Ambulatory Visit: Payer: Self-pay

## 2024-01-10 VITALS — BP 110/75 | HR 91 | Temp 99.0°F | Resp 16

## 2024-01-10 DIAGNOSIS — K047 Periapical abscess without sinus: Secondary | ICD-10-CM

## 2024-01-10 MED ORDER — NAPROXEN 500 MG PO TABS: 500.0000 mg | ORAL_TABLET | Freq: Two times a day (BID) | ORAL | 0 refills | Status: AC | PRN

## 2024-01-10 MED ORDER — AMOXICILLIN-POT CLAVULANATE 875-125 MG PO TABS: 1.0000 | ORAL_TABLET | Freq: Two times a day (BID) | ORAL | 0 refills | Status: AC

## 2024-01-10 NOTE — ED Notes (Signed)
 I provided pt with the sheet that has dentist for people without insurance.

## 2024-01-10 NOTE — ED Triage Notes (Signed)
 Pt c/o mouth abscess and right facial swellingx2d. Pt has 2+ swelling of right side of face. Pt is able to maintain oral secretions. Pt states she doesn't have a dentist or dental insurance.

## 2024-01-10 NOTE — Discharge Instructions (Addendum)
 Start Augmentin  twice daily for 10 days.  You may take naproxen  twice daily as needed for pain.  Do salt water gargles you may also take over-the-counter Tylenol  if needed.  Please follow-up with a dentist ASAP for further treatment as antibiotics are a temporary fix to the underlying issue.  Please go to the ER if you develop any worsening symptoms.  This includes but is not limited to fevers, worsening facial swelling, or any new concerns that arise.  I hope you feel better soon!

## 2024-01-10 NOTE — ED Provider Notes (Signed)
 [facility]    CSN: [id number] Arrival date & time: 01/10/24  9163      History   Chief Complaint Chief Complaint  Patient presents with   Facial Swelling    HPI Debra Lucas is a 28 y.o. female presents for dental infection.  Patient reports 2 days of a right lower dental pain with facial swelling.  no fevers.  Reports she has some decaying teeth to the area but she does not have dental insurance and has not seen a dentist in some time.  She has been using over-the-counter Tylenol  for symptoms.  No other concerns at this time.  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   Abnormal uterine bleeding (AUB) 04/08/2022   Contraceptive patch status 04/08/2022   History of cesarean section 05/02/2021   History of group B Streptococcus (GBS) infection 07/16/2016   History of gestational hypertension 07/16/2016    Past Surgical History:  Procedure Laterality Date   ADENOIDECTOMY     CESAREAN SECTION N/A 04/02/2021   Procedure: CESAREAN SECTION;  Surgeon: Cleatus Moccasin, MD;  Location: [facility];  Service: Obstetrics;  Laterality: N/A;   TONSILLECTOMY     TYMPANOSTOMY TUBE PLACEMENT      OB History     Gravida  4   Para  3   Term  3   Preterm      AB  1   Living  3      SAB      IAB  1   Ectopic      Multiple  0   Live Births  3            Home Medications    Prior to Admission medications   Medication Sig Start Date End Date Taking? Authorizing Provider  amoxicillin -clavulanate (AUGMENTIN ) 875-125 MG tablet Take 1 tablet by mouth 2 (two) times daily for 10 days. 01/10/24 01/20/24 Yes Madisyn Mawhinney, Jodi R, NP  naproxen  (NAPROSYN ) 500 MG tablet Take 1 tablet (500 mg total) by mouth 2 (two) times daily as needed (dental pain). 01/10/24  Yes Shunte Senseney,  Jodi R, NP  cetirizine  (ZYRTEC  ALLERGY ) 10 MG tablet Take 1 tablet (10 mg total) by mouth daily. 07/23/23   Christopher Savannah, PA-C  drospirenone -ethinyl estradiol  (YAZ) 3-0.02 MG tablet Take 1 tablet by mouth daily. Patient not taking: Reported on 08/15/2023 06/05/23   Constant, Peggy, MD  EPINEPHrine  (EPIPEN  2-PAK) 0.3 mg/0.3 mL IJ SOAJ injection Inject 0.3 mg into the muscle as needed for anaphylaxis. 04/03/23   Iva Marty Saltness, MD  medroxyPROGESTERone  (DEPO-PROVERA ) 150 MG/ML injection INJECT 1 ML (150 MG TOTAL) INTO THE MUSCLE EVERY 3 (THREE) MONTHS 11/15/23   Littie Olam LABOR, NP  pseudoephedrine  (SUDAFED) 60 MG tablet Take 1 tablet (60 mg total) by mouth every 8 (eight) hours as needed for congestion. Patient not taking: Reported on 08/15/2023 07/23/23   Christopher Savannah, PA-C  triamcinolone  cream (KENALOG ) 0.1 % Apply 1 Application topically 2 (two) times daily. 09/10/23   Loreda Myla SAUNDERS, NP    Family History Family History  Problem Relation Age of Onset   Healthy Mother    Healthy Father    Hypertension Maternal Grandmother     Social History Social History   Tobacco Use  Smoking status: Never    Passive exposure: Past   Smokeless tobacco: Never  Vaping Use   Vaping status: Never Used  Substance Use Topics   Alcohol use: No   Drug use: No     Allergies   Shellfish allergy    Review of Systems Review of Systems  HENT:  Positive for dental problem.      Physical Exam Triage Vital Signs ED Triage Vitals  Encounter Vitals Group     BP 01/10/24 0848 110/75     Girls Systolic BP Percentile --      Girls Diastolic BP Percentile --      Boys Systolic BP Percentile --      Boys Diastolic BP Percentile --      Pulse Rate 01/10/24 0848 91     Resp 01/10/24 0848 16     Temp 01/10/24 0848 99 F (37.2 C)     Temp Source 01/10/24 0848 Oral     SpO2 01/10/24 0848 97 %     Weight --      Height --      Head Circumference --      Peak Flow --      Pain Score 01/10/24 0847 6      Pain Loc --      Pain Education --      Exclude from Growth Chart --    No data found.  Updated Vital Signs BP 110/75   Pulse 91   Temp 99 F (37.2 C) (Oral)   Resp 16   LMP 01/07/2024   SpO2 97%   Visual Acuity Right Eye Distance:   Left Eye Distance:   Bilateral Distance:    Right Eye Near:   Left Eye Near:    Bilateral Near:     Physical Exam Vitals and nursing note reviewed.  Constitutional:      General: She is not in acute distress.    Appearance: Normal appearance. She is not ill-appearing.  HENT:     Head: Normocephalic and atraumatic.     Mouth/Throat:      Comments: Several broken/decaying teeth to the right lower jaw.  There is swelling of the gumline Tooth 29 and 30.  No drainage.  Mild facial swelling without erythema. Eyes:     Pupils: Pupils are equal, round, and reactive to light.  Cardiovascular:     Rate and Rhythm: Normal rate.  Pulmonary:     Effort: Pulmonary effort is normal.  Skin:    General: Skin is warm and dry.  Neurological:     General: No focal deficit present.     Mental Status: She is alert and oriented to person, place, and time.  Psychiatric:        Mood and Affect: Mood normal.        Behavior: Behavior normal.      UC Treatments / Results  Labs (all labs ordered are listed, but only abnormal results are displayed) Labs Reviewed - No data to display  EKG   Radiology No results found.  Procedures Procedures (including critical care time)  Medications Ordered in UC Medications - No data to display  Initial Impression / Assessment and Plan / UC Course  I have reviewed the triage vital signs and the nursing notes.  Pertinent labs & imaging results that were available during my care of the patient were reviewed by me and considered in my medical decision making (see chart for details).     Reviewed exam  and symptoms with patient.  No red flags.  Will start Augmentin  and naproxen .  Patient was given resources for  dentist advised to follow-up as soon as possible as antibiotics are a temporary treatment for her underlying issue.  She may do salt water gargles as needed.  Strict ER precautions reviewed and patient verbalized understanding. Final Clinical Impressions(s) / UC Diagnoses   Final diagnoses:  Dental abscess     Discharge Instructions      Start Augmentin  twice daily for 10 days.  You may take naproxen  twice daily as needed for pain.  Do salt water gargles you may also take over-the-counter Tylenol  if needed.  Please follow-up with a dentist ASAP for further treatment as antibiotics are a temporary fix to the underlying issue.  Please go to the ER if you develop any worsening symptoms.  This includes but is not limited to fevers, worsening facial swelling, or any new concerns that arise.  I hope you feel better soon!    ED Prescriptions     Medication Sig Dispense Auth. Provider   amoxicillin -clavulanate (AUGMENTIN ) 875-125 MG tablet Take 1 tablet by mouth 2 (two) times daily for 10 days. 20 tablet Laranda Burkemper, Jodi R, NP   naproxen  (NAPROSYN ) 500 MG tablet Take 1 tablet (500 mg total) by mouth 2 (two) times daily as needed (dental pain). 14 tablet Garnet Chatmon, Jodi R, NP      PDMP not reviewed this encounter.   Loreda Myla SAUNDERS, NP 01/10/24 [phone]

## 2024-04-01 ENCOUNTER — Ambulatory Visit: Payer: Medicaid Other | Admitting: Allergy & Immunology

## 2024-08-31 ENCOUNTER — Ambulatory Visit: Admitting: Advanced Practice Midwife
